# Patient Record
Sex: Male | Born: 1935 | Race: White | Hispanic: No | Smoking: Former smoker
Health system: Southern US, Community
[De-identification: ages and names within clinical notes are randomized; demographics above are authoritative.]

## PROBLEM LIST (undated history)

## (undated) DIAGNOSIS — M1990 Unspecified osteoarthritis, unspecified site: Secondary | ICD-10-CM

## (undated) DIAGNOSIS — E7800 Pure hypercholesterolemia, unspecified: Secondary | ICD-10-CM

## (undated) DIAGNOSIS — M810 Age-related osteoporosis without current pathological fracture: Secondary | ICD-10-CM

## (undated) DIAGNOSIS — K219 Gastro-esophageal reflux disease without esophagitis: Secondary | ICD-10-CM

## (undated) DIAGNOSIS — E538 Deficiency of other specified B group vitamins: Secondary | ICD-10-CM

## (undated) DIAGNOSIS — E039 Hypothyroidism, unspecified: Secondary | ICD-10-CM

## (undated) DIAGNOSIS — I10 Essential (primary) hypertension: Secondary | ICD-10-CM

## (undated) HISTORY — PX: BACK SURGERY: SHX140

## (undated) HISTORY — DX: Gastro-esophageal reflux disease without esophagitis: K21.9

## (undated) HISTORY — DX: Deficiency of other specified B group vitamins: E53.8

## (undated) HISTORY — DX: Age-related osteoporosis without current pathological fracture: M81.0

---

## 2011-01-11 DIAGNOSIS — H35379 Puckering of macula, unspecified eye: Secondary | ICD-10-CM | POA: Diagnosis not present

## 2011-01-11 DIAGNOSIS — H2510 Age-related nuclear cataract, unspecified eye: Secondary | ICD-10-CM | POA: Diagnosis not present

## 2011-01-21 ENCOUNTER — Encounter (HOSPITAL_COMMUNITY): Payer: Self-pay | Admitting: Pharmacy Technician

## 2011-01-27 ENCOUNTER — Other Ambulatory Visit: Payer: Self-pay

## 2011-01-27 ENCOUNTER — Encounter (HOSPITAL_COMMUNITY)
Admission: RE | Admit: 2011-01-27 | Discharge: 2011-01-27 | Disposition: A | Discharge status: Home / Self Care | Payer: Medicare Other | Source: Ambulatory Visit | Attending: Ophthalmology | Admitting: Ophthalmology

## 2011-01-27 ENCOUNTER — Encounter (HOSPITAL_COMMUNITY): Payer: Self-pay

## 2011-01-27 DIAGNOSIS — I10 Essential (primary) hypertension: Secondary | ICD-10-CM | POA: Diagnosis not present

## 2011-01-27 DIAGNOSIS — Z01812 Encounter for preprocedural laboratory examination: Secondary | ICD-10-CM | POA: Diagnosis not present

## 2011-01-27 DIAGNOSIS — H2510 Age-related nuclear cataract, unspecified eye: Secondary | ICD-10-CM | POA: Diagnosis not present

## 2011-01-27 DIAGNOSIS — Z79899 Other long term (current) drug therapy: Secondary | ICD-10-CM | POA: Diagnosis not present

## 2011-01-27 HISTORY — DX: Unspecified osteoarthritis, unspecified site: M19.90

## 2011-01-27 HISTORY — DX: Pure hypercholesterolemia, unspecified: E78.00

## 2011-01-27 HISTORY — DX: Hypothyroidism, unspecified: E03.9

## 2011-01-27 HISTORY — DX: Essential (primary) hypertension: I10

## 2011-01-27 LAB — BASIC METABOLIC PANEL
BUN: 15 mg/dL (ref 6–23)
Calcium: 9.9 mg/dL (ref 8.4–10.5)
GFR calc Af Amer: 77 mL/min — ABNORMAL LOW (ref >90)
GFR calc non Af Amer: 66 mL/min — ABNORMAL LOW (ref >90)
Glucose, Bld: 116 mg/dL — ABNORMAL HIGH (ref 70–99)
Potassium: 4.2 mEq/L (ref 3.5–5.1)
Sodium: 139 mEq/L (ref 135–145)

## 2011-01-27 LAB — HEMOGLOBIN AND HEMATOCRIT, BLOOD: Hemoglobin: 13.8 g/dL (ref 13.0–17.0)

## 2011-01-27 NOTE — Patient Instructions (Addendum)
Cataract A cataract is a clouding of the lens of the eye. It is most often related to aging. A cataract is not a "film" over the surface of the eye. The lens is inside the eye and changes size of the pupil. The lens can enlarge to let more light enter the eye in dark environments and contract the size of the pupil to let in bright light. The lens is the part of the eye that helps focus light on the retina. The retina is the eye's light-sensitive layer. It is in the back of the eye that sends visual signals to the brain. In a normal eye, light passes through the lens and gets focused on the retina. To help produce a sharp image, the lens must remain clear. When a lens becomes cloudy, vision is compromised by the degree and nature of the clouding. Certain cataracts make people more near-sighted as they develop, others increase glare, and all reduce vision to some degree or another. A cataract that is so dense that it becomes milky white and a white opacity can be seen through the pupil. When the white color is seen, it is called a "mature" or "hyper-mature cataract." Such cataracts cause total blindness in the affected eye. The cataract must be removed to prevent damage to the eye itself. Some types of cataracts can cause a secondary disease of the eye, such as certain types of glaucoma. In the early stages, better lighting and eyeglasses may lessen vision problems caused by cataracts. At a certain point, surgery may be needed to improve vision. CAUSES   Aging. However, cataracts may occur at any age, even in newborns.   Certain drugs.   Trauma to the eye.   Certain diseases (such as diabetes).   Inherited or acquired medical syndromes.  SYMPTOMS   Gradual, progressive drop in vision in the affected eye. Cataracts may develop at different rates in each eye. Cataracts may even be in just one eye with the other unaffected.   Cataracts due to trauma may develop quickly, sometimes over a matter or days  or even hours. The result is severe and rapid visual loss.  DIAGNOSIS  To detect a cataract, an eye doctor examines the lens. A well developed cataract can be diagnosed without dilating the pupil. Early cataracts and others of a specific nature are best diagnosed with an exam of the eyes with the pupils dilated by drops. TREATMENT   For an early cataract, vision may improve by using different eyeglasses or stronger lighting.   If the above measures do not help, surgery is the only effective treatment. This treatment removes the cloudy lens and replaces it with a substitute lens (Intraocular lens, or IOL). Newly developed IOL technology allows the implanted lens to improve vision both at a distance and up close. Discuss with your eye surgeon about the possibility of still needing glasses. Also discuss how visual coordination between both eyes will be affected.  A cataract needs to be removed only when vision loss interferes with your everyday activities such as driving, reading or watching TV. You and your eye doctor can make that decision together. In most cases, waiting until you are ready to have cataract surgery will not harm your eye. If you have cataracts in both eyes, only one should be removed at a time. This allows the operated eye to heal and be out of danger from serious problems (such as infection or poor wound healing) before having the other eye undergo surgery.  Sometimes, a cataract should be removed even if it does not cause problems with your vision. For example, a cataract should be removed if it prevents examination or treatment of another eye problem. Just as you cannot see out of the affected eye well, your doctor cannot see into your eye well through a cataract. The vast majority of people who have cataract surgery have better vision afterward. CATARACT REMOVAL There are two primary ways to remove a cataract. Your doctor can explain the differences and help determine which is best  for you:  Phacoemulsification (small incision cataract surgery). This involves making a small cut (incision) on the edge of the clear, dome-shaped surface that covers the front of the eye (the cornea). An injection behind the eye or eye drops are given to make this a painless procedure. The doctor then inserts a tiny probe into the eye. This device emits ultrasound waves that soften and break up the cloudy center of the lens so it can be removed by suction. Most cataract surgery is done this way. The cuts are usually so small and performed in such a manner that often no sutures are needed to keep it closed.   Extracapsular surgery. Your doctor makes a slightly longer incision on the side of the cornea. The doctor removes the hard center of the lens. The remainder of the lens is then removed by suction. In some cases, extremely fine sutures are needed which the doctor may, or may not remove in the office after the surgery.  When an IOL is implanted, it needs no care. It becomes a permanent part of your eye and cannot be seen or felt.  Some people cannot have an IOL. They may have problems during surgery, or maybe they have another eye disease. For these people, a soft contact lens may be suggested. If an IOL or contact lens cannot be used, very powerful and thick glasses are required after surgery. Since vision is very different through such thick glasses, it is important to have your doctor discuss the impact on20 James Thomas  01/27/2011   Your procedure is scheduled on: 02/01/2011  Report to [facility] at  630  AM.  Call this number if you have problems the morning of surgery: [phone]   Remember:   Do not eat food:After Midnight.  May have clear liquids:until Midnight .  Clear liquids include soda, tea, black coffee, apple or grape juice, broth.  Take these medicines the morning of surgery with A SIP OF WATER: norvasc,lotensin,allegra,synthroid   Do not wear jewelry, make-up or nail  polish.  Do not wear lotions, powders, or perfumes. You may wear deodorant.  Do not shave 48 hours prior to surgery.  Do not bring valuables to the hospital.  Contacts, dentures or bridgework may not be worn into surgery.  Leave suitcase in the car. After surgery it may be brought to your room.  For patients admitted to the hospital, checkout time is 11:00 AM the day of discharge.   Patients discharged the day of surgery will not be allowed to drive home.  Name and phone number of your driver: family  Special Instructions: N/A   Please read over the following fact sheets that you were given: Pain Booklet, Surgical Site Infection Prevention, Anesthesia Post-op Instructions and Care and Recovery After Surgery  your vision after any cataract surgery where there is no plan to implant an IOL. The normal lens of the eye is covered by a clear capsule. Both phacoemulsification  and extracapsular surgery require that the back surface of this lens capsule be left in place. This helps support IOLs and prevents the IOL from dislocating and falling back into the deeper interior of the eye. Right after surgery, and often permanently this "posterior capsule" remains clear. In some cases however, it can become cloudy, presenting the same type of visual compromise that the original cataract did since light is again obstructed as it passes through the clear IOL. This condition is often referred to as an "after-cataract." Fortunately, after-cataracts are easily treated using a painless and very fast laser treatment that is performed without anesthesia or incisions. It is done in a matter of minutes in an outpatient environment. Visual improvement is often immediate.  HOME CARE INSTRUCTIONS   Your surgeon will discuss pre and post operative care with you prior to surgery. The majority of people are able to do almost all normal activities right away. Although, it is often advised to avoid strenuous activity for a period  of time.   Postoperative drops and careful avoidance of infection will be needed. Many surgeons suggest the use of a protective shield during the first few days after surgery.   There is a very small incidence of complication from modern cataract surgery, but it can happen. Infection that spreads to the inside of the eye (endophthalmitis) can result in total visual loss and even loss of the eye itself. In extremely rare instances, the inflammation of endophthalmitis can spread to both eyes (sympathetic ophthalmia). Appropriate post-operative care under the close observation of your surgeon is essential to a successful outcome.  SEEK IMMEDIATE MEDICAL CARE IF:   You have any sudden drop of vision in the operated eye.   You have pain in the operated eye.   You see a large number of floating dots in the field of vision in the operated eye.   You see flashing lights, or if a portion of your side vision in any direction appears black (like a curtain being drawn into your field of vision) in the operated eye.  Document Released: 12/21/2004 Document Revised: 09/02/2010 Document Reviewed: 02/06/2007 [facility] Patient Information 2012 [place], [place].PATIENT INSTRUCTIONS POST-ANESTHESIA  IMMEDIATELY FOLLOWING SURGERY:  Do not drive or operate machinery for the first twenty four hours after surgery.  Do not make any important decisions for twenty four hours after surgery or while taking narcotic pain medications or sedatives.  If you develop intractable nausea and vomiting or a severe headache please notify your doctor immediately.  FOLLOW-UP:  Please make an appointment with your surgeon as instructed. You do not need to follow up with anesthesia unless specifically instructed to do so.  WOUND CARE INSTRUCTIONS (if applicable):  Keep a dry clean dressing on the anesthesia/puncture wound site if there is drainage.  Once the wound has quit draining you may leave it open to air.  Generally you should leave  the bandage intact for twenty four hours unless there is drainage.  If the epidural site drains for more than 36-48 hours please call the anesthesia department.  QUESTIONS?:  Please feel free to call your physician or the hospital operator if you have any questions, and they will be happy to assist you.     [facility] Anesthesia Department [address] [place] [phone]

## 2011-01-29 MED ORDER — LIDOCAINE HCL 3.5 % OP GEL
OPHTHALMIC | Status: AC
  Filled 2011-01-29: qty 5

## 2011-01-29 MED ORDER — NEOMYCIN-POLYMYXIN-DEXAMETH 3.5-10000-0.1 OP OINT
TOPICAL_OINTMENT | OPHTHALMIC | Status: AC
  Filled 2011-01-29: qty 3.5

## 2011-01-29 MED ORDER — CYCLOPENTOLATE-PHENYLEPHRINE 0.2-1 % OP SOLN
OPHTHALMIC | Status: AC
  Filled 2011-01-29: qty 2

## 2011-01-29 MED ORDER — LIDOCAINE HCL (PF) 1 % IJ SOLN
INTRAMUSCULAR | Status: AC
  Filled 2011-01-29: qty 2

## 2011-01-29 MED ORDER — TETRACAINE HCL 0.5 % OP SOLN
OPHTHALMIC | Status: AC
  Filled 2011-01-29: qty 2

## 2011-02-01 ENCOUNTER — Encounter (HOSPITAL_COMMUNITY): Payer: Self-pay | Admitting: Anesthesiology

## 2011-02-01 ENCOUNTER — Encounter (HOSPITAL_COMMUNITY): Payer: Self-pay | Admitting: *Deleted

## 2011-02-01 ENCOUNTER — Ambulatory Visit (HOSPITAL_COMMUNITY)
Admission: RE | Admit: 2011-02-01 | Discharge: 2011-02-01 | Disposition: A | Discharge status: Home / Self Care | Payer: Medicare Other | Source: Ambulatory Visit | Attending: Ophthalmology | Admitting: Ophthalmology

## 2011-02-01 ENCOUNTER — Encounter (HOSPITAL_COMMUNITY)
Admission: RE | Disposition: A | Discharge status: Home / Self Care | Payer: Self-pay | Source: Ambulatory Visit | Attending: Ophthalmology

## 2011-02-01 ENCOUNTER — Ambulatory Visit (HOSPITAL_COMMUNITY): Payer: Medicare Other | Admitting: Anesthesiology

## 2011-02-01 DIAGNOSIS — Z01812 Encounter for preprocedural laboratory examination: Secondary | ICD-10-CM | POA: Insufficient documentation

## 2011-02-01 DIAGNOSIS — H269 Unspecified cataract: Secondary | ICD-10-CM | POA: Diagnosis not present

## 2011-02-01 DIAGNOSIS — I10 Essential (primary) hypertension: Secondary | ICD-10-CM | POA: Insufficient documentation

## 2011-02-01 DIAGNOSIS — H2510 Age-related nuclear cataract, unspecified eye: Secondary | ICD-10-CM | POA: Insufficient documentation

## 2011-02-01 DIAGNOSIS — Z79899 Other long term (current) drug therapy: Secondary | ICD-10-CM | POA: Diagnosis not present

## 2011-02-01 HISTORY — PX: CATARACT EXTRACTION W/PHACO: SHX586

## 2011-02-01 SURGERY — PHACOEMULSIFICATION, CATARACT, WITH IOL INSERTION
Anesthesia: Monitor Anesthesia Care | Site: Eye | Laterality: Right | Wound class: Clean

## 2011-02-01 MED ORDER — BSS IO SOLN
INTRAOCULAR | Status: DC | PRN
  Administered 2011-02-01: 30 mL via INTRAOCULAR

## 2011-02-01 MED ORDER — LIDOCAINE HCL 3.5 % OP GEL
1.0000 | Freq: Once | OPHTHALMIC | Status: AC
Start: 2011-02-01 — End: 2011-02-01
  Administered 2011-02-01: 1 via OPHTHALMIC

## 2011-02-01 MED ORDER — EPINEPHRINE HCL 1 MG/ML IJ SOLN
INTRAMUSCULAR | Status: AC
  Filled 2011-02-01: qty 1

## 2011-02-01 MED ORDER — POVIDONE-IODINE 5 % OP SOLN
OPHTHALMIC | Status: DC | PRN
  Administered 2011-02-01: 1 via OPHTHALMIC

## 2011-02-01 MED ORDER — PROVISC 10 MG/ML IO SOLN
INTRAOCULAR | Status: DC | PRN
  Administered 2011-02-01: .85 mL via INTRAOCULAR

## 2011-02-01 MED ORDER — LACTATED RINGERS IV SOLN
INTRAVENOUS | Status: DC
  Administered 2011-02-01: 1000 mL via INTRAVENOUS

## 2011-02-01 MED ORDER — LIDOCAINE 3.5 % OP GEL OPTIME - NO CHARGE
OPHTHALMIC | Status: DC | PRN
  Administered 2011-02-01: 1 [drp] via OPHTHALMIC

## 2011-02-01 MED ORDER — PHENYLEPHRINE HCL 2.5 % OP SOLN
1.0000 [drp] | OPHTHALMIC | Status: AC
  Administered 2011-02-01 (×3): 1 [drp] via OPHTHALMIC

## 2011-02-01 MED ORDER — PHENYLEPHRINE HCL 2.5 % OP SOLN
OPHTHALMIC | Status: AC
  Administered 2011-02-01: 1 [drp] via OPHTHALMIC
  Filled 2011-02-01: qty 2

## 2011-02-01 MED ORDER — CYCLOPENTOLATE-PHENYLEPHRINE 0.2-1 % OP SOLN
1.0000 [drp] | OPHTHALMIC | Status: AC
  Administered 2011-02-01 (×3): 1 [drp] via OPHTHALMIC

## 2011-02-01 MED ORDER — EPINEPHRINE HCL 1 MG/ML IJ SOLN
INTRAMUSCULAR | Status: DC | PRN
  Administered 2011-02-01: 08:00:00

## 2011-02-01 MED ORDER — MIDAZOLAM HCL 2 MG/2ML IJ SOLN
1.0000 mg | INTRAMUSCULAR | Status: DC | PRN
  Administered 2011-02-01: 2 mg via INTRAVENOUS

## 2011-02-01 MED ORDER — TETRACAINE HCL 0.5 % OP SOLN
1.0000 [drp] | OPHTHALMIC | Status: AC
  Administered 2011-02-01 (×3): 1 [drp] via OPHTHALMIC

## 2011-02-01 MED ORDER — NEOMYCIN-POLYMYXIN-DEXAMETH 0.1 % OP OINT
TOPICAL_OINTMENT | OPHTHALMIC | Status: DC | PRN
  Administered 2011-02-01: 1 via OPHTHALMIC

## 2011-02-01 MED ORDER — LIDOCAINE HCL (PF) 1 % IJ SOLN
INTRAMUSCULAR | Status: DC | PRN
  Administered 2011-02-01: .5 mL

## 2011-02-01 MED ORDER — MIDAZOLAM HCL 2 MG/2ML IJ SOLN
INTRAMUSCULAR | Status: AC
  Administered 2011-02-01: 2 mg via INTRAVENOUS
  Filled 2011-02-01: qty 2

## 2011-02-01 SURGICAL SUPPLY — 31 items
CAPSULAR TENSION RING-AMO (OPHTHALMIC RELATED) IMPLANT
CLOTH BEACON ORANGE TIMEOUT ST (SAFETY) ×1 IMPLANT
EYE SHIELD UNIVERSAL CLEAR (GAUZE/BANDAGES/DRESSINGS) ×1 IMPLANT
GLOVE BIO SURGEON STRL SZ 6.5 (GLOVE) IMPLANT
GLOVE BIOGEL PI IND STRL 6.5 (GLOVE) IMPLANT
GLOVE BIOGEL PI IND STRL 7.0 (GLOVE) IMPLANT
GLOVE BIOGEL PI IND STRL 7.5 (GLOVE) IMPLANT
GLOVE BIOGEL PI INDICATOR 6.5 (GLOVE)
GLOVE BIOGEL PI INDICATOR 7.0 (GLOVE) ×1
GLOVE BIOGEL PI INDICATOR 7.5 (GLOVE)
GLOVE ECLIPSE 6.5 STRL STRAW (GLOVE) IMPLANT
GLOVE ECLIPSE 7.0 STRL STRAW (GLOVE) IMPLANT
GLOVE ECLIPSE 7.5 STRL STRAW (GLOVE) IMPLANT
GLOVE EXAM NITRILE LRG STRL (GLOVE) IMPLANT
GLOVE EXAM NITRILE MD LF STRL (GLOVE) ×1 IMPLANT
GLOVE SKINSENSE NS SZ6.5 (GLOVE)
GLOVE SKINSENSE NS SZ7.0 (GLOVE)
GLOVE SKINSENSE STRL SZ6.5 (GLOVE) IMPLANT
GLOVE SKINSENSE STRL SZ7.0 (GLOVE) IMPLANT
KIT VITRECTOMY (OPHTHALMIC RELATED) IMPLANT
PAD ARMBOARD 7.5X6 YLW CONV (MISCELLANEOUS) ×1 IMPLANT
PROC W NO LENS (INTRAOCULAR LENS)
PROC W SPEC LENS (INTRAOCULAR LENS)
PROCESS W NO LENS (INTRAOCULAR LENS) IMPLANT
PROCESS W SPEC LENS (INTRAOCULAR LENS) IMPLANT
RING MALYGIN (MISCELLANEOUS) IMPLANT
SIGHTPATH CAT PROC W REG LENS (Ophthalmic Related) ×2 IMPLANT
SYR TB 1ML LL NO SAFETY (SYRINGE) ×1 IMPLANT
TAPE CLOTH 1X10 TAN NS (GAUZE/BANDAGES/DRESSINGS) ×1 IMPLANT
VISCOELASTIC ADDITIONAL (OPHTHALMIC RELATED) IMPLANT
WATER STERILE IRR 250ML POUR (IV SOLUTION) ×1 IMPLANT

## 2011-02-01 NOTE — H&P (Signed)
I have reviewed the H&P, the patient was re-examined, and I have identified no interval changes in medical condition and plan of care since the history and physical of record  

## 2011-02-01 NOTE — Op Note (Signed)
NAMECARLON, James Thomas               ACCOUNT NO.:  [id number]  MEDICAL RECORD NO.:  [id number]  LOCATION:  [facility]                          FACILITY:  [facility]  PHYSICIAN:  Susanne Greenhouse, MD       DATE OF BIRTH:  [birth year]  DATE OF PROCEDURE:  02/01/2011 DATE OF DISCHARGE:                              OPERATIVE REPORT   PREOPERATIVE DIAGNOSIS:  Nuclear cataract, right eye, diagnosis code 366.16.  POSTOPERATIVE DIAGNOSIS:  Nuclear cataract, right eye, diagnosis code 366.16.  OPERATION PERFORMED:  Phacoemulsification with posterior chamber intraocular lens implantation, right eye.  SURGEON:  Bonne Dolores. Mabel Roll, MD  ANESTHESIA:  General endotracheal anesthesia.  OPERATIVE SUMMARY:  In the preoperative area, dilating drops were placed into the right eye.  The patient was then brought into the operating room where he was placed under general anesthesia.  The eye was then prepped and draped.  Beginning with a 75 blade, a paracentesis port was made at the surgeon's 2 o'clock position.  The anterior chamber was then filled with a 1% nonpreserved lidocaine solution with epinephrine.  This was followed by Viscoat to deepen the chamber.  A small fornix-based peritomy was performed superiorly.  Next, a single iris hook was placed through the limbus superiorly.  A 2.4-mm keratome blade was then used to make a clear corneal incision over the iris hook.  A bent cystotome needle and Utrata forceps were used to create a continuous tear capsulotomy.  Hydrodissection was performed using balanced salt solution on a fine cannula.  The lens nucleus was then removed using phacoemulsification in a quadrant cracking technique.  The cortical material was then removed with irrigation and aspiration.  The capsular bag and anterior chamber were refilled with Provisc.  The wound was widened to approximately 3 mm and a posterior chamber intraocular lens was placed into the capsular bag without difficulty using an  [employer] lens injecting system.  A single 10-0 nylon suture was then used to close the incision as well as stromal hydration.  The Provisc was removed from the anterior chamber and capsular bag with irrigation and aspiration.  At this point, the wounds were tested for leak, which were negative.  The anterior chamber remained deep and stable.  The patient tolerated the procedure well.  There were no operative complications, and he awoke from general anesthesia without problem.  No surgical specimens.  Prosthetic device used is a [employer]'s enVista posterior chamber lens, model Softec HD, MX60, power of 21.5, serial number is [id number].          ______________________________ Susanne Greenhouse, MD     KEH/MEDQ  D:  02/01/2011  T:  02/01/2011  Job:  [id number]

## 2011-02-01 NOTE — Anesthesia Postprocedure Evaluation (Signed)
  Anesthesia Post-op Note  Patient: James Thomas  Procedure(s) Performed:  CATARACT EXTRACTION PHACO AND INTRAOCULAR LENS PLACEMENT (IOC) - CDE:18.53  Patient Location: PACU  Anesthesia Type: MAC  Level of Consciousness: awake, alert  and oriented  Airway and Oxygen Therapy: Patient Spontanous Breathing  Post-op Pain: none  Post-op Assessment: Post-op Vital signs reviewed, Patient's Cardiovascular Status Stable, Respiratory Function Stable and No signs of Nausea or vomiting  Post-op Vital Signs: Reviewed and stable  Complications: No apparent anesthesia complications

## 2011-02-01 NOTE — Anesthesia Preprocedure Evaluation (Signed)
Anesthesia Evaluation  Patient identified by MRN, date of birth, ID band Patient awake    Reviewed: Allergy & Precautions, H&P , NPO status , Patient's Chart, lab work & pertinent test results  History of Anesthesia Complications Negative for: history of anesthetic complications  Airway Mallampati: II      Dental  (+) Edentulous Upper   Pulmonary neg pulmonary ROS,  clear to auscultation        Cardiovascular hypertension, Pt. on medications Regular Normal    Neuro/Psych    GI/Hepatic negative GI ROS,   Endo/Other  Hypothyroidism   Renal/GU      Musculoskeletal   Abdominal   Peds  Hematology   Anesthesia Other Findings   Reproductive/Obstetrics                           Anesthesia Physical Anesthesia Plan  ASA: II  Anesthesia Plan: MAC   Post-op Pain Management:    Induction: Intravenous  Airway Management Planned: Nasal Cannula  Additional Equipment:   Intra-op Plan:   Post-operative Plan:   Informed Consent: I have reviewed the patients History and Physical, chart, labs and discussed the procedure including the risks, benefits and alternatives for the proposed anesthesia with the patient or authorized representative who has indicated his/her understanding and acceptance.     Plan Discussed with:   Anesthesia Plan Comments:         Anesthesia Quick Evaluation

## 2011-02-01 NOTE — Brief Op Note (Signed)
Pre-Op Dx: Cataract OD Post-Op Dx: Cataract OD Surgeon: Kimberlie Csaszar Anesthesia: Topical with MAC Implant: B&L enVista Blood Loss: None Specimen: None Complications: None 

## 2011-02-01 NOTE — Transfer of Care (Signed)
Immediate Anesthesia Transfer of Care Note  Patient: James Thomas  Procedure(s) Performed:  CATARACT EXTRACTION PHACO AND INTRAOCULAR LENS PLACEMENT (IOC) - CDE:18.53  Patient Location: PACU and Short Stay  Anesthesia Type: MAC  Level of Consciousness: awake, alert  and oriented  Airway & Oxygen Therapy: Patient Spontanous Breathing  Post-op Assessment: Report given to PACU RN  Post vital signs: Reviewed and stable  Complications: No apparent anesthesia complications

## 2011-02-04 ENCOUNTER — Encounter (HOSPITAL_COMMUNITY): Payer: Self-pay | Admitting: Ophthalmology

## 2011-02-18 DIAGNOSIS — E785 Hyperlipidemia, unspecified: Secondary | ICD-10-CM | POA: Diagnosis not present

## 2011-02-18 DIAGNOSIS — E039 Hypothyroidism, unspecified: Secondary | ICD-10-CM | POA: Diagnosis not present

## 2011-02-18 DIAGNOSIS — I10 Essential (primary) hypertension: Secondary | ICD-10-CM | POA: Diagnosis not present

## 2011-02-18 DIAGNOSIS — E559 Vitamin D deficiency, unspecified: Secondary | ICD-10-CM | POA: Diagnosis not present

## 2011-08-20 DIAGNOSIS — I10 Essential (primary) hypertension: Secondary | ICD-10-CM | POA: Diagnosis not present

## 2011-08-20 DIAGNOSIS — E785 Hyperlipidemia, unspecified: Secondary | ICD-10-CM | POA: Diagnosis not present

## 2011-09-10 DIAGNOSIS — L570 Actinic keratosis: Secondary | ICD-10-CM | POA: Diagnosis not present

## 2011-09-10 DIAGNOSIS — D485 Neoplasm of uncertain behavior of skin: Secondary | ICD-10-CM | POA: Diagnosis not present

## 2011-09-10 DIAGNOSIS — L988 Other specified disorders of the skin and subcutaneous tissue: Secondary | ICD-10-CM | POA: Diagnosis not present

## 2011-09-10 DIAGNOSIS — L259 Unspecified contact dermatitis, unspecified cause: Secondary | ICD-10-CM | POA: Diagnosis not present

## 2011-09-10 DIAGNOSIS — L821 Other seborrheic keratosis: Secondary | ICD-10-CM | POA: Diagnosis not present

## 2011-10-21 DIAGNOSIS — S83006A Unspecified dislocation of unspecified patella, initial encounter: Secondary | ICD-10-CM | POA: Diagnosis not present

## 2011-10-28 DIAGNOSIS — Z23 Encounter for immunization: Secondary | ICD-10-CM | POA: Diagnosis not present

## 2012-02-16 DIAGNOSIS — Z125 Encounter for screening for malignant neoplasm of prostate: Secondary | ICD-10-CM | POA: Diagnosis not present

## 2012-02-16 DIAGNOSIS — E039 Hypothyroidism, unspecified: Secondary | ICD-10-CM | POA: Diagnosis not present

## 2012-02-16 DIAGNOSIS — I10 Essential (primary) hypertension: Secondary | ICD-10-CM | POA: Diagnosis not present

## 2012-02-16 DIAGNOSIS — E785 Hyperlipidemia, unspecified: Secondary | ICD-10-CM | POA: Diagnosis not present

## 2012-02-16 DIAGNOSIS — E559 Vitamin D deficiency, unspecified: Secondary | ICD-10-CM | POA: Diagnosis not present

## 2012-03-13 DIAGNOSIS — L259 Unspecified contact dermatitis, unspecified cause: Secondary | ICD-10-CM | POA: Diagnosis not present

## 2012-03-13 DIAGNOSIS — D485 Neoplasm of uncertain behavior of skin: Secondary | ICD-10-CM | POA: Diagnosis not present

## 2012-03-13 DIAGNOSIS — L738 Other specified follicular disorders: Secondary | ICD-10-CM | POA: Diagnosis not present

## 2012-03-13 DIAGNOSIS — L570 Actinic keratosis: Secondary | ICD-10-CM | POA: Diagnosis not present

## 2012-03-30 ENCOUNTER — Encounter: Payer: Self-pay | Admitting: *Deleted

## 2012-08-17 ENCOUNTER — Ambulatory Visit (INDEPENDENT_AMBULATORY_CARE_PROVIDER_SITE_OTHER): Payer: Medicare Other | Admitting: Family Medicine

## 2012-08-17 ENCOUNTER — Encounter: Payer: Self-pay | Admitting: Family Medicine

## 2012-08-17 ENCOUNTER — Ambulatory Visit: Payer: Self-pay | Admitting: General Practice

## 2012-08-17 VITALS — BP 142/77 | HR 70 | Temp 97.1°F | Ht 72.5 in | Wt 178.0 lb

## 2012-08-17 DIAGNOSIS — M810 Age-related osteoporosis without current pathological fracture: Secondary | ICD-10-CM | POA: Diagnosis not present

## 2012-08-17 DIAGNOSIS — J309 Allergic rhinitis, unspecified: Secondary | ICD-10-CM | POA: Diagnosis not present

## 2012-08-17 DIAGNOSIS — I10 Essential (primary) hypertension: Secondary | ICD-10-CM | POA: Diagnosis not present

## 2012-08-17 DIAGNOSIS — E785 Hyperlipidemia, unspecified: Secondary | ICD-10-CM

## 2012-08-17 DIAGNOSIS — J302 Other seasonal allergic rhinitis: Secondary | ICD-10-CM | POA: Insufficient documentation

## 2012-08-17 DIAGNOSIS — C44221 Squamous cell carcinoma of skin of unspecified ear and external auricular canal: Secondary | ICD-10-CM

## 2012-08-17 DIAGNOSIS — C44229 Squamous cell carcinoma of skin of left ear and external auricular canal: Secondary | ICD-10-CM | POA: Insufficient documentation

## 2012-08-17 DIAGNOSIS — E039 Hypothyroidism, unspecified: Secondary | ICD-10-CM | POA: Diagnosis not present

## 2012-08-17 NOTE — Patient Instructions (Signed)
Hypertriglyceridemia  Diet for High blood levels of Triglycerides Most fats in food are triglycerides. Triglycerides in your blood are stored as fat in your body. High levels of triglycerides in your blood may put you at a greater risk for heart disease and stroke.  Normal triglyceride levels are less than 150 mg/dL. Borderline high levels are 150-199 mg/dl. High levels are 200 - 499 mg/dL, and very high triglyceride levels are greater than 500 mg/dL. The decision to treat high triglycerides is generally based on the level. For people with borderline or high triglyceride levels, treatment includes weight loss and exercise. Drugs are recommended for people with very high triglyceride levels. Many people who need treatment for high triglyceride levels have metabolic syndrome. This syndrome is a collection of disorders that often include: insulin resistance, high blood pressure, blood clotting problems, high cholesterol and triglycerides. TESTING PROCEDURE FOR TRIGLYCERIDES  You should not eat 4 hours before getting your triglycerides measured. The normal range of triglycerides is between 10 and 250 milligrams per deciliter (mg/dl). Some people may have extreme levels (1000 or above), but your triglyceride level may be too high if it is above 150 mg/dl, depending on what other risk factors you have for heart disease.  People with high blood triglycerides may also have high blood cholesterol levels. If you have high blood cholesterol as well as high blood triglycerides, your risk for heart disease is probably greater than if you only had high triglycerides. High blood cholesterol is one of the main risk factors for heart disease. CHANGING YOUR DIET  Your weight can affect your blood triglyceride level. If you are more than 20% above your ideal body weight, you may be able to lower your blood triglycerides by losing weight. Eating less and exercising regularly is the best way to combat this. Fat provides more  calories than any other food. The best way to lose weight is to eat less fat. Only 30% of your total calories should come from fat. Less than 7% of your diet should come from saturated fat. A diet low in fat and saturated fat is the same as a diet to decrease blood cholesterol. By eating a diet lower in fat, you may lose weight, lower your blood cholesterol, and lower your blood triglyceride level.  Eating a diet low in fat, especially saturated fat, may also help you lower your blood triglyceride level. Ask your dietitian to help you figure how much fat you can eat based on the number of calories your caregiver has prescribed for you.  Exercise, in addition to helping with weight loss may also help lower triglyceride levels.   Alcohol can increase blood triglycerides. You may need to stop drinking alcoholic beverages.  Too much carbohydrate in your diet may also increase your blood triglycerides. Some complex carbohydrates are necessary in your diet. These may include bread, rice, potatoes, other starchy vegetables and cereals.  Reduce "simple" carbohydrates. These may include pure sugars, candy, honey, and jelly without losing other nutrients. If you have the kind of high blood triglycerides that is affected by the amount of carbohydrates in your diet, you will need to eat less sugar and less high-sugar foods. Your caregiver can help you with this.  Adding 2-4 grams of fish oil (EPA+ DHA) may also help lower triglycerides. Speak with your caregiver before adding any supplements to your regimen. Following the Diet  Maintain your ideal weight. Your caregivers can help you with a diet. Generally, eating less food and getting more   exercise will help you lose weight. Joining a weight control group may also help. Ask your caregivers for a good weight control group in your area.  Eat low-fat foods instead of high-fat foods. This can help you lose weight too.  These foods are lower in fat. Eat MORE of these:    Dried beans, peas, and lentils.  Egg whites.  Low-fat cottage cheese.  Fish.  Lean cuts of meat, such as round, sirloin, rump, and flank (cut extra fat off meat you fix).  Whole grain breads, cereals and pasta.  Skim and nonfat dry milk.  Low-fat yogurt.  Poultry without the skin.  Cheese made with skim or part-skim milk, such as mozzarella, parmesan, farmers', ricotta, or pot cheese. These are higher fat foods. Eat LESS of these:   Whole milk and foods made from whole milk, such as American, blue, cheddar, monterey jack, and swiss cheese  High-fat meats, such as luncheon meats, sausages, knockwurst, bratwurst, hot dogs, ribs, corned beef, ground pork, and regular ground beef.  Fried foods. Limit saturated fats in your diet. Substituting unsaturated fat for saturated fat may decrease your blood triglyceride level. You will need to read package labels to know which products contain saturated fats.  These foods are high in saturated fat. Eat LESS of these:   Fried pork skins.  Whole milk.  Skin and fat from poultry.  Palm oil.  Butter.  Shortening.  Cream cheese.  Bacon.  Margarines and baked goods made from listed oils.  Vegetable shortenings.  Chitterlings.  Fat from meats.  Coconut oil.  Palm kernel oil.  Lard.  Cream.  Sour cream.  Fatback.  Coffee whiteners and non-dairy creamers made with these oils.  Cheese made from whole milk. Use unsaturated fats (both polyunsaturated and monounsaturated) moderately. Remember, even though unsaturated fats are better than saturated fats; you still want a diet low in total fat.  These foods are high in unsaturated fat:   Canola oil.  Sunflower oil.  Mayonnaise.  Almonds.  Peanuts.  Pine nuts.  Margarines made with these oils.  Safflower oil.  Olive oil.  Avocados.  Cashews.  Peanut butter.  Sunflower seeds.  Soybean oil.  Peanut  oil.  Olives.  Pecans.  Walnuts.  Pumpkin seeds. Avoid sugar and other high-sugar foods. This will decrease carbohydrates without decreasing other nutrients. Sugar in your food goes rapidly to your blood. When there is excess sugar in your blood, your liver may use it to make more triglycerides. Sugar also contains calories without other important nutrients.  Eat LESS of these:   Sugar, brown sugar, powdered sugar, jam, jelly, preserves, honey, syrup, molasses, pies, candy, cakes, cookies, frosting, pastries, colas, soft drinks, punches, fruit drinks, and regular gelatin.  Avoid alcohol. Alcohol, even more than sugar, may increase blood triglycerides. In addition, alcohol is high in calories and low in nutrients. Ask for sparkling water, or a diet soft drink instead of an alcoholic beverage. Suggestions for planning and preparing meals   Bake, broil, grill or roast meats instead of frying.  Remove fat from meats and skin from poultry before cooking.  Add spices, herbs, lemon juice or vinegar to vegetables instead of salt, rich sauces or gravies.  Use a non-stick skillet without fat or use no-stick sprays.  Cool and refrigerate stews and broth. Then remove the hardened fat floating on the surface before serving.  Refrigerate meat drippings and skim off fat to make low-fat gravies.  Serve more fish.  Use less butter,   margarine and other high-fat spreads on bread or vegetables.  Use skim or reconstituted non-fat dry milk for cooking.  Cook with low-fat cheeses.  Substitute low-fat yogurt or cottage cheese for all or part of the sour cream in recipes for sauces, dips or congealed salads.  Use half yogurt/half mayonnaise in salad recipes.  Substitute evaporated skim milk for cream. Evaporated skim milk or reconstituted non-fat dry milk can be whipped and substituted for whipped cream in certain recipes.  Choose fresh fruits for dessert instead of high-fat foods such as pies or  cakes. Fruits are naturally low in fat. When Dining Out   Order low-fat appetizers such as fruit or vegetable juice, pasta with vegetables or tomato sauce.  Select clear, rather than cream soups.  Ask that dressings and gravies be served on the side. Then use less of them.  Order foods that are baked, broiled, poached, steamed, stir-fried, or roasted.  Ask for margarine instead of butter, and use only a small amount.  Drink sparkling water, unsweetened tea or coffee, or diet soft drinks instead of alcohol or other sweet beverages. QUESTIONS AND ANSWERS ABOUT OTHER FATS IN THE BLOOD: SATURATED FAT, TRANS FAT, AND CHOLESTEROL What is trans fat? Trans fat is a type of fat that is formed when vegetable oil is hardened through a process called hydrogenation. This process helps makes foods more solid, gives them shape, and prolongs their shelf life. Trans fats are also called hydrogenated or partially hydrogenated oils.  What do saturated fat, trans fat, and cholesterol in foods have to do with heart disease? Saturated fat, trans fat, and cholesterol in the diet all raise the level of LDL "bad" cholesterol in the blood. The higher the LDL cholesterol, the greater the risk for coronary heart disease (CHD). Saturated fat and trans fat raise LDL similarly.  What foods contain saturated fat, trans fat, and cholesterol? High amounts of saturated fat are found in animal products, such as fatty cuts of meat, chicken skin, and full-fat dairy products like butter, whole milk, cream, and cheese, and in tropical vegetable oils such as palm, palm kernel, and coconut oil. Trans fat is found in some of the same foods as saturated fat, such as vegetable shortening, some margarines (especially hard or stick margarine), crackers, cookies, baked goods, fried foods, salad dressings, and other processed foods made with partially hydrogenated vegetable oils. Small amounts of trans fat also occur naturally in some animal  products, such as milk products, beef, and lamb. Foods high in cholesterol include liver, other organ meats, egg yolks, shrimp, and full-fat dairy products. How can I use the new food label to make heart-healthy food choices? Check the Nutrition Facts panel of the food label. Choose foods lower in saturated fat, trans fat, and cholesterol. For saturated fat and cholesterol, you can also use the Percent Daily Value (%DV): 5% DV or less is low, and 20% DV or more is high. (There is no %DV for trans fat.) Use the Nutrition Facts panel to choose foods low in saturated fat and cholesterol, and if the trans fat is not listed, read the ingredients and limit products that list shortening or hydrogenated or partially hydrogenated vegetable oil, which tend to be high in trans fat. POINTS TO REMEMBER:   Discuss your risk for heart disease with your caregivers, and take steps to reduce risk factors.  Change your diet. Choose foods that are low in saturated fat, trans fat, and cholesterol.  Add exercise to your daily routine if   it is not already being done. Participate in physical activity of moderate intensity, like brisk walking, for at least 30 minutes on most, and preferably all days of the week. No time? Break the 30 minutes into three, 10-minute segments during the day.  Stop smoking. If you do smoke, contact your caregiver to discuss ways in which they can help you quit.  Do not use street drugs.  Maintain a normal weight.  Maintain a healthy blood pressure.  Keep up with your blood work for checking the fats in your blood as directed by your caregiver. Document Released: 10/09/2003 Document Revised: 06/22/2011 Document Reviewed: 05/06/2008 [facility] Patient Information 2014 [place], [place].   Hypertension As your heart beats, it forces blood through your arteries. This force is your blood pressure. If the pressure is too high, it is called hypertension (HTN) or high blood pressure. HTN is  dangerous because you may have it and not know it. High blood pressure may mean that your heart has to work harder to pump blood. Your arteries may be narrow or stiff. The extra work puts you at risk for heart disease, stroke, and other problems.  Blood pressure consists of two numbers, a higher number over a lower, 110/72, for example. It is stated as "110 over 72." The ideal is below 120 for the top number (systolic) and under 80 for the bottom (diastolic). Write down your blood pressure today. You should pay close attention to your blood pressure if you have certain conditions such as:  Heart failure.  Prior heart attack.  Diabetes  Chronic kidney disease.  Prior stroke.  Multiple risk factors for heart disease. To see if you have HTN, your blood pressure should be measured while you are seated with your arm held at the level of the heart. It should be measured at least twice. A one-time elevated blood pressure reading (especially in the Emergency Department) does not mean that you need treatment. There may be conditions in which the blood pressure is different between your right and left arms. It is important to see your caregiver soon for a recheck. Most people have essential hypertension which means that there is not a specific cause. This type of high blood pressure may be lowered by changing lifestyle factors such as:  Stress.  Smoking.  Lack of exercise.  Excessive weight.  Drug/tobacco/alcohol use.  Eating less salt. Most people do not have symptoms from high blood pressure until it has caused damage to the body. Effective treatment can often prevent, delay or reduce that damage. TREATMENT  When a cause has been identified, treatment for high blood pressure is directed at the cause. There are a large number of medications to treat HTN. These fall into several categories, and your caregiver will help you select the medicines that are best for you. Medications may have side  effects. You should review side effects with your caregiver. If your blood pressure stays high after you have made lifestyle changes or started on medicines,   Your medication(s) may need to be changed.  Other problems may need to be addressed.  Be certain you understand your prescriptions, and know how and when to take your medicine.  Be sure to follow up with your caregiver within the time frame advised (usually within two weeks) to have your blood pressure rechecked and to review your medications.  If you are taking more than one medicine to lower your blood pressure, make sure you know how and at what times they should  be taken. Taking two medicines at the same time can result in blood pressure that is too low. SEEK IMMEDIATE MEDICAL CARE IF:  You develop a severe headache, blurred or changing vision, or confusion.  You have unusual weakness or numbness, or a faint feeling.  You have severe chest or abdominal pain, vomiting, or breathing problems. MAKE SURE YOU:   Understand these instructions.  Will watch your condition.  Will get help right away if you are not doing well or get worse. Document Released: 12/21/2004 Document Revised: 03/15/2011 Document Reviewed: 08/11/2007 [facility] Patient Information 2014 [place], [place].     Colorectal Cancer Screening Colorectal cancer screening is done to detect early disease. Colorectal refers to the colon and rectum. The colon and rectum are located at the end of the large intestine (digestive system), and carry your bowel movements out of the body. Screening may be done even if you are not experiencing symptoms.  Colorectal cancer screening checks for:  Polyps. These are small growths in the lining of the colon that can turn cancerous.  Cancer that is already growing. Cancer is a cluster of abnormal cells that can cause problems in the body. REASONS FOR COLORECTAL CANCER SCREENING  It is common for polyps to form in the lining of  the colon, especially in older people. These polyps can be cancerous or become cancerous.  Caught early, colorectal cancer is treatable.  Cancer can be life threatening. Detecting or preventing cancer early can save your life and allow you to enjoy life longer. TYPES OF SCREENING  Fecal occult blood testing. A stool sample is examined for blood in the laboratory.  Sigmoidoscopy. A sigmoidoscope is used to examine the rectum and lower colon. A sigmoidoscope is a flexible tube with a camera that is inserted through your anus to examine your lower rectum.  Colonoscopy. The longer colonoscope is used to examine the entire colon. A colonoscope is also a thin, flexible tube with a camera. This test examines the colon and rectum. Other tests include:  Digital rectal exam.  Barium enema.  Stool DNA test.  Virtual colonoscopy is the use of computerized X-ray scan (computed tomography, CT) to take X-ray images of your colon. WHO SHOULD HAVE COLORECTAL CANCER SCREENING?  Screening is recommended for all adults aged 23 to 75 years.  Screening is generally done every 5 to 10 years or more frequently if you have a family history or symptoms.  Screening is rarely recommended in adults aged 72 to 85 years. Screening is not recommended in adults aged 61 years and older. Your caregiver may recommend screening at a younger age and more frequent screening if you have:  A history of colorectal cancer or polyps.  Family members with histories of colorectal cancer or polyps.  Inflammatory bowel disease, such as ulcerative colitis or Crohn's disease.  A type of hereditary colon cancer syndrome. Talk with your caregiver about any symptoms, personal and family history. SYMPTOMS OF COLORECTAL CANCER It is important to discuss the following symptoms with your caregiver. These symptoms may be the result of other conditions and may be easily treated:  Rectal bleeding.  Blood in your stool.  Changes in  bowel movements (hard or loose stools). These changes may last several weeks.  Abdominal cramping.  Feeling the pressure to have a bowel movement when there is no bowel movement.  Feeling tired or weak.  Unexplained weight loss.  Unexplained low red blood cell count. This may also be called iron deficiency anemia. HOME CARE  INSTRUCTIONS   Follow up with your caregiver as directed.  Follow all instructions for preparation before your test as well as after. PREVENTION  Following healthy lifestyle habits each day can reduce your chance of getting colorectal cancer and many other types of cancer:  Eat a healthy, well-balanced diet rich in fruits and vegetables and low in fats, sugars and cholesterol.  Stay active. Try to exercise at least 4 to 6 times per week for 30 minutes.  Maintain a healthy weight. Ask your caregiver what a healthy weight range is for you.  Women should only drink 1 alcoholic drink per day. Men should only drink 2 alcoholic drinks per day.  Quit smoking. SEEK MEDICAL CARE IF:   You experience abdominal or rectal symptoms (see Symptoms of Colorectal Cancer).  Your gastrointestinal issues (constipation, diarrhea) do not go away as expected.  You have questions or concerns. FOR MORE INFORMATION  American Academy of Family Physicians www.familydoctor.org  Centers for Disease Control and Prevention [employer]  [place] Preventive Services Task Force www.uspreventiveservicestaskforce.org  American Cancer Society www.cancer.org MAKE SURE YOU:   Understand these instructions.  Will watch your condition.  Will get help right away if you are not doing well or get worse. Always follow up with your caregiver to find out the results of your tests. Not all test results may be available during your visit. If your test results are not back during the visit, make an appointment with your caregiver to find out the results. Do not assume everything is normal if you have  not heard from your caregiver or the medical facility. It is important for you to follow up on all of your test results.  Document Released: 06/10/2009 Document Revised: 03/15/2011 Document Reviewed: 06/10/2009 [facility] Patient Information 2014 [place], [place].

## 2012-08-17 NOTE — Progress Notes (Signed)
Patient ID: James Thomas, male   DOB: 08/27/1934, 78 y.o.   MRN: [id number] SUBJECTIVE: CC: Chief Complaint  Patient presents with  . Follow-up    6 month follow up chronic problems  . Medication Refill    needs all refills     HPI: Patient is here for follow up of hyperlipidemia: denies Headache;denies Chest Pain;denies weakness;denies Shortness of Breath and orthopnea;denies Visual changes;denies palpitations;denies cough;denies pedal edema;denies symptoms of TIA or stroke;deniesClaudication symptoms. admits to Compliance with medications; denies Problems with medications.  Still works doing Environmental health practitioner work and  Restorations.   Past Medical History  Diagnosis Date  . Hypercholesteremia   . Hypothyroidism   . Hypertension   . Arthritis   . Osteoporosis    Past Surgical History  Procedure Laterality Date  . Back surgery  1979;1985    slipped disc;ruptured disc  . Cataract extraction w/phaco  02/01/2011    Procedure: CATARACT EXTRACTION PHACO AND INTRAOCULAR LENS PLACEMENT (IOC);  Surgeon: Gemma Payor, MD;  Location: [facility];  Service: Ophthalmology;  Laterality: Right;  CDE:18.53   History   Social History  . Marital Status: Married    Spouse Name: N/A    Number of Children: N/A  . Years of Education: N/A   Occupational History  . Not on file.   Social History Main Topics  . Smoking status: Former Smoker -- 1.00 packs/day for 10 years    Types: Cigarettes    Quit date: 01/27/1972  . Smokeless tobacco: Not on file  . Alcohol Use: No  . Drug Use: No  . Sexual Activity: No   Other Topics Concern  . Not on file   Social History Narrative  . No narrative on file   Family History  Problem Relation Age of Onset  . Anesthesia problems Neg Hx   . Hypotension Neg Hx   . Malignant hyperthermia Neg Hx   . Pseudochol deficiency Neg Hx    Current Outpatient Prescriptions on File Prior to Visit  Medication Sig Dispense Refill  . amLODipine (NORVASC) 10 MG tablet  Take 10 mg by mouth daily.      . benazepril (LOTENSIN) 20 MG tablet Take 20 mg by mouth daily.      . calcium citrate-vitamin D (CITRACAL+D) 315-200 MG-UNIT per tablet Take 1 tablet by mouth 2 (two) times daily.      . cholecalciferol (VITAMIN D) 1000 UNITS tablet Take 1,000 Units by mouth daily.      . fenofibrate (TRICOR) 145 MG tablet Take 145 mg by mouth daily.      . fexofenadine (ALLEGRA) 180 MG tablet Take 180 mg by mouth daily.      Marland Kitchen levothyroxine (SYNTHROID, LEVOTHROID) 50 MCG tablet Take 50 mcg by mouth daily.      . meloxicam (MOBIC) 15 MG tablet Take 7.5 mg by mouth daily.      . Omega-3 Fatty Acids (FISH OIL) 1200 MG CAPS Take 2 capsules by mouth daily.      . pravastatin (PRAVACHOL) 80 MG tablet Take 80 mg by mouth at bedtime.       No current facility-administered medications on file prior to visit.   Allergies  Allergen Reactions  . Niacin And Related Itching and Rash   Immunization History  Administered Date(s) Administered  . Tdap 08/18/2010  . Zoster 10/05/2006   Prior to Admission medications   Medication Sig Start Date End Date Taking? Authorizing Provider  amLODipine (NORVASC) 10 MG tablet Take 10 mg by  mouth daily.   Yes Historical Provider, MD  benazepril (LOTENSIN) 20 MG tablet Take 20 mg by mouth daily.   Yes Historical Provider, MD  calcium citrate-vitamin D (CITRACAL+D) 315-200 MG-UNIT per tablet Take 1 tablet by mouth 2 (two) times daily.   Yes Historical Provider, MD  cholecalciferol (VITAMIN D) 1000 UNITS tablet Take 1,000 Units by mouth daily.   Yes Historical Provider, MD  fenofibrate (TRICOR) 145 MG tablet Take 145 mg by mouth daily.   Yes Historical Provider, MD  fexofenadine (ALLEGRA) 180 MG tablet Take 180 mg by mouth daily.   Yes Historical Provider, MD  levothyroxine (SYNTHROID, LEVOTHROID) 50 MCG tablet Take 50 mcg by mouth daily.   Yes Historical Provider, MD  meloxicam (MOBIC) 15 MG tablet Take 7.5 mg by mouth daily.   Yes Historical  Provider, MD  Omega-3 Fatty Acids (FISH OIL) 1200 MG CAPS Take 2 capsules by mouth daily.   Yes Historical Provider, MD  pravastatin (PRAVACHOL) 80 MG tablet Take 80 mg by mouth at bedtime.   Yes Historical Provider, MD     ROS: As above in the HPI. All other systems are stable or negative.  OBJECTIVE: APPEARANCE:  Patient in no acute distress.The patient appeared well nourished and normally developed. Acyanotic. Waist: VITAL SIGNS:BP 142/77  Pulse 70  Temp(Src) 97.1 F (36.2 C) (Oral)  Ht 6' 0.5" (1.842 m)  Wt 178 lb (80.74 kg)  BMI 23.8 kg/m2 Elderly WM looks well for age.  SKIN: warm and  Dry without overt rashes, tattoos and scars  HEAD and Neck: without JVD, Head and scalp: normal Eyes:No scleral icterus. Fundi normal, eye movements normal. Ears: Auricle normal, canal normal, Tympanic membranes normal, insufflation normal. Nose: normal Throat: normal Neck & thyroid: normal  CHEST & LUNGS: Chest wall: normal Lungs: Clear  CVS: Reveals the PMI to be normally located. Regular rhythm, First and Second Heart sounds are normal,  absence of murmurs, rubs or gallops. Peripheral vasculature: Radial pulses: normal Dorsal pedis pulses: normal Posterior pulses: normal  ABDOMEN:  Appearance: normal Benign, no organomegaly, no masses, no Abdominal Aortic enlargement. No Guarding , no rebound. No Bruits. Bowel sounds: normal  RECTAL: N/A GU: N/A  EXTREMETIES: nonedematous. Both Femoral and Pedal pulses are normal.  MUSCULOSKELETAL:  Spine: normal Joints: intact  NEUROLOGIC: oriented to time,place and person; nonfocal. Strength is normal Sensory is normal Reflexes are normal Cranial Nerves are normal.  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 CMP14+EGFR  . NMR, lipoprofile  . TSH  . Vitamin D 25 hydroxy    Handouts on colon cancer screens , hyperlipidemia given in the AVS. Wellness discussion. Patient reluctant for any procedures.declines colon cancer screen. He has a history of osteoporosis and  Decline DEXA follow up  Same medications for now. Await results.  Patient declines the pneumovax until he comes in NOvember for the flushot. Return in about 4 months (around 12/17/2012) for Recheck medical problems.  Naleah Kofoed P. [place] Charon, M.D.

## 2012-08-19 LAB — CMP14+EGFR
ALT: 14 IU/L (ref 0–44)
AST: 20 IU/L (ref 0–40)
Albumin/Globulin Ratio: 2.2 (ref 1.1–2.5)
Albumin: 4.8 g/dL (ref 3.5–4.8)
Alkaline Phosphatase: 55 IU/L (ref 39–117)
BUN/Creatinine Ratio: 11 (ref 10–22)
BUN: 12 mg/dL (ref 8–27)
CO2: 24 mmol/L (ref 18–29)
Calcium: 9.7 mg/dL (ref 8.6–10.2)
Chloride: 104 mmol/L (ref 97–108)
Creatinine, Ser: 1.11 mg/dL (ref 0.76–1.27)
GFR calc Af Amer: 74 mL/min/{1.73_m2} (ref >59)
GFR calc non Af Amer: 64 mL/min/{1.73_m2} (ref >59)
Globulin, Total: 2.2 g/dL (ref 1.5–4.5)
Glucose: 99 mg/dL (ref 65–99)
Potassium: 5.1 mmol/L (ref 3.5–5.2)
Sodium: 143 mmol/L (ref 134–144)
Total Bilirubin: 0.6 mg/dL (ref 0.0–1.2)
Total Protein: 7 g/dL (ref 6.0–8.5)

## 2012-08-19 LAB — NMR, LIPOPROFILE
Cholesterol: 167 mg/dL (ref <200)
HDL Cholesterol by NMR: 44 mg/dL (ref >=40)
HDL Particle Number: 37.1 umol/L (ref >=30.5)
LDL Particle Number: 1722 nmol/L — ABNORMAL HIGH (ref <1000)
LDL Size: 21 nm (ref >20.5)
LDLC SERPL CALC-MCNC: 103 mg/dL — ABNORMAL HIGH (ref <100)
LP-IR Score: 57 — ABNORMAL HIGH (ref <=45)
Small LDL Particle Number: 931 nmol/L — ABNORMAL HIGH (ref <=527)
Triglycerides by NMR: 98 mg/dL (ref <150)

## 2012-08-19 LAB — VITAMIN D 25 HYDROXY (VIT D DEFICIENCY, FRACTURES): Vit D, 25-Hydroxy: 26.9 ng/mL — ABNORMAL LOW (ref 30.0–100.0)

## 2012-08-19 LAB — TSH: TSH: 2.68 u[IU]/mL (ref 0.450–4.500)

## 2012-09-13 DIAGNOSIS — L570 Actinic keratosis: Secondary | ICD-10-CM | POA: Diagnosis not present

## 2012-09-13 DIAGNOSIS — Z85828 Personal history of other malignant neoplasm of skin: Secondary | ICD-10-CM | POA: Diagnosis not present

## 2012-10-20 ENCOUNTER — Encounter (INDEPENDENT_AMBULATORY_CARE_PROVIDER_SITE_OTHER): Payer: Self-pay

## 2012-10-20 ENCOUNTER — Ambulatory Visit: Payer: Medicare Other | Admitting: *Deleted

## 2012-10-20 ENCOUNTER — Other Ambulatory Visit (INDEPENDENT_AMBULATORY_CARE_PROVIDER_SITE_OTHER): Payer: Medicare Other | Admitting: *Deleted

## 2012-10-20 DIAGNOSIS — Z23 Encounter for immunization: Secondary | ICD-10-CM

## 2012-12-21 ENCOUNTER — Ambulatory Visit (INDEPENDENT_AMBULATORY_CARE_PROVIDER_SITE_OTHER): Payer: Medicare Other | Admitting: Family Medicine

## 2012-12-21 ENCOUNTER — Encounter: Payer: Self-pay | Admitting: Family Medicine

## 2012-12-21 VITALS — BP 139/82 | HR 74 | Temp 97.0°F | Ht 72.5 in | Wt 177.5 lb

## 2012-12-21 DIAGNOSIS — E785 Hyperlipidemia, unspecified: Secondary | ICD-10-CM | POA: Diagnosis not present

## 2012-12-21 DIAGNOSIS — I10 Essential (primary) hypertension: Secondary | ICD-10-CM | POA: Diagnosis not present

## 2012-12-21 DIAGNOSIS — E039 Hypothyroidism, unspecified: Secondary | ICD-10-CM

## 2012-12-21 DIAGNOSIS — M810 Age-related osteoporosis without current pathological fracture: Secondary | ICD-10-CM

## 2012-12-21 DIAGNOSIS — C44229 Squamous cell carcinoma of skin of left ear and external auricular canal: Secondary | ICD-10-CM

## 2012-12-21 DIAGNOSIS — E559 Vitamin D deficiency, unspecified: Secondary | ICD-10-CM

## 2012-12-21 DIAGNOSIS — C44221 Squamous cell carcinoma of skin of unspecified ear and external auricular canal: Secondary | ICD-10-CM

## 2012-12-21 MED ORDER — FENOFIBRATE 145 MG PO TABS: 145.0000 mg | ORAL_TABLET | Freq: Every day | ORAL | Status: DC

## 2012-12-21 MED ORDER — LEVOTHYROXINE SODIUM 50 MCG PO TABS: 50.0000 ug | ORAL_TABLET | Freq: Every day | ORAL | Status: DC

## 2012-12-21 MED ORDER — BENAZEPRIL HCL 20 MG PO TABS: 20.0000 mg | ORAL_TABLET | Freq: Every day | ORAL | Status: DC

## 2012-12-21 MED ORDER — PRAVASTATIN SODIUM 80 MG PO TABS: 80.0000 mg | ORAL_TABLET | Freq: Every day | ORAL | Status: DC

## 2012-12-21 MED ORDER — AMLODIPINE BESYLATE 10 MG PO TABS: 10.0000 mg | ORAL_TABLET | Freq: Every day | ORAL | Status: DC

## 2012-12-21 NOTE — Patient Instructions (Signed)
DASH Diet The DASH diet stands for "Dietary Approaches to Stop Hypertension." It is a healthy eating plan that has been shown to reduce high blood pressure (hypertension) in as little as 14 days, while also possibly providing other significant health benefits. These other health benefits include reducing the risk of breast cancer after menopause and reducing the risk of type 2 diabetes, heart disease, colon cancer, and stroke. Health benefits also include weight loss and slowing kidney failure in patients with chronic kidney disease.  DIET GUIDELINES  Limit salt (sodium). Your diet should contain less than 1500 mg of sodium daily.  Limit refined or processed carbohydrates. Your diet should include mostly whole grains. Desserts and added sugars should be used sparingly.  Include small amounts of heart-healthy fats. These types of fats include nuts, oils, and tub margarine. Limit saturated and trans fats. These fats have been shown to be harmful in the body. CHOOSING FOODS  The following food groups are based on a 2000 calorie diet. See your Registered Dietitian for individual calorie needs. Grains and Grain Products (6 to 8 servings daily)  Eat More Often: Whole-wheat bread, brown rice, whole-grain or wheat pasta, quinoa, popcorn without added fat or salt (air popped).  Eat Less Often: White bread, white pasta, white rice, cornbread. Vegetables (4 to 5 servings daily)  Eat More Often: Fresh, frozen, and canned vegetables. Vegetables may be raw, steamed, roasted, or grilled with a minimal amount of fat.  Eat Less Often/Avoid: Creamed or fried vegetables. Vegetables in a cheese sauce. Fruit (4 to 5 servings daily)  Eat More Often: All fresh, canned (in natural juice), or frozen fruits. Dried fruits without added sugar. One hundred percent fruit juice ( cup [237 mL] daily).  Eat Less Often: Dried fruits with added sugar. Canned fruit in light or heavy syrup. Lean Meats, Fish, and Poultry (2  servings or less daily. One serving is 3 to 4 oz [85-114 g]).  Eat More Often: Ninety percent or leaner ground beef, tenderloin, sirloin. Round cuts of beef, chicken breast, turkey breast. All fish. Grill, bake, or broil your meat. Nothing should be fried.  Eat Less Often/Avoid: Fatty cuts of meat, turkey, or chicken leg, thigh, or wing. Fried cuts of meat or fish. Dairy (2 to 3 servings)  Eat More Often: Low-fat or fat-free milk, low-fat plain or light yogurt, reduced-fat or part-skim cheese.  Eat Less Often/Avoid: Milk (whole, 2%).Whole milk yogurt. Full-fat cheeses. Nuts, Seeds, and Legumes (4 to 5 servings per week)  Eat More Often: All without added salt.  Eat Less Often/Avoid: Salted nuts and seeds, canned beans with added salt. Fats and Sweets (limited)  Eat More Often: Vegetable oils, tub margarines without trans fats, sugar-free gelatin. Mayonnaise and salad dressings.  Eat Less Often/Avoid: Coconut oils, palm oils, butter, stick margarine, cream, half and half, cookies, candy, pie. FOR MORE INFORMATION The Dash Diet Eating Plan: www.dashdiet.org Document Released: 12/10/2010 Document Revised: 03/15/2011 Document Reviewed: 12/10/2010 Exit[employer] Patient Information 2014 [employer].    Hypercholesterolemia High Blood Cholesterol Cholesterol is a white, waxy, fat-like protein needed by your body in small amounts. The liver makes all the cholesterol you need. It is carried from the liver by the blood through the blood vessels. Deposits (plaque) may build up on blood vessel walls. This makes the arteries narrower and stiffer. Plaque increases the risk for heart attack and stroke. You cannot feel your cholesterol level even if it is very high. The only way to know is by a blood   test to check your lipid (fats) levels. Once you know your cholesterol levels, you should keep a record of the test results. Work with your caregiver to to keep your levels in the desired range. WHAT THE  RESULTS MEAN:  Total cholesterol is a rough measure of all the cholesterol in your blood.  LDL is the so-called bad cholesterol. This is the type that deposits cholesterol in the walls of the arteries. You want this level to be low.  HDL is the good cholesterol because it cleans the arteries and carries the LDL away. You want this level to be high.  Triglycerides are fat that the body can either burn for energy or store. High levels are closely linked to heart disease. DESIRED LEVELS:  Total cholesterol below 200.  LDL below 100 for people at risk, below 70 for very high risk.  HDL above 50 is good, above 60 is best.  Triglycerides below 150. HOW TO LOWER YOUR CHOLESTEROL:  Diet.  Choose fish or white meat chicken and turkey, roasted or baked. Limit fatty cuts of red meat, fried foods, and processed meats, such as sausage and lunch meat.  Eat lots of fresh fruits and vegetables. Choose whole grains, beans, pasta, potatoes and cereals.  Use only small amounts of olive, corn or canola oils. Avoid butter, mayonnaise, shortening or palm kernel oils. Avoid foods with trans-fats.  Use skim/nonfat milk and low-fat/nonfat yogurt and cheeses. Avoid whole milk, cream, ice cream, egg yolks and cheeses. Healthy desserts include angel food cake, gingersnaps, animal crackers, hard candy, popsicles, and low-fat/nonfat frozen yogurt. Avoid pastries, cakes, pies and cookies.  Exercise.  A regular program helps decrease LDL and raises HDL.  Helps with weight control.  Do things that increase your activity level like gardening, walking, or taking the stairs.  Medication.  May be prescribed by your caregiver to help lowering cholesterol and the risk for heart disease.  You may need medicine even if your levels are normal if you have several risk factors. HOME CARE INSTRUCTIONS   Follow your diet and exercise programs as suggested by your caregiver.  Take medications as directed.  Have  blood work done when your caregiver feels it is necessary. MAKE SURE YOU:   Understand these instructions.  Will watch your condition.  Will get help right away if you are not doing well or get worse. Document Released: 12/21/2004 Document Revised: 03/15/2011 Document Reviewed: 06/08/2006 Exit[employer] Patient Information 2014 [employer].  

## 2012-12-21 NOTE — Progress Notes (Signed)
Patient ID: James Thomas, male   DOB: Mar 18, 1934, 78 y.o.   MRN: [id number] SUBJECTIVE: CC: Chief Complaint  Patient presents with  . Follow-up    CHRONIC HEALTH REVIEW    HPI:   Patient is here for follow up of hyperlipidemia/HTN/Thyroid: denies Headache;denies Chest Pain;denies weakness;denies Shortness of Breath and orthopnea;denies Visual changes;denies palpitations;denies cough;denies pedal edema;denies symptoms of TIA or stroke;deniesClaudication symptoms. admits to Compliance with medications; denies Problems with medications.  Left knee worn out but it doesn't bother him much  Mostly vegetables. Past Medical History  Diagnosis Date  . Hypercholesteremia   . Hypothyroidism   . Hypertension   . Arthritis   . Osteoporosis    Past Surgical History  Procedure Laterality Date  . Back surgery  1979;1985    slipped disc;ruptured disc  . Cataract extraction w/phaco  02/01/2011    Procedure: CATARACT EXTRACTION PHACO AND INTRAOCULAR LENS PLACEMENT (IOC);  Surgeon: Gemma Payor, MD;  Location: [facility];  Service: Ophthalmology;  Laterality: Right;  CDE:18.53   History   Social History  . Marital Status: Married    Spouse Name: N/A    Number of Children: N/A  . Years of Education: N/A   Occupational History  . Not on file.   Social History Main Topics  . Smoking status: Former Smoker -- 1.00 packs/day for 10 years    Types: Cigarettes    Quit date: 01/27/1972  . Smokeless tobacco: Not on file  . Alcohol Use: No  . Drug Use: No  . Sexual Activity: No   Other Topics Concern  . Not on file   Social History Narrative  . No narrative on file   Family History  Problem Relation Age of Onset  . Anesthesia problems Neg Hx   . Hypotension Neg Hx   . Malignant hyperthermia Neg Hx   . Pseudochol deficiency Neg Hx    Current Outpatient Prescriptions on File Prior to Visit  Medication Sig Dispense Refill  . calcium citrate-vitamin D (CITRACAL+D) 315-200 MG-UNIT per  tablet Take 1 tablet by mouth 2 (two) times daily.      . cholecalciferol (VITAMIN D) 1000 UNITS tablet Take 1,000 Units by mouth daily.      . fexofenadine (ALLEGRA) 180 MG tablet Take 180 mg by mouth daily.      . Omega-3 Fatty Acids (FISH OIL) 1200 MG CAPS Take 2 capsules by mouth daily.       No current facility-administered medications on file prior to visit.   Allergies  Allergen Reactions  . Meloxicam     Unsure but thinks it gave him a rash  . Niacin And Related Itching and Rash   Immunization History  Administered Date(s) Administered  . Influenza,inj,Quad PF,36+ Mos 10/20/2012  . Pneumococcal Polysaccharide-23 10/20/2012  . Tdap 08/18/2010  . Zoster 10/05/2006   Prior to Admission medications   Medication Sig Start Date End Date Taking? Authorizing Provider  amLODipine (NORVASC) 10 MG tablet Take 10 mg by mouth daily.    Historical Provider, MD  benazepril (LOTENSIN) 20 MG tablet Take 20 mg by mouth daily.    Historical Provider, MD  calcium citrate-vitamin D (CITRACAL+D) 315-200 MG-UNIT per tablet Take 1 tablet by mouth 2 (two) times daily.    Historical Provider, MD  cholecalciferol (VITAMIN D) 1000 UNITS tablet Take 1,000 Units by mouth daily.    Historical Provider, MD  fenofibrate (TRICOR) 145 MG tablet Take 145 mg by mouth daily.    Historical Provider, MD  fexofenadine (ALLEGRA) 180 MG tablet Take 180 mg by mouth daily.    Historical Provider, MD  levothyroxine (SYNTHROID, LEVOTHROID) 50 MCG tablet Take 50 mcg by mouth daily.    Historical Provider, MD  meloxicam (MOBIC) 15 MG tablet Take 7.5 mg by mouth daily.    Historical Provider, MD  Omega-3 Fatty Acids (FISH OIL) 1200 MG CAPS Take 2 capsules by mouth daily.    Historical Provider, MD  pravastatin (PRAVACHOL) 80 MG tablet Take 80 mg by mouth at bedtime.    Historical Provider, MD     ROS: As above in the HPI. All other systems are stable or negative.  OBJECTIVE: APPEARANCE:  Patient in no acute  distress.The patient appeared well nourished and normally developed. Acyanotic. Waist: VITAL SIGNS:BP 139/82  Pulse 74  Temp(Src) 97 F (36.1 C) (Oral)  Ht 6' 0.5" (1.842 m)  Wt 177 lb 8 oz (80.513 kg)  BMI 23.73 kg/m2  WM Elderly. Looks good for age.  SKIN: warm and  Dry without overt rashes, tattoos and scars  HEAD and Neck: without JVD, Head and scalp: normal Eyes:No scleral icterus. Fundi normal, eye movements normal. Ears: Auricle normal, canal normal, Tympanic membranes normal, insufflation normal. Nose: normal Throat: normal Neck & thyroid: normal  CHEST & LUNGS: Chest wall: normal Lungs: Clear  CVS: Reveals the PMI to be normally located. Regular rhythm, First and Second Heart sounds are normal,  absence of murmurs, rubs or gallops. Peripheral vasculature: Radial pulses: normal Dorsal pedis pulses: normal Posterior pulses: normal  ABDOMEN:  Appearance: normal Benign, no organomegaly, no masses, no Abdominal Aortic enlargement. No Guarding , no rebound. No Bruits. Bowel sounds: normal  RECTAL: N/A GU: N/A  EXTREMETIES: nonedematous.  MUSCULOSKELETAL:  Spine: normal Joints: left knee crepitus  NEUROLOGIC: oriented to time,place and person; nonfocal. Strength is normal Sensory is normal Reflexes are normal Cranial Nerves are normal.  Results for orders placed in visit on 08/17/12  CMP14+EGFR      Result Value Range   Glucose 99  65 - 99 mg/dL   BUN 12  8 - 27 mg/dL   Creatinine, Ser 4.09  0.76 - 1.27 mg/dL   GFR calc non Af Amer 64  >59 mL/min/1.73   GFR calc Af Amer 74  >59 mL/min/1.73   BUN/Creatinine Ratio 11  10 - 22   Sodium 143  134 - 144 mmol/L   Potassium 5.1  3.5 - 5.2 mmol/L   Chloride 104  97 - 108 mmol/L   CO2 24  18 - 29 mmol/L   Calcium 9.7  8.6 - 10.2 mg/dL   Total Protein 7.0  6.0 - 8.5 g/dL   Albumin 4.8  3.5 - 4.8 g/dL   Globulin, Total 2.2  1.5 - 4.5 g/dL   Albumin/Globulin Ratio 2.2  1.1 - 2.5   Total Bilirubin 0.6   0.0 - 1.2 mg/dL   Alkaline Phosphatase 55  39 - 117 IU/L   AST 20  0 - 40 IU/L   ALT 14  0 - 44 IU/L  NMR, LIPOPROFILE      Result Value Range   LDL Particle Number [id number] (*) <1000 nmol/L   LDLC SERPL CALC-MCNC 103 (*) <100 mg/dL   HDL Cholesterol by NMR 44  >=40 mg/dL   Triglycerides by NMR 98  <150 mg/dL   Cholesterol 811  <914 mg/dL   HDL Particle Number [id number]  >=95.6 umol/L   Small LDL Particle Number [id number] (*) <=527 nmol/L   LDL  Size 21.0  >20.5 nm   LP-IR Score 57 (*) <=45  TSH      Result Value Range   TSH 2.680  0.450 - 4.500 uIU/mL  VITAMIN D 25 HYDROXY      Result Value Range   Vit D, 25-Hydroxy 26.9 (*) 30.0 - 100.0 ng/mL     ASSESSMENT:  HLD (hyperlipidemia) - Plan: CMP14+EGFR, Lipid panel, pravastatin (PRAVACHOL) 80 MG tablet, fenofibrate (TRICOR) 145 MG tablet  HTN (hypertension) - Plan: benazepril (LOTENSIN) 20 MG tablet, amLODipine (NORVASC) 10 MG tablet  Hypothyroid - Plan: TSH, levothyroxine (SYNTHROID, LEVOTHROID) 50 MCG tablet  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 In the AVS Keep active as he is doing and healthy diet which he apparently has.  Same medications for now Await lab work  Orders Placed This Encounter  Procedures  . CMP14+EGFR  . Lipid panel  . Vit D  25 hydroxy (rtn osteoporosis monitoring)  . TSH   Meds ordered this encounter  Medications  . pravastatin (PRAVACHOL) 80 MG tablet    Sig: Take 1 tablet (80 mg total) by mouth at bedtime.    Dispense:  30 tablet    Refill:  11  . levothyroxine (SYNTHROID, LEVOTHROID) 50 MCG tablet    Sig: Take 1 tablet (50 mcg total) by mouth daily.    Dispense:  30 tablet    Refill:  11  . fenofibrate (TRICOR) 145 MG tablet    Sig: Take 1 tablet (145 mg total) by mouth daily.    Dispense:  30 tablet    Refill:   11  . benazepril (LOTENSIN) 20 MG tablet    Sig: Take 1 tablet (20 mg total) by mouth daily.    Dispense:  30 tablet    Refill:  11  . amLODipine (NORVASC) 10 MG tablet    Sig: Take 1 tablet (10 mg total) by mouth daily.    Dispense:  30 tablet    Refill:  11   Medications Discontinued During This Encounter  Medication Reason  . meloxicam (MOBIC) 15 MG tablet Allergic reaction  . pravastatin (PRAVACHOL) 80 MG tablet Reorder  . levothyroxine (SYNTHROID, LEVOTHROID) 50 MCG tablet Reorder  . fenofibrate (TRICOR) 145 MG tablet Reorder  . benazepril (LOTENSIN) 20 MG tablet Reorder  . amLODipine (NORVASC) 10 MG tablet Reorder   Return in about 4 months (around 04/21/2013) for Recheck medical problems.  Dawn Convery P. [place] Charon, M.D.

## 2012-12-22 LAB — LIPID PANEL
Chol/HDL Ratio: 3.7 ratio units (ref 0.0–5.0)
Cholesterol, Total: 161 mg/dL (ref 100–199)
HDL: 44 mg/dL (ref >39)
LDL Calculated: 100 mg/dL — ABNORMAL HIGH (ref 0–99)
Triglycerides: 83 mg/dL (ref 0–149)
VLDL Cholesterol Cal: 17 mg/dL (ref 5–40)

## 2012-12-22 LAB — CMP14+EGFR
ALT: 14 IU/L (ref 0–44)
AST: 24 IU/L (ref 0–40)
Albumin/Globulin Ratio: 2 (ref 1.1–2.5)
Albumin: 4.6 g/dL (ref 3.5–4.8)
Alkaline Phosphatase: 54 IU/L (ref 39–117)
BUN/Creatinine Ratio: 12 (ref 10–22)
BUN: 14 mg/dL (ref 8–27)
CO2: 24 mmol/L (ref 18–29)
Calcium: 9.7 mg/dL (ref 8.6–10.2)
Chloride: 103 mmol/L (ref 97–108)
Creatinine, Ser: 1.14 mg/dL (ref 0.76–1.27)
GFR calc Af Amer: 72 mL/min/{1.73_m2} (ref >59)
GFR calc non Af Amer: 62 mL/min/{1.73_m2} (ref >59)
Globulin, Total: 2.3 g/dL (ref 1.5–4.5)
Glucose: 106 mg/dL — ABNORMAL HIGH (ref 65–99)
Potassium: 4.1 mmol/L (ref 3.5–5.2)
Sodium: 143 mmol/L (ref 134–144)
Total Bilirubin: 0.5 mg/dL (ref 0.0–1.2)
Total Protein: 6.9 g/dL (ref 6.0–8.5)

## 2012-12-22 LAB — VITAMIN D 25 HYDROXY (VIT D DEFICIENCY, FRACTURES): Vit D, 25-Hydroxy: 36.3 ng/mL (ref 30.0–100.0)

## 2012-12-22 LAB — TSH: TSH: 3.23 u[IU]/mL (ref 0.450–4.500)

## 2013-03-05 ENCOUNTER — Telehealth: Payer: Self-pay | Admitting: Family Medicine

## 2013-03-05 DIAGNOSIS — E785 Hyperlipidemia, unspecified: Secondary | ICD-10-CM

## 2013-03-05 DIAGNOSIS — E039 Hypothyroidism, unspecified: Secondary | ICD-10-CM

## 2013-03-05 MED ORDER — PRAVASTATIN SODIUM 80 MG PO TABS: 80.0000 mg | ORAL_TABLET | Freq: Every day | ORAL | Status: DC

## 2013-03-05 MED ORDER — LEVOTHYROXINE SODIUM 50 MCG PO TABS: 50.0000 ug | ORAL_TABLET | Freq: Every day | ORAL | Status: DC

## 2013-03-05 NOTE — Telephone Encounter (Signed)
done

## 2013-04-09 ENCOUNTER — Telehealth: Payer: Self-pay | Admitting: Family Medicine

## 2013-04-09 DIAGNOSIS — I10 Essential (primary) hypertension: Secondary | ICD-10-CM

## 2013-04-11 MED ORDER — BENAZEPRIL HCL 20 MG PO TABS: 20.0000 mg | ORAL_TABLET | Freq: Every day | ORAL | Status: DC

## 2013-04-11 MED ORDER — AMLODIPINE BESYLATE 10 MG PO TABS: 10.0000 mg | ORAL_TABLET | Freq: Every day | ORAL | Status: DC

## 2013-04-11 NOTE — Telephone Encounter (Signed)
Done pt aware °

## 2013-04-12 ENCOUNTER — Other Ambulatory Visit: Payer: Self-pay | Admitting: *Deleted

## 2013-04-12 DIAGNOSIS — E785 Hyperlipidemia, unspecified: Secondary | ICD-10-CM

## 2013-04-12 MED ORDER — FENOFIBRATE 145 MG PO TABS: 145.0000 mg | ORAL_TABLET | Freq: Every day | ORAL | Status: DC

## 2013-05-08 ENCOUNTER — Encounter: Payer: Self-pay | Admitting: Family Medicine

## 2013-05-08 ENCOUNTER — Ambulatory Visit (INDEPENDENT_AMBULATORY_CARE_PROVIDER_SITE_OTHER): Payer: Medicare Other | Admitting: Family Medicine

## 2013-05-08 VITALS — BP 150/73 | HR 70 | Temp 97.6°F | Ht 72.0 in | Wt 182.0 lb

## 2013-05-08 DIAGNOSIS — I10 Essential (primary) hypertension: Secondary | ICD-10-CM

## 2013-05-08 DIAGNOSIS — C189 Malignant neoplasm of colon, unspecified: Secondary | ICD-10-CM

## 2013-05-08 DIAGNOSIS — C44229 Squamous cell carcinoma of skin of left ear and external auricular canal: Secondary | ICD-10-CM

## 2013-05-08 DIAGNOSIS — C44221 Squamous cell carcinoma of skin of unspecified ear and external auricular canal: Secondary | ICD-10-CM

## 2013-05-08 DIAGNOSIS — M810 Age-related osteoporosis without current pathological fracture: Secondary | ICD-10-CM

## 2013-05-08 DIAGNOSIS — J302 Other seasonal allergic rhinitis: Secondary | ICD-10-CM

## 2013-05-08 DIAGNOSIS — J309 Allergic rhinitis, unspecified: Secondary | ICD-10-CM

## 2013-05-08 DIAGNOSIS — E785 Hyperlipidemia, unspecified: Secondary | ICD-10-CM

## 2013-05-08 DIAGNOSIS — E039 Hypothyroidism, unspecified: Secondary | ICD-10-CM

## 2013-05-08 NOTE — Progress Notes (Signed)
Patient ID: James Thomas, male   DOB: [birth year], 79 y.o.   MRN: [id number] SUBJECTIVE: CC: Chief Complaint  Patient presents with  . Hypothyroidism    4 month recheck  . Hypertension  . Hyperlipidemia    HPI: Patient is here for follow up of hyperlipidemia/HTN: denies Headache;denies Chest Pain;denies weakness;denies Shortness of Breath and orthopnea;denies Visual changes;denies palpitations;denies cough;denies pedal edema;denies symptoms of TIA or stroke;deniesClaudication symptoms. admits to Compliance with medications; denies Problems with medications.  Neck crackles and pops and some nights the left arm is numb, but not all the time.  No complaints. Feels well and is very active.  Past Medical History  Diagnosis Date  . Hypercholesteremia   . Hypothyroidism   . Hypertension   . Arthritis   . Osteoporosis    Past Surgical History  Procedure Laterality Date  . Back surgery  1979;1985    slipped disc;ruptured disc  . Cataract extraction w/phaco  02/01/2011    Procedure: CATARACT EXTRACTION PHACO AND INTRAOCULAR LENS PLACEMENT (IOC);  Surgeon: Tonny Branch, MD;  Location: [facility];  Service: Ophthalmology;  Laterality: Right;  CDE:18.53   History   Social History  . Marital Status: Married    Spouse Name: N/A    Number of Children: N/A  . Years of Education: N/A   Occupational History  . Not on file.   Social History Main Topics  . Smoking status: Former Smoker -- 1.00 packs/day for 10 years    Types: Cigarettes    Quit date: 01/27/1972  . Smokeless tobacco: Not on file  . Alcohol Use: No  . Drug Use: No  . Sexual Activity: No   Other Topics Concern  . Not on file   Social History Narrative  . No narrative on file   Family History  Problem Relation Age of Onset  . Anesthesia problems Neg Hx   . Hypotension Neg Hx   . Malignant hyperthermia Neg Hx   . Pseudochol deficiency Neg Hx    Current Outpatient Prescriptions on File Prior to Visit  Medication  Sig Dispense Refill  . amLODipine (NORVASC) 10 MG tablet Take 1 tablet (10 mg total) by mouth daily.  90 tablet  2  . benazepril (LOTENSIN) 20 MG tablet Take 1 tablet (20 mg total) by mouth daily.  90 tablet  2  . calcium citrate-vitamin D (CITRACAL+D) 315-200 MG-UNIT per tablet Take 1 tablet by mouth 2 (two) times daily.      . cholecalciferol (VITAMIN D) 1000 UNITS tablet Take 1,000 Units by mouth daily.      . fenofibrate (TRICOR) 145 MG tablet Take 1 tablet (145 mg total) by mouth daily.  90 tablet  2  . fexofenadine (ALLEGRA) 180 MG tablet Take 180 mg by mouth daily.      Marland Kitchen levothyroxine (SYNTHROID, LEVOTHROID) 50 MCG tablet Take 1 tablet (50 mcg total) by mouth daily.  90 tablet  0  . Omega-3 Fatty Acids (FISH OIL) 1200 MG CAPS Take 2 capsules by mouth daily.      . pravastatin (PRAVACHOL) 80 MG tablet Take 1 tablet (80 mg total) by mouth at bedtime.  90 tablet  0   No current facility-administered medications on file prior to visit.   Allergies  Allergen Reactions  . Meloxicam     Unsure but thinks it gave him a rash  . Niacin And Related Itching and Rash   Immunization History  Administered Date(s) Administered  . Influenza,inj,Quad PF,36+ Mos 10/20/2012  . Pneumococcal  Polysaccharide-23 10/20/2012  . Tdap 08/18/2010  . Zoster 10/05/2006   Prior to Admission medications   Medication Sig Start Date End Date Taking? Authorizing Provider  amLODipine (NORVASC) 10 MG tablet Take 1 tablet (10 mg total) by mouth daily. 04/11/13  Yes Chipper Herb, MD  benazepril (LOTENSIN) 20 MG tablet Take 1 tablet (20 mg total) by mouth daily. 04/11/13  Yes Chipper Herb, MD  calcium citrate-vitamin D (CITRACAL+D) 315-200 MG-UNIT per tablet Take 1 tablet by mouth 2 (two) times daily.   Yes Historical Provider, MD  cholecalciferol (VITAMIN D) 1000 UNITS tablet Take 1,000 Units by mouth daily.   Yes Historical Provider, MD  fenofibrate (TRICOR) 145 MG tablet Take 1 tablet (145 mg total) by mouth daily.  04/12/13  Yes Vernie Shanks, MD  fexofenadine (ALLEGRA) 180 MG tablet Take 180 mg by mouth daily.   Yes Historical Provider, MD  levothyroxine (SYNTHROID, LEVOTHROID) 50 MCG tablet Take 1 tablet (50 mcg total) by mouth daily. 03/05/13  Yes Vernie Shanks, MD  Omega-3 Fatty Acids (FISH OIL) 1200 MG CAPS Take 2 capsules by mouth daily.   Yes Historical Provider, MD  pravastatin (PRAVACHOL) 80 MG tablet Take 1 tablet (80 mg total) by mouth at bedtime. 03/05/13  Yes Vernie Shanks, MD  triamcinolone cream (KENALOG) 0.1 %  03/05/13   Historical Provider, MD     ROS: As above in the HPI. All other systems are stable or negative.  OBJECTIVE: APPEARANCE:  Patient in no acute distress.The patient appeared well nourished and normally developed. Acyanotic. Waist: VITAL SIGNS:BP 150/73  Pulse 70  Temp(Src) 97.6 F (36.4 C) (Oral)  Ht 6' (1.829 m)  Wt 182 lb (82.555 kg)  BMI 24.68 kg/m2 Well appearing WM  SKIN: warm and  Dry without overt rashes, tattoos and scars  HEAD and Neck: without JVD, Head and scalp: normal Eyes:No scleral icterus. Fundi normal, eye movements normal. Ears: Auricle normal, canal normal, Tympanic membranes normal, insufflation normal. Nose: normal Throat: normal Neck & thyroid: normal  CHEST & LUNGS: Chest wall: normal Lungs: Clear  CVS: Reveals the PMI to be normally located. Regular rhythm, First and Second Heart sounds are normal,  absence of murmurs, rubs or gallops. Peripheral vasculature: Radial pulses: normal Dorsal pedis pulses: normal Posterior pulses: normal  ABDOMEN:  Appearance: normal Benign, no organomegaly, no masses, no Abdominal Aortic enlargement. No Guarding , no rebound. No Bruits. Bowel sounds: normal  RECTAL: N/A GU: N/A  EXTREMETIES: nonedematous.  MUSCULOSKELETAL:  Spine: normal Joints: intact  NEUROLOGIC: oriented to time,place and person; nonfocal. Strength is normal Sensory is normal Reflexes are normal Cranial Nerves  are normal.  Results for orders placed in visit on 12/21/12  CMP14+EGFR      Result Value Ref Range   Glucose 106 (*) 65 - 99 mg/dL   BUN 14  8 - 27 mg/dL   Creatinine, Ser 1.14  0.76 - 1.27 mg/dL   GFR calc non Af Amer 62  >59 mL/min/1.73   GFR calc Af Amer 72  >59 mL/min/1.73   BUN/Creatinine Ratio 12  10 - 22   Sodium 143  134 - 144 mmol/L   Potassium 4.1  3.5 - 5.2 mmol/L   Chloride 103  97 - 108 mmol/L   CO2 24  18 - 29 mmol/L   Calcium 9.7  8.6 - 10.2 mg/dL   Total Protein 6.9  6.0 - 8.5 g/dL   Albumin 4.6  3.5 - 4.8 g/dL  Globulin, Total 2.3  1.5 - 4.5 g/dL   Albumin/Globulin Ratio 2.0  1.1 - 2.5   Total Bilirubin 0.5  0.0 - 1.2 mg/dL   Alkaline Phosphatase 54  39 - 117 IU/L   AST 24  0 - 40 IU/L   ALT 14  0 - 44 IU/L  LIPID PANEL      Result Value Ref Range   Cholesterol, Total 161  100 - 199 mg/dL   Triglycerides 83  0 - 149 mg/dL   HDL 44  >39 mg/dL   VLDL Cholesterol Cal 17  5 - 40 mg/dL   LDL Calculated 100 (*) 0 - 99 mg/dL   Chol/HDL Ratio 3.7  0.0 - 5.0 ratio units  VITAMIN D 25 HYDROXY      Result Value Ref Range   Vit D, 25-Hydroxy 36.3  30.0 - 100.0 ng/mL  TSH      Result Value Ref Range   TSH 3.230  0.450 - 4.500 uIU/mL    ASSESSMENT: Hypothyroid - Plan: TSH  HTN (hypertension) - Plan: CMP14+EGFR  HLD (hyperlipidemia) - Plan: Lipid panel  Seasonal allergic rhinitis  Osteoporosis, unspecified - Plan: Vit D  25 hydroxy (rtn osteoporosis monitoring)  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 Observe the neck for now since intermittent.  Dash diet in the AVs. Keep active. Continue to find purpose in life and healthy diet and purposeful activities. Declines colonoscopy again. Convinced him to do stool occult. Orders Placed This Encounter  Procedures  . Fecal occult blood, imunochemical  . CMP14+EGFR  . Lipid panel  . Vit D  25 hydroxy (rtn  osteoporosis monitoring)  . TSH   Meds ordered this encounter  Medications  . triamcinolone cream (KENALOG) 0.1 %    Sig:    There are no discontinued medications. Return in about 4 months (around 09/08/2013) for Recheck medical problems.  Haifa Hatton P. Jacelyn Grip, M.D.

## 2013-05-08 NOTE — Patient Instructions (Signed)
DASH Diet  The DASH diet stands for "Dietary Approaches to Stop Hypertension." It is a healthy eating plan that has been shown to reduce high blood pressure (hypertension) in as little as 14 days, while also possibly providing other significant health benefits. These other health benefits include reducing the risk of breast cancer after menopause and reducing the risk of type 2 diabetes, heart disease, colon cancer, and stroke. Health benefits also include weight loss and slowing kidney failure in patients with chronic kidney disease.   DIET GUIDELINES  · Limit salt (sodium). Your diet should contain less than 1500 mg of sodium daily.  · Limit refined or processed carbohydrates. Your diet should include mostly whole grains. Desserts and added sugars should be used sparingly.  · Include small amounts of heart-healthy fats. These types of fats include nuts, oils, and tub margarine. Limit saturated and trans fats. These fats have been shown to be harmful in the body.  CHOOSING FOODS   The following food groups are based on a 2000 calorie diet. See your Registered Dietitian for individual calorie needs.  Grains and Grain Products (6 to 8 servings daily)  · Eat More Often: Whole-wheat bread, brown rice, whole-grain or wheat pasta, quinoa, popcorn without added fat or salt (air popped).  · Eat Less Often: White bread, white pasta, white rice, cornbread.  Vegetables (4 to 5 servings daily)  · Eat More Often: Fresh, frozen, and canned vegetables. Vegetables may be raw, steamed, roasted, or grilled with a minimal amount of fat.  · Eat Less Often/Avoid: Creamed or fried vegetables. Vegetables in a cheese sauce.  Fruit (4 to 5 servings daily)  · Eat More Often: All fresh, canned (in natural juice), or frozen fruits. Dried fruits without added sugar. One hundred percent fruit juice (½ cup [237 mL] daily).  · Eat Less Often: Dried fruits with added sugar. Canned fruit in light or heavy syrup.  Lean Meats, Fish, and Poultry (2  servings or less daily. One serving is 3 to 4 oz [85-114 g]).  · Eat More Often: Ninety percent or leaner ground beef, tenderloin, sirloin. Round cuts of beef, chicken breast, turkey breast. All fish. Grill, bake, or broil your meat. Nothing should be fried.  · Eat Less Often/Avoid: Fatty cuts of meat, turkey, or chicken leg, thigh, or wing. Fried cuts of meat or fish.  Dairy (2 to 3 servings)  · Eat More Often: Low-fat or fat-free milk, low-fat plain or light yogurt, reduced-fat or part-skim cheese.  · Eat Less Often/Avoid: Milk (whole, 2%). Whole milk yogurt. Full-fat cheeses.  Nuts, Seeds, and Legumes (4 to 5 servings per week)  · Eat More Often: All without added salt.  · Eat Less Often/Avoid: Salted nuts and seeds, canned beans with added salt.  Fats and Sweets (limited)  · Eat More Often: Vegetable oils, tub margarines without trans fats, sugar-free gelatin. Mayonnaise and salad dressings.  · Eat Less Often/Avoid: Coconut oils, palm oils, butter, stick margarine, cream, half and half, cookies, candy, pie.  FOR MORE INFORMATION  The Dash Diet Eating Plan: www.dashdiet.org  Document Released: 12/10/2010 Document Revised: 03/15/2011 Document Reviewed: 12/10/2010  Exit[employer]® Patient Information ©2014 [employer].

## 2013-05-09 LAB — CMP14+EGFR
ALT: 14 IU/L (ref 0–44)
AST: 25 IU/L (ref 0–40)
Albumin/Globulin Ratio: 1.9 (ref 1.1–2.5)
Albumin: 4.6 g/dL (ref 3.5–4.8)
Alkaline Phosphatase: 54 IU/L (ref 39–117)
BUN/Creatinine Ratio: 13 (ref 10–22)
BUN: 15 mg/dL (ref 8–27)
CO2: 24 mmol/L (ref 18–29)
Calcium: 9.6 mg/dL (ref 8.6–10.2)
Chloride: 104 mmol/L (ref 97–108)
Creatinine, Ser: 1.13 mg/dL (ref 0.76–1.27)
GFR calc Af Amer: 73 mL/min/{1.73_m2} (ref >59)
GFR calc non Af Amer: 63 mL/min/{1.73_m2} (ref >59)
Globulin, Total: 2.4 g/dL (ref 1.5–4.5)
Glucose: 108 mg/dL — ABNORMAL HIGH (ref 65–99)
Potassium: 4.3 mmol/L (ref 3.5–5.2)
Sodium: 141 mmol/L (ref 134–144)
Total Bilirubin: 0.6 mg/dL (ref 0.0–1.2)
Total Protein: 7 g/dL (ref 6.0–8.5)

## 2013-05-09 LAB — LIPID PANEL
Chol/HDL Ratio: 4.4 ratio units (ref 0.0–5.0)
Cholesterol, Total: 161 mg/dL (ref 100–199)
HDL: 37 mg/dL — ABNORMAL LOW (ref >39)
LDL Calculated: 100 mg/dL — ABNORMAL HIGH (ref 0–99)
Triglycerides: 121 mg/dL (ref 0–149)
VLDL Cholesterol Cal: 24 mg/dL (ref 5–40)

## 2013-05-09 LAB — TSH: TSH: 2.74 u[IU]/mL (ref 0.450–4.500)

## 2013-05-09 LAB — VITAMIN D 25 HYDROXY (VIT D DEFICIENCY, FRACTURES): Vit D, 25-Hydroxy: 31.6 ng/mL (ref 30.0–100.0)

## 2013-05-14 ENCOUNTER — Telehealth: Payer: Self-pay | Admitting: *Deleted

## 2013-05-14 NOTE — Telephone Encounter (Signed)
Aware of lab results  

## 2013-05-24 ENCOUNTER — Other Ambulatory Visit: Payer: Medicare Other

## 2013-05-24 DIAGNOSIS — Z1212 Encounter for screening for malignant neoplasm of rectum: Secondary | ICD-10-CM

## 2013-05-25 LAB — FECAL OCCULT BLOOD, IMMUNOCHEMICAL: FECAL OCCULT BLD: NEGATIVE

## 2013-06-26 ENCOUNTER — Telehealth: Payer: Self-pay | Admitting: Family Medicine

## 2013-06-26 NOTE — Telephone Encounter (Signed)
Patient had a tick bite on Friday and the area around where the tick red but is not itching the patient. Patient advised to watch the are and if the red area gets larger or he starts have any fever, body aches, fatigue to please call the office to be seen

## 2013-09-14 ENCOUNTER — Ambulatory Visit: Payer: Medicare Other | Admitting: Family

## 2013-09-17 ENCOUNTER — Ambulatory Visit: Payer: Medicare Other | Admitting: Family

## 2013-10-05 ENCOUNTER — Telehealth: Payer: Self-pay | Admitting: Family Medicine

## 2013-10-05 ENCOUNTER — Ambulatory Visit (INDEPENDENT_AMBULATORY_CARE_PROVIDER_SITE_OTHER): Payer: Medicare Other | Admitting: Family Medicine

## 2013-10-05 ENCOUNTER — Encounter: Payer: Self-pay | Admitting: Family Medicine

## 2013-10-05 ENCOUNTER — Encounter (INDEPENDENT_AMBULATORY_CARE_PROVIDER_SITE_OTHER): Payer: Self-pay

## 2013-10-05 VITALS — BP 126/63 | HR 64 | Temp 97.3°F | Ht 72.0 in | Wt 184.0 lb

## 2013-10-05 DIAGNOSIS — Z23 Encounter for immunization: Secondary | ICD-10-CM | POA: Diagnosis not present

## 2013-10-05 DIAGNOSIS — E785 Hyperlipidemia, unspecified: Secondary | ICD-10-CM | POA: Diagnosis not present

## 2013-10-05 DIAGNOSIS — E039 Hypothyroidism, unspecified: Secondary | ICD-10-CM | POA: Diagnosis not present

## 2013-10-05 DIAGNOSIS — I10 Essential (primary) hypertension: Secondary | ICD-10-CM | POA: Diagnosis not present

## 2013-10-05 NOTE — Progress Notes (Signed)
   Subjective:    Patient ID: James Thomas, male    DOB: 03-Dec-1934, 79 y.o.   MRN: [id number]  HPI  79 year old gentleman here to follow up hypertension and hyperlipidemia. He was seen last May. Review of his labs at that time are all at goal and within normal limits. He denies any current problems except he does note that his knees get sore if he walks excessively. No side effects from medication.    Review of Systems  Constitutional: Negative.   HENT: Negative.   Eyes: Negative.   Respiratory: Negative.  Negative for shortness of breath.   Cardiovascular: Negative.  Negative for chest pain and leg swelling.  Gastrointestinal: Negative.   Genitourinary: Negative.   Musculoskeletal: Negative.   Skin: Negative.   Neurological: Negative.   Psychiatric/Behavioral: Negative.   All other systems reviewed and are negative.      Objective:   Physical Exam  Constitutional: He is oriented to person, place, and time. He appears well-developed and well-nourished.  HENT:  Head: Normocephalic.  Right Ear: External ear normal.  Left Ear: External ear normal.  Nose: Nose normal.  Mouth/Throat: Oropharynx is clear and moist.  Eyes: Conjunctivae and EOM are normal. Pupils are equal, round, and reactive to light.  Neck: Normal range of motion. Neck supple.  Cardiovascular: Normal rate, regular rhythm, normal heart sounds and intact distal pulses.   Murmur heard  Pulmonary/Chest: Effort normal and breath sounds normal.  Abdominal: Soft. Bowel sounds are normal.  Musculoskeletal: Normal range of motion.  Neurological: He is alert and oriented to person, place, and time.  Skin: Skin is warm and dry.  Psychiatric: He has a normal mood and affect. His behavior is normal. Judgment and thought content normal.     BP 126/63  Pulse 64  Temp(Src) 97.3 F (36.3 C) (Oral)  Ht 6' (1.829 m)  Wt 184 lb (83.462 kg)  BMI 24.95 kg/m2     Assessment & Plan:  1. Essential hypertension BP is  good  2. HLD (hyperlipidemia) At goal  3. Hypothyroidism, unspecified hypothyroidism type TSH is in normal range  4. Encounter for immunization Flu shot  Wardell Honour MD

## 2013-10-15 DIAGNOSIS — L570 Actinic keratosis: Secondary | ICD-10-CM | POA: Diagnosis not present

## 2013-10-15 DIAGNOSIS — D485 Neoplasm of uncertain behavior of skin: Secondary | ICD-10-CM | POA: Diagnosis not present

## 2013-10-15 DIAGNOSIS — L821 Other seborrheic keratosis: Secondary | ICD-10-CM | POA: Diagnosis not present

## 2013-10-15 DIAGNOSIS — Z85828 Personal history of other malignant neoplasm of skin: Secondary | ICD-10-CM | POA: Diagnosis not present

## 2013-12-06 DIAGNOSIS — M1712 Unilateral primary osteoarthritis, left knee: Secondary | ICD-10-CM | POA: Diagnosis not present

## 2014-01-14 ENCOUNTER — Other Ambulatory Visit: Payer: Self-pay | Admitting: Family Medicine

## 2014-01-15 ENCOUNTER — Other Ambulatory Visit: Payer: Self-pay | Admitting: *Deleted

## 2014-01-15 DIAGNOSIS — E785 Hyperlipidemia, unspecified: Secondary | ICD-10-CM

## 2014-01-15 MED ORDER — PRAVASTATIN SODIUM 80 MG PO TABS
80.0000 mg | ORAL_TABLET | Freq: Every day | ORAL | Status: DC
Start: 2014-01-15 — End: 2014-04-18

## 2014-02-12 ENCOUNTER — Telehealth: Payer: Self-pay | Admitting: Family Medicine

## 2014-02-12 DIAGNOSIS — E785 Hyperlipidemia, unspecified: Secondary | ICD-10-CM

## 2014-02-12 MED ORDER — FENOFIBRATE 145 MG PO TABS: 145.0000 mg | ORAL_TABLET | Freq: Every day | ORAL | Status: DC

## 2014-02-12 NOTE — Telephone Encounter (Signed)
done

## 2014-03-04 ENCOUNTER — Other Ambulatory Visit: Payer: Self-pay | Admitting: *Deleted

## 2014-03-04 MED ORDER — LEVOTHYROXINE SODIUM 50 MCG PO TABS: 50.0000 ug | ORAL_TABLET | Freq: Every day | ORAL | Status: DC

## 2014-04-18 ENCOUNTER — Ambulatory Visit (INDEPENDENT_AMBULATORY_CARE_PROVIDER_SITE_OTHER): Payer: Medicare Other | Admitting: Family Medicine

## 2014-04-18 ENCOUNTER — Encounter: Payer: Self-pay | Admitting: Family Medicine

## 2014-04-18 ENCOUNTER — Telehealth: Payer: Self-pay | Admitting: Family Medicine

## 2014-04-18 VITALS — BP 152/81 | HR 72 | Temp 97.1°F | Ht 72.0 in | Wt 187.0 lb

## 2014-04-18 DIAGNOSIS — I10 Essential (primary) hypertension: Secondary | ICD-10-CM | POA: Diagnosis not present

## 2014-04-18 DIAGNOSIS — E039 Hypothyroidism, unspecified: Secondary | ICD-10-CM

## 2014-04-18 DIAGNOSIS — E785 Hyperlipidemia, unspecified: Secondary | ICD-10-CM

## 2014-04-18 MED ORDER — AMLODIPINE BESYLATE 10 MG PO TABS: 10.0000 mg | ORAL_TABLET | Freq: Every day | ORAL | Status: DC

## 2014-04-18 MED ORDER — BENAZEPRIL HCL 20 MG PO TABS: 20.0000 mg | ORAL_TABLET | Freq: Every day | ORAL | Status: DC

## 2014-04-18 MED ORDER — PRAVASTATIN SODIUM 80 MG PO TABS: 80.0000 mg | ORAL_TABLET | Freq: Every day | ORAL | Status: DC

## 2014-04-18 NOTE — Patient Instructions (Signed)
Medicare Annual Wellness Visit  [place] and the medical providers at [facility] strive to bring you the best medical care.  In doing so we not only want to address your current medical conditions and concerns but also to detect new conditions early and prevent illness, disease and health-related problems.    Medicare offers a yearly Wellness Visit which allows our clinical staff to assess your need for preventative services including immunizations, lifestyle education, counseling to decrease risk of preventable diseases and screening for fall risk and other medical concerns.    This visit is provided free of charge (no copay) for all Medicare recipients. The clinical pharmacists at [facility] have begun to conduct these Wellness Visits which will also include a thorough review of all your medications.    As you primary medical provider recommend that you make an appointment for your Annual Wellness Visit if you have not done so already this year.  You may set up this appointment before you leave today or you may call back ([phone]) and schedule an appointment.  Please make sure when you call that you mention that you are scheduling your Annual Wellness Visit with the clinical pharmacist so that the appointment may be made for the proper length of time.     Continue current medications. Continue good therapeutic lifestyle changes which include good diet and exercise. Fall precautions discussed with patient. If an FOBT was given today- please return it to our front desk. If you are over 50 years old - you may need Prevnar 13 or the adult Pneumonia vaccine.  Flu Shots are still available at our office. If you still haven't had one please call to set up a nurse visit to get one.   After your visit with us today you will receive a survey in the mail or online from [employer] regarding your care with us. Please take a moment to  fill this out. Your feedback is very important to us as you can help us better understand your patient needs as well as improve your experience and satisfaction. WE CARE ABOUT YOU!!!   

## 2014-04-18 NOTE — Telephone Encounter (Signed)
Patient aware it will be up front for him to pick up

## 2014-04-18 NOTE — Progress Notes (Signed)
Subjective:    Patient ID: James Thomas, male    DOB: 1934/03/18, 80 y.o.   MRN: [id number]  HPI Pt here for follow up and management of chronic medical problems which includes hypothyroid, hypertension, and hyperlipidemia.  He reports feeling well today. He does have some occasional shortness of breath when he walks a distance or exerts himself. There is no shortness of breath at rest. There is no dependent edema, orthopnea or PND. There is no history of heart disease but he has a history of heart murmur since he was rejected for an [employer] physical.        Patient Active Problem List   Diagnosis Date Noted  . HTN (hypertension) 08/17/2012  . HLD (hyperlipidemia) 08/17/2012  . Hypothyroid 08/17/2012  . Seasonal allergic rhinitis 08/17/2012  . 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 Osteoporosis, unspecified 08/17/2012   Outpatient Encounter Prescriptions as of 04/18/2014  Medication Sig  . amLODipine (NORVASC) 10 MG tablet TAKE ONE TABLET BY MOUTH ONCE DAILY  . benazepril (LOTENSIN) 20 MG tablet TAKE ONE TABLET BY MOUTH ONCE DAILY  . calcium citrate-vitamin D (CITRACAL+D) 315-200 MG-UNIT per tablet Take 1 tablet by mouth 2 (two) times daily.  . cholecalciferol (VITAMIN D) 1000 UNITS tablet Take 1,000 Units by mouth daily.  . fenofibrate (TRICOR) 145 MG tablet Take 1 tablet (145 mg total) by mouth daily.  . fexofenadine (ALLEGRA) 180 MG tablet Take 180 mg by mouth daily.  Marland Kitchen glucosamine-chondroitin 500-400 MG tablet Take 2 tablets by mouth daily.  Marland Kitchen levothyroxine (SYNTHROID, LEVOTHROID) 50 MCG tablet Take 1 tablet (50 mcg total) by mouth daily.  . Omega-3 Fatty Acids (FISH OIL) 1200 MG CAPS Take 2 capsules by mouth daily.  . pravastatin (PRAVACHOL) 80 MG tablet Take 1 tablet (80 mg total) by mouth at bedtime.  . triamcinolone cream (KENALOG) 0.1 %     Review of Systems  Constitutional: Negative.   HENT: Negative.   Eyes: Negative.   Respiratory:  Positive for shortness of breath.   Cardiovascular: Negative.   Gastrointestinal: Negative.        Wears a hernia belt  Endocrine: Negative.   Genitourinary: Negative.   Musculoskeletal: Negative.   Skin: Negative.   Allergic/Immunologic: Negative.   Neurological: Negative.   Hematological: Negative.   Psychiatric/Behavioral: Negative.        Objective:   Physical Exam  Constitutional: He is oriented to person, place, and time. He appears well-developed and well-nourished.  Cardiovascular: Normal rate and regular rhythm.   Murmur heard. Short systolic murmur  Pulmonary/Chest: Effort normal and breath sounds normal.  Musculoskeletal: He exhibits no edema.  Neurological: He is alert and oriented to person, place, and time.   BP 152/81 mmHg  Pulse 72  Temp(Src) 97.1 F (36.2 C) (Oral)  Ht 6' (1.829 m)  Wt 187 lb (84.823 kg)  BMI 25.36 kg/m2  SpO2 100%        Assessment & Plan:  1. HLD (hyperlipidemia) Last time lipids were checked, he was at goal on 80 mg of pravastatin. He denies myalgias. If lipids are still in the same range we should continue statin treatment - pravastatin (PRAVACHOL) 80 MG tablet; Take 1 tablet (80 mg total) by mouth at bedtime.  Dispense: 90 tablet; Refill: 1 - Lipid panel  2. Essential hypertension Blood pressure is up slightly today compared to last visit but he tells me usually high in the mornings. I explained that this was related  to hormone production during sleep and it is usually higher in every body in the morning - CMP14+EGFR  3. Hypothyroidism, unspecified hypothyroidism type No symptoms to suggest inadequate replacement but will check TSH - TSH  Wardell Honour MD

## 2014-04-19 LAB — CMP14+EGFR
ALT: 17 IU/L (ref 0–44)
AST: 25 IU/L (ref 0–40)
Albumin/Globulin Ratio: 1.9 (ref 1.1–2.5)
Albumin: 4.7 g/dL (ref 3.5–4.8)
Alkaline Phosphatase: 54 IU/L (ref 39–117)
BUN / CREAT RATIO: 14 (ref 10–22)
BUN: 16 mg/dL (ref 8–27)
Bilirubin Total: 0.6 mg/dL (ref 0.0–1.2)
CALCIUM: 9.7 mg/dL (ref 8.6–10.2)
CO2: 25 mmol/L (ref 18–29)
Chloride: 105 mmol/L (ref 97–108)
Creatinine, Ser: 1.15 mg/dL (ref 0.76–1.27)
GFR calc Af Amer: 71 mL/min/{1.73_m2} (ref >59)
GFR, EST NON AFRICAN AMERICAN: 61 mL/min/{1.73_m2} (ref >59)
GLOBULIN, TOTAL: 2.5 g/dL (ref 1.5–4.5)
Glucose: 111 mg/dL — ABNORMAL HIGH (ref 65–99)
POTASSIUM: 4.3 mmol/L (ref 3.5–5.2)
SODIUM: 144 mmol/L (ref 134–144)
Total Protein: 7.2 g/dL (ref 6.0–8.5)

## 2014-04-19 LAB — LIPID PANEL
CHOL/HDL RATIO: 4 ratio (ref 0.0–5.0)
Cholesterol, Total: 172 mg/dL (ref 100–199)
HDL: 43 mg/dL (ref >39)
LDL Calculated: 110 mg/dL — ABNORMAL HIGH (ref 0–99)
Triglycerides: 96 mg/dL (ref 0–149)
VLDL Cholesterol Cal: 19 mg/dL (ref 5–40)

## 2014-04-19 LAB — TSH: TSH: 3.98 u[IU]/mL (ref 0.450–4.500)

## 2014-04-24 ENCOUNTER — Telehealth: Payer: Self-pay | Admitting: *Deleted

## 2014-04-24 NOTE — Telephone Encounter (Signed)
lmtcb regarding test results. 

## 2014-04-24 NOTE — Telephone Encounter (Signed)
-----   Message from Wardell Honour, MD sent at 04/23/2014  4:42 PM EDT ----- No change in any labs; continue same meds

## 2014-05-18 ENCOUNTER — Other Ambulatory Visit: Payer: Self-pay | Admitting: Family Medicine

## 2014-06-06 ENCOUNTER — Other Ambulatory Visit: Payer: Self-pay | Admitting: Family Medicine

## 2014-08-22 ENCOUNTER — Other Ambulatory Visit: Payer: Self-pay | Admitting: Family Medicine

## 2014-10-14 DIAGNOSIS — Z85828 Personal history of other malignant neoplasm of skin: Secondary | ICD-10-CM | POA: Diagnosis not present

## 2014-10-14 DIAGNOSIS — L570 Actinic keratosis: Secondary | ICD-10-CM | POA: Diagnosis not present

## 2014-10-16 ENCOUNTER — Other Ambulatory Visit: Payer: Self-pay | Admitting: Family Medicine

## 2014-10-17 ENCOUNTER — Other Ambulatory Visit: Payer: Self-pay | Admitting: Family Medicine

## 2014-10-22 ENCOUNTER — Encounter: Payer: Self-pay | Admitting: Family Medicine

## 2014-10-22 ENCOUNTER — Ambulatory Visit (INDEPENDENT_AMBULATORY_CARE_PROVIDER_SITE_OTHER): Payer: Medicare Other | Admitting: Family Medicine

## 2014-10-22 VITALS — BP 137/74 | HR 70 | Temp 97.1°F | Ht 72.0 in | Wt 189.6 lb

## 2014-10-22 DIAGNOSIS — Z23 Encounter for immunization: Secondary | ICD-10-CM | POA: Diagnosis not present

## 2014-10-22 DIAGNOSIS — E785 Hyperlipidemia, unspecified: Secondary | ICD-10-CM

## 2014-10-22 DIAGNOSIS — I10 Essential (primary) hypertension: Secondary | ICD-10-CM | POA: Diagnosis not present

## 2014-10-22 NOTE — Progress Notes (Signed)
   Subjective:    Patient ID: James Thomas, male    DOB: [birth year], 79 y.o.   MRN: [id number]  HPI  pathology   Review of Systems     Objective:   Physical Exam        Assessment & Plan:

## 2014-10-22 NOTE — Progress Notes (Signed)
   Subjective:    Patient ID: James Thomas, male    DOB: 25-Feb-1934, 80 y.o.   MRN: [id number]  HPI  80 year old gentleman here to follow-up blood pressure and lipid problems. He is doing well without complaints. He has been working full-time until recently. There are no side effects related to his blood pressure lipid medications area weight is stable.    Review of Systems  Constitutional: Negative.   HENT: Negative.   Eyes: Negative.   Respiratory: Negative.  Negative for shortness of breath.   Cardiovascular: Negative.  Negative for chest pain and leg swelling.  Gastrointestinal: Negative.   Genitourinary: Negative.   Musculoskeletal: Negative.   Skin: Negative.   Neurological: Negative.   Psychiatric/Behavioral: Negative.   All other systems reviewed and are negative.  Patient Active Problem List   Diagnosis Date Noted  . HTN (hypertension) 08/17/2012  . HLD (hyperlipidemia) 08/17/2012  . Hypothyroid 08/17/2012  . Seasonal allergic rhinitis 08/17/2012  . 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 Osteoporosis, unspecified 08/17/2012   Outpatient Encounter Prescriptions as of 10/22/2014  Medication Sig  . amLODipine (NORVASC) 10 MG tablet TAKE ONE TABLET BY MOUTH ONCE DAILY  . benazepril (LOTENSIN) 20 MG tablet TAKE ONE TABLET BY MOUTH ONCE DAILY  . calcium citrate-vitamin D (CITRACAL+D) 315-200 MG-UNIT per tablet Take 1 tablet by mouth 2 (two) times daily.  . cholecalciferol (VITAMIN D) 1000 UNITS tablet Take 1,000 Units by mouth daily.  . fenofibrate (TRICOR) 145 MG tablet TAKE ONE TABLET BY MOUTH ONCE DAILY  . fexofenadine (ALLEGRA) 180 MG tablet Take 180 mg by mouth daily.  Marland Kitchen glucosamine-chondroitin 500-400 MG tablet Take 2 tablets by mouth daily.  Marland Kitchen levothyroxine (SYNTHROID, LEVOTHROID) 50 MCG tablet TAKE ONE TABLET BY MOUTH ONCE DAILY  . Omega-3 Fatty Acids (FISH OIL) 1200 MG CAPS Take 2 capsules by mouth daily.  . pravastatin (PRAVACHOL)  80 MG tablet TAKE ONE TABLET BY MOUTH AT BEDTIME  . triamcinolone cream (KENALOG) 0.1 %    No facility-administered encounter medications on file as of 10/22/2014.       Objective:   Physical Exam  Constitutional: He is oriented to person, place, and time. He appears well-developed and well-nourished.  Cardiovascular: Normal rate and regular rhythm.   Pulmonary/Chest: Effort normal and breath sounds normal.  Neurological: He is alert and oriented to person, place, and time.  Psychiatric: He has a normal mood and affect. Thought content normal.          Assessment & Plan:  1Hypertension blood pressure well controlled continue same dose  2. HLD (hyperlipidemia) Lipids also your target. Tolerates medicine. Continue same dose and recheck in 6 months  Wardell Honour MD

## 2014-12-17 DIAGNOSIS — Z961 Presence of intraocular lens: Secondary | ICD-10-CM | POA: Diagnosis not present

## 2014-12-17 DIAGNOSIS — H5203 Hypermetropia, bilateral: Secondary | ICD-10-CM | POA: Diagnosis not present

## 2014-12-17 DIAGNOSIS — H25812 Combined forms of age-related cataract, left eye: Secondary | ICD-10-CM | POA: Diagnosis not present

## 2014-12-17 DIAGNOSIS — H43813 Vitreous degeneration, bilateral: Secondary | ICD-10-CM | POA: Diagnosis not present

## 2015-01-15 ENCOUNTER — Other Ambulatory Visit: Payer: Self-pay | Admitting: Family Medicine

## 2015-01-17 ENCOUNTER — Other Ambulatory Visit: Payer: Self-pay | Admitting: Family Medicine

## 2015-01-28 DIAGNOSIS — M1712 Unilateral primary osteoarthritis, left knee: Secondary | ICD-10-CM | POA: Diagnosis not present

## 2015-02-21 ENCOUNTER — Other Ambulatory Visit: Payer: Self-pay | Admitting: *Deleted

## 2015-02-21 ENCOUNTER — Telehealth: Payer: Self-pay | Admitting: Family Medicine

## 2015-02-21 MED ORDER — FENOFIBRATE 145 MG PO TABS: 145.0000 mg | ORAL_TABLET | Freq: Every day | ORAL | Status: DC

## 2015-02-21 NOTE — Telephone Encounter (Signed)
Patient called about wrong medicine. Disregard.

## 2015-02-21 NOTE — Telephone Encounter (Signed)
error 

## 2015-03-07 ENCOUNTER — Other Ambulatory Visit: Payer: Self-pay | Admitting: Family

## 2015-04-22 ENCOUNTER — Encounter: Payer: Self-pay | Admitting: Family Medicine

## 2015-04-22 ENCOUNTER — Ambulatory Visit (INDEPENDENT_AMBULATORY_CARE_PROVIDER_SITE_OTHER): Payer: Medicare Other | Admitting: Family Medicine

## 2015-04-22 VITALS — BP 151/78 | HR 81 | Temp 97.0°F | Ht 72.0 in | Wt 191.0 lb

## 2015-04-22 DIAGNOSIS — E785 Hyperlipidemia, unspecified: Secondary | ICD-10-CM

## 2015-04-22 DIAGNOSIS — I10 Essential (primary) hypertension: Secondary | ICD-10-CM | POA: Diagnosis not present

## 2015-04-22 DIAGNOSIS — E039 Hypothyroidism, unspecified: Secondary | ICD-10-CM | POA: Diagnosis not present

## 2015-04-22 MED ORDER — LEVOTHYROXINE SODIUM 50 MCG PO TABS: 50.0000 ug | ORAL_TABLET | Freq: Every day | ORAL | Status: DC

## 2015-04-22 MED ORDER — BENAZEPRIL HCL 20 MG PO TABS: 20.0000 mg | ORAL_TABLET | Freq: Every day | ORAL | Status: DC

## 2015-04-22 MED ORDER — FENOFIBRATE 145 MG PO TABS: 145.0000 mg | ORAL_TABLET | Freq: Every day | ORAL | Status: DC

## 2015-04-22 MED ORDER — AMLODIPINE BESYLATE 10 MG PO TABS: 10.0000 mg | ORAL_TABLET | Freq: Every day | ORAL | Status: DC

## 2015-04-22 MED ORDER — LOSARTAN POTASSIUM 50 MG PO TABS: 50.0000 mg | ORAL_TABLET | Freq: Every day | ORAL | Status: DC

## 2015-04-22 NOTE — Patient Instructions (Signed)
Medicare Annual Wellness Visit  [place] and the medical providers at [facility] strive to bring you the best medical care.  In doing so we not only want to address your current medical conditions and concerns but also to detect new conditions early and prevent illness, disease and health-related problems.    Medicare offers a yearly Wellness Visit which allows our clinical staff to assess your need for preventative services including immunizations, lifestyle education, counseling to decrease risk of preventable diseases and screening for fall risk and other medical concerns.    This visit is provided free of charge (no copay) for all Medicare recipients. The clinical pharmacists at [facility] have begun to conduct these Wellness Visits which will also include a thorough review of all your medications.    As you primary medical provider recommend that you make an appointment for your Annual Wellness Visit if you have not done so already this year.  You may set up this appointment before you leave today or you may call back ([phone]) and schedule an appointment.  Please make sure when you call that you mention that you are scheduling your Annual Wellness Visit with the clinical pharmacist so that the appointment may be made for the proper length of time.     Continue current medications. Continue good therapeutic lifestyle changes which include good diet and exercise. Fall precautions discussed with patient. If an FOBT was given today- please return it to our front desk. If you are over 50 years old - you may need Prevnar 13 or the adult Pneumonia vaccine.  **Flu shots are available--- please call and schedule a FLU-CLINIC appointment**  After your visit with us today you will receive a survey in the mail or online from [employer] regarding your care with us. Please take a moment to fill this out. Your feedback is very  important to us as you can help us better understand your patient needs as well as improve your experience and satisfaction. WE CARE ABOUT YOU!!!    

## 2015-04-22 NOTE — Progress Notes (Signed)
   Subjective:    Patient ID: James Thomas, male    DOB: 02/05/1934, 81 y.o.   MRN: [id number]  HPI Pt here for follow up and management of chronic medical problems which includes hypothyroid, hypertension, and hyperlipidemia. He is taking medications regularly. He did stop his statin. He was experiencing pain in his left arm mostly at night and once the statin was stopped pain resolved. I would wonder in retrospect if there was a causal relationship      Patient Active Problem List   Diagnosis Date Noted  . HTN (hypertension) 08/17/2012  . HLD (hyperlipidemia) 08/17/2012  . Hypothyroid 08/17/2012  . Seasonal allergic rhinitis 08/17/2012  . 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 Osteoporosis, unspecified 08/17/2012   Outpatient Encounter Prescriptions as of 04/22/2015  Medication Sig  . amLODipine (NORVASC) 10 MG tablet TAKE ONE TABLET BY MOUTH ONCE DAILY  . benazepril (LOTENSIN) 20 MG tablet TAKE ONE TABLET BY MOUTH ONCE DAILY  . calcium citrate-vitamin D (CITRACAL+D) 315-200 MG-UNIT per tablet Take 1 tablet by mouth 2 (two) times daily.  . cholecalciferol (VITAMIN D) 1000 UNITS tablet Take 1,000 Units by mouth daily.  . fenofibrate (TRICOR) 145 MG tablet Take 1 tablet (145 mg total) by mouth daily.  . fexofenadine (ALLEGRA) 180 MG tablet Take 180 mg by mouth daily.  Marland Kitchen glucosamine-chondroitin 500-400 MG tablet Take 2 tablets by mouth daily.  Marland Kitchen levothyroxine (SYNTHROID, LEVOTHROID) 50 MCG tablet TAKE ONE TABLET BY MOUTH ONCE DAILY  . Omega-3 Fatty Acids (FISH OIL) 1200 MG CAPS Take 2 capsules by mouth daily.  . [DISCONTINUED] pravastatin (PRAVACHOL) 80 MG tablet TAKE ONE TABLET BY MOUTH AT BEDTIME  . [DISCONTINUED] triamcinolone cream (KENALOG) 0.1 %    No facility-administered encounter medications on file as of 04/22/2015.      Review of Systems  Constitutional: Negative.   HENT: Negative.   Eyes: Negative.   Respiratory: Negative.     Cardiovascular: Negative.   Gastrointestinal: Negative.   Endocrine: Negative.   Genitourinary: Negative.   Musculoskeletal: Positive for arthralgias (left foot pain - getting better).  Skin: Negative.   Allergic/Immunologic: Negative.   Neurological: Negative.   Hematological: Negative.   Psychiatric/Behavioral: Negative.        Objective:   Physical Exam  Constitutional: He is oriented to person, place, and time. He appears well-developed and well-nourished.  Cardiovascular: Normal rate and regular rhythm.   Pulmonary/Chest: Effort normal and breath sounds normal.  Musculoskeletal:  After fall 3 weeks ago, still walks with a slight limp although he denies pain in the left foot  Neurological: He is alert and oriented to person, place, and time.   BP 151/78 mmHg  Pulse 81  Temp(Src) 97 F (36.1 C) (Oral)  Ht 6' (1.829 m)  Wt 191 lb (86.637 kg)  BMI 25.90 kg/m2        Assessment & Plan:  1. Essential hypertension Has been conserving his blood pressure pills and it is consequently elevated today. He does complain of some cough. Will try switching from benazepril to losartan - CMP14+EGFR  2. HLD (hyperlipidemia) Continues on fenofibrate but now off pravastatin - Lipid panel  3. Hypothyroidism, unspecified hypothyroidism type TSH was at goal one year ago. - TSH

## 2015-04-23 LAB — LIPID PANEL
CHOLESTEROL TOTAL: 230 mg/dL — AB (ref 100–199)
Chol/HDL Ratio: 5.6 ratio units — ABNORMAL HIGH (ref 0.0–5.0)
HDL: 41 mg/dL (ref >39)
LDL CALC: 165 mg/dL — AB (ref 0–99)
TRIGLYCERIDES: 120 mg/dL (ref 0–149)
VLDL CHOLESTEROL CAL: 24 mg/dL (ref 5–40)

## 2015-04-23 LAB — TSH: TSH: 4.87 u[IU]/mL — ABNORMAL HIGH (ref 0.450–4.500)

## 2015-04-23 LAB — CMP14+EGFR
A/G RATIO: 1.7 (ref 1.2–2.2)
ALBUMIN: 4.5 g/dL (ref 3.5–4.8)
ALT: 20 IU/L (ref 0–44)
AST: 23 IU/L (ref 0–40)
Alkaline Phosphatase: 62 IU/L (ref 39–117)
BILIRUBIN TOTAL: 0.4 mg/dL (ref 0.0–1.2)
BUN / CREAT RATIO: 11 (ref 10–24)
BUN: 13 mg/dL (ref 8–27)
CHLORIDE: 102 mmol/L (ref 96–106)
CO2: 25 mmol/L (ref 18–29)
Calcium: 9.8 mg/dL (ref 8.6–10.2)
Creatinine, Ser: 1.21 mg/dL (ref 0.76–1.27)
GFR calc non Af Amer: 57 mL/min/{1.73_m2} — ABNORMAL LOW (ref >59)
GFR, EST AFRICAN AMERICAN: 66 mL/min/{1.73_m2} (ref >59)
Globulin, Total: 2.7 g/dL (ref 1.5–4.5)
Glucose: 108 mg/dL — ABNORMAL HIGH (ref 65–99)
POTASSIUM: 4.2 mmol/L (ref 3.5–5.2)
SODIUM: 143 mmol/L (ref 134–144)
TOTAL PROTEIN: 7.2 g/dL (ref 6.0–8.5)

## 2015-08-28 DIAGNOSIS — M1712 Unilateral primary osteoarthritis, left knee: Secondary | ICD-10-CM | POA: Diagnosis not present

## 2015-09-11 ENCOUNTER — Encounter: Payer: Self-pay | Admitting: Family

## 2015-09-11 ENCOUNTER — Ambulatory Visit: Payer: Medicare Other

## 2015-09-11 ENCOUNTER — Ambulatory Visit (INDEPENDENT_AMBULATORY_CARE_PROVIDER_SITE_OTHER): Payer: Medicare Other | Admitting: Family

## 2015-09-11 VITALS — BP 146/84 | HR 80 | Temp 97.4°F | Wt 192.0 lb

## 2015-09-11 DIAGNOSIS — B084 Enteroviral vesicular stomatitis with exanthem: Secondary | ICD-10-CM | POA: Diagnosis not present

## 2015-09-11 NOTE — Progress Notes (Signed)
   Subjective:    Patient ID: James Thomas, male    DOB: 22-Jun-1934, 81 y.o.   MRN: [id number]  Pt presents to the office today Rash  This is a new problem. The current episode started 1 to 4 weeks ago. The problem is unchanged. The rash is diffuse. The rash is characterized by blistering, itchiness and redness. He was exposed to nothing. Pertinent negatives include no congestion, cough, eye pain, fatigue, joint pain, nail changes, rhinorrhea or sore throat. Past treatments include anti-itch cream. The treatment provided mild relief.      Review of Systems  Constitutional: Negative for fatigue.  HENT: Negative for congestion, rhinorrhea and sore throat.   Eyes: Negative for pain.  Respiratory: Negative for cough.   Musculoskeletal: Negative for joint pain.  Skin: Positive for rash. Negative for nail changes.  All other systems reviewed and are negative.      Objective:   Physical Exam  Constitutional: He is oriented to person, place, and time. He appears well-developed and well-nourished. No distress.  HENT:  Head: Normocephalic.  Eyes: Pupils are equal, round, and reactive to light. Right eye exhibits no discharge. Left eye exhibits no discharge.  Neck: Normal range of motion. Neck supple. No thyromegaly present.  Cardiovascular: Normal rate, regular rhythm, normal heart sounds and intact distal pulses.   No murmur heard. Pulmonary/Chest: Effort normal and breath sounds normal. No respiratory distress. He has no wheezes.  Abdominal: Soft. Bowel sounds are normal. He exhibits no distension. There is no tenderness.  Musculoskeletal: Normal range of motion. He exhibits no edema or tenderness.  Neurological: He is alert and oriented to person, place, and time. He has normal reflexes. No cranial nerve deficit.  Skin: Skin is warm and dry. Rash noted. No erythema.  Small erythemas blister rash on bilateral hands and feet   Psychiatric: He has a normal mood and affect. His behavior  is normal. Judgment and thought content normal.  Vitals reviewed.    BP (!) 146/84   Pulse 80   Temp 97.4 F (36.3 C) (Oral)   Wt 192 lb (87.1 kg)   BMI 26.04 kg/m       Assessment & Plan:  1. Hand, foot and mouth disease -Wash hands -Tylenol prn for pain or fever -Do not scratch -RTO prn   Evelina Dun, FNP

## 2015-10-14 DIAGNOSIS — D485 Neoplasm of uncertain behavior of skin: Secondary | ICD-10-CM | POA: Diagnosis not present

## 2015-10-14 DIAGNOSIS — Z85828 Personal history of other malignant neoplasm of skin: Secondary | ICD-10-CM | POA: Diagnosis not present

## 2015-10-14 DIAGNOSIS — L820 Inflamed seborrheic keratosis: Secondary | ICD-10-CM | POA: Diagnosis not present

## 2015-10-14 DIAGNOSIS — D0439 Carcinoma in situ of skin of other parts of face: Secondary | ICD-10-CM | POA: Diagnosis not present

## 2015-10-20 ENCOUNTER — Other Ambulatory Visit: Payer: Self-pay | Admitting: Family Medicine

## 2015-10-22 ENCOUNTER — Encounter: Payer: Self-pay | Admitting: Family Medicine

## 2015-10-22 ENCOUNTER — Ambulatory Visit (INDEPENDENT_AMBULATORY_CARE_PROVIDER_SITE_OTHER): Payer: Medicare Other | Admitting: Family Medicine

## 2015-10-22 VITALS — BP 151/90 | HR 72 | Temp 96.9°F | Ht 72.0 in | Wt 193.0 lb

## 2015-10-22 DIAGNOSIS — E785 Hyperlipidemia, unspecified: Secondary | ICD-10-CM | POA: Diagnosis not present

## 2015-10-22 DIAGNOSIS — Z23 Encounter for immunization: Secondary | ICD-10-CM | POA: Diagnosis not present

## 2015-10-22 DIAGNOSIS — I10 Essential (primary) hypertension: Secondary | ICD-10-CM | POA: Diagnosis not present

## 2015-10-22 LAB — LIPID PANEL
CHOL/HDL RATIO: 6.6 ratio — AB (ref 0.0–5.0)
Cholesterol, Total: 236 mg/dL — ABNORMAL HIGH (ref 100–199)
HDL: 36 mg/dL — AB (ref >39)
LDL Calculated: 168 mg/dL — ABNORMAL HIGH (ref 0–99)
TRIGLYCERIDES: 158 mg/dL — AB (ref 0–149)
VLDL Cholesterol Cal: 32 mg/dL (ref 5–40)

## 2015-10-22 NOTE — Progress Notes (Signed)
   Subjective:    Patient ID: James Thomas, male    DOB: 1934/03/30, 81 y.o.   MRN: [id number]  HPI 6 month visit to follow-up blood pressure and lipids. He has no complaints today. He did not take his medicine yet today and blood pressure is a little bit elevated at 154/87. He is on losartan and there is a question about it even lasting 24 hours. I have suggested she he start taking it at nighttime to get a better morning time effect.  Patient Active Problem List   Diagnosis Date Noted  . HTN (hypertension) 08/17/2012  . HLD (hyperlipidemia) 08/17/2012  . Hypothyroid 08/17/2012  . Seasonal allergic rhinitis 08/17/2012  . 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 Osteoporosis, unspecified 08/17/2012   Outpatient Encounter Prescriptions as of 10/22/2015  Medication Sig  . amLODipine (NORVASC) 10 MG tablet TAKE ONE TABLET BY MOUTH ONCE DAILY  . calcium citrate-vitamin D (CITRACAL+D) 315-200 MG-UNIT per tablet Take 1 tablet by mouth 2 (two) times daily.  . cholecalciferol (VITAMIN D) 1000 UNITS tablet Take 1,000 Units by mouth daily.  Marland Kitchen levothyroxine (SYNTHROID, LEVOTHROID) 50 MCG tablet Take 1 tablet (50 mcg total) by mouth daily.  Marland Kitchen losartan (COZAAR) 50 MG tablet Take 1 tablet (50 mg total) by mouth daily.  . Omega-3 Fatty Acids (FISH OIL) 1200 MG CAPS Take 2 capsules by mouth daily.  . [DISCONTINUED] fenofibrate (TRICOR) 145 MG tablet Take 1 tablet (145 mg total) by mouth daily.  . [DISCONTINUED] fexofenadine (ALLEGRA) 180 MG tablet Take 180 mg by mouth daily.  . [DISCONTINUED] glucosamine-chondroitin 500-400 MG tablet Take 2 tablets by mouth daily.   No facility-administered encounter medications on file as of 10/22/2015.       Review of Systems  Constitutional: Negative.   Respiratory: Negative.   Cardiovascular: Negative.   Neurological: Negative.        Objective:   Physical Exam  Constitutional: He is oriented to person, place, and time. He  appears well-developed and well-nourished.  Cardiovascular: Normal rate, regular rhythm and intact distal pulses.   Murmur heard. Pulmonary/Chest: Effort normal and breath sounds normal.  Neurological: He is alert and oriented to person, place, and time.  Psychiatric: He has a normal mood and affect. His behavior is normal.   BP (!) 151/90   Pulse 72   Temp (!) 96.9 F (36.1 C) (Oral)   Ht 6' (1.829 m)   Wt 193 lb (87.5 kg)   BMI 26.18 kg/m         Assessment & Plan:  1. Hyperlipidemia, unspecified hyperlipidemia type 10 year to follow lipids since he stopped statin. May need to consider Zetia or binding agent if LDL has gone up - Lipid panel   2. Essential hypertension Continue with losartan but try taking it at nighttime  Wardell Honour MD

## 2015-10-23 DIAGNOSIS — C44329 Squamous cell carcinoma of skin of other parts of face: Secondary | ICD-10-CM | POA: Diagnosis not present

## 2015-11-06 DIAGNOSIS — C44329 Squamous cell carcinoma of skin of other parts of face: Secondary | ICD-10-CM | POA: Diagnosis not present

## 2015-11-06 DIAGNOSIS — D485 Neoplasm of uncertain behavior of skin: Secondary | ICD-10-CM | POA: Diagnosis not present

## 2015-11-06 DIAGNOSIS — L820 Inflamed seborrheic keratosis: Secondary | ICD-10-CM | POA: Diagnosis not present

## 2015-12-03 ENCOUNTER — Other Ambulatory Visit: Payer: Self-pay | Admitting: *Deleted

## 2015-12-03 MED ORDER — LEVOTHYROXINE SODIUM 50 MCG PO TABS: 50.0000 ug | ORAL_TABLET | Freq: Every day | ORAL | 3 refills | Status: DC

## 2016-01-16 ENCOUNTER — Other Ambulatory Visit: Payer: Self-pay | Admitting: *Deleted

## 2016-01-16 MED ORDER — AMLODIPINE BESYLATE 10 MG PO TABS: 10.0000 mg | ORAL_TABLET | Freq: Every day | ORAL | 3 refills | Status: DC

## 2016-02-18 ENCOUNTER — Encounter: Payer: Self-pay | Admitting: *Deleted

## 2016-04-16 ENCOUNTER — Other Ambulatory Visit: Payer: Self-pay | Admitting: Family Medicine

## 2016-04-20 ENCOUNTER — Encounter: Payer: Self-pay | Admitting: Family Medicine

## 2016-04-20 ENCOUNTER — Ambulatory Visit (INDEPENDENT_AMBULATORY_CARE_PROVIDER_SITE_OTHER): Payer: Medicare Other | Admitting: Family Medicine

## 2016-04-20 ENCOUNTER — Ambulatory Visit: Payer: Medicare Other | Admitting: Family

## 2016-04-20 VITALS — BP 159/92 | HR 79 | Temp 97.0°F | Ht 73.0 in | Wt 198.2 lb

## 2016-04-20 DIAGNOSIS — R739 Hyperglycemia, unspecified: Secondary | ICD-10-CM | POA: Diagnosis not present

## 2016-04-20 DIAGNOSIS — I10 Essential (primary) hypertension: Secondary | ICD-10-CM | POA: Diagnosis not present

## 2016-04-20 DIAGNOSIS — R200 Anesthesia of skin: Secondary | ICD-10-CM | POA: Diagnosis not present

## 2016-04-20 DIAGNOSIS — E039 Hypothyroidism, unspecified: Secondary | ICD-10-CM | POA: Diagnosis not present

## 2016-04-20 DIAGNOSIS — E785 Hyperlipidemia, unspecified: Secondary | ICD-10-CM | POA: Diagnosis not present

## 2016-04-20 MED ORDER — AMLODIPINE BESYLATE 5 MG PO TABS: 5.0000 mg | ORAL_TABLET | Freq: Every day | ORAL | 3 refills | Status: DC

## 2016-04-20 NOTE — Progress Notes (Signed)
   HPI  Patient presents today here for follow-up of chronic medical conditions.  Hyperlipidemia Patient had itching and pain with statins, he does not want to take these again.  Hypertension, L arm numbness Patient states that he had left arm numbness that improved after changing amlodipine 10 every other day. He states the numbness only happens at night, he was waking up 2 or 3 times a night with improvement of pain with walking. Pt has a log- ranges 139-160/73-86 Has been taking amlodipine every other day Losartan at night  Slight lateral thumb numbness at baseline, no hand or leg weakness. Works as a Civil engineer, contracting   PMH: Smoking status noted ROS: Per HPI  Objective: BP (!) 159/92   Pulse 79   Temp 97 F (36.1 C) (Oral)   Ht _0  (1.854 m)   Wt 198 lb 3.2 oz (89.9 kg)   BMI 26.15 kg/m  Gen: NAD, alert, cooperative with exam HEENT: NCAT, EOMI, PERRL CV: RRR, good S1/S2, no murmur Resp: CTABL, no wheezes, non-labored Ext: No edema, warm, 2+ pulse on L arm.  Neuro: Alert and oriented, No gross deficits  Assessment and plan:  # HTN Elevated today, pt taking amlodipine every other day Continue losartan at current dose Decrease amlopipine to 5 mg and start taking daily  # L arm numbness Unlikely to be related to BP meds, Discussed this Likely radiculopathy, considered PAD as well, however has very good pulse on exam.  Follow clinically for now  # HLD Elevated LDL last visit May need zetia, pt wants to avoid statins  # Hypothyroidism Stable clinically No synthroid change, labs.     Orders Placed This Encounter  Procedures  . CMP14+EGFR  . Lipid panel  . CBC with Differential/Platelet  . TSH    Meds ordered this encounter  Medications  . amLODipine (NORVASC) 5 MG tablet    Sig: Take 1 tablet (5 mg total) by mouth daily.    Dispense:  90 tablet    Refill:  [place], MD [place] 04/20/2016, 8:52  AM

## 2016-04-20 NOTE — Patient Instructions (Signed)
Great to see you!  I have changed amlodipine to 5 mg once daily, this is a change from taking 10 mg every other day.   Come back in 6 months, please be sure to watch your blood pressures. My goal is 140/90.

## 2016-04-21 ENCOUNTER — Ambulatory Visit: Payer: Medicare Other | Admitting: Family Medicine

## 2016-04-21 LAB — CMP14+EGFR
ALK PHOS: 101 IU/L (ref 39–117)
ALT: 18 IU/L (ref 0–44)
AST: 21 IU/L (ref 0–40)
Albumin/Globulin Ratio: 1.6 (ref 1.2–2.2)
Albumin: 4.3 g/dL (ref 3.5–4.8)
BILIRUBIN TOTAL: 1.1 mg/dL (ref 0.0–1.2)
BUN/Creatinine Ratio: 8 — ABNORMAL LOW (ref 10–24)
BUN: 8 mg/dL (ref 8–27)
CO2: 25 mmol/L (ref 18–29)
Calcium: 9.6 mg/dL (ref 8.6–10.2)
Chloride: 100 mmol/L (ref 96–106)
Creatinine, Ser: 1 mg/dL (ref 0.76–1.27)
GFR calc Af Amer: 82 mL/min/{1.73_m2} (ref >59)
GFR calc non Af Amer: 71 mL/min/{1.73_m2} (ref >59)
GLUCOSE: 117 mg/dL — AB (ref 65–99)
Globulin, Total: 2.7 g/dL (ref 1.5–4.5)
POTASSIUM: 4.1 mmol/L (ref 3.5–5.2)
SODIUM: 142 mmol/L (ref 134–144)
Total Protein: 7 g/dL (ref 6.0–8.5)

## 2016-04-21 LAB — CBC WITH DIFFERENTIAL/PLATELET
Basophils Absolute: 0.1 10*3/uL (ref 0.0–0.2)
Basos: 1 %
EOS (ABSOLUTE): 0.3 10*3/uL (ref 0.0–0.4)
Eos: 4 %
Hematocrit: 44.7 % (ref 37.5–51.0)
Hemoglobin: 15.8 g/dL (ref 13.0–17.7)
IMMATURE GRANS (ABS): 0 10*3/uL (ref 0.0–0.1)
Immature Granulocytes: 0 %
LYMPHS ABS: 1.5 10*3/uL (ref 0.7–3.1)
LYMPHS: 19 %
MCH: 31.1 pg (ref 26.6–33.0)
MCHC: 35.3 g/dL (ref 31.5–35.7)
MCV: 88 fL (ref 79–97)
MONOCYTES: 8 %
Monocytes Absolute: 0.6 10*3/uL (ref 0.1–0.9)
NEUTROS ABS: 5.2 10*3/uL (ref 1.4–7.0)
Neutrophils: 68 %
PLATELETS: 317 10*3/uL (ref 150–379)
RBC: 5.08 x10E6/uL (ref 4.14–5.80)
RDW: 13.3 % (ref 12.3–15.4)
WBC: 7.6 10*3/uL (ref 3.4–10.8)

## 2016-04-21 LAB — LIPID PANEL
CHOL/HDL RATIO: 7.2 ratio — AB (ref 0.0–5.0)
Cholesterol, Total: 229 mg/dL — ABNORMAL HIGH (ref 100–199)
HDL: 32 mg/dL — ABNORMAL LOW (ref >39)
LDL CALC: 155 mg/dL — AB (ref 0–99)
TRIGLYCERIDES: 211 mg/dL — AB (ref 0–149)
VLDL Cholesterol Cal: 42 mg/dL — ABNORMAL HIGH (ref 5–40)

## 2016-04-21 LAB — TSH: TSH: 5.15 u[IU]/mL — AB (ref 0.450–4.500)

## 2016-04-23 LAB — HGB A1C W/O EAG: Hgb A1c MFr Bld: 6.2 % — ABNORMAL HIGH (ref 4.8–5.6)

## 2016-04-23 LAB — SPECIMEN STATUS REPORT

## 2016-07-26 ENCOUNTER — Other Ambulatory Visit: Payer: Self-pay | Admitting: Family Medicine

## 2016-08-04 ENCOUNTER — Encounter: Payer: Self-pay | Admitting: Family Medicine

## 2016-08-04 ENCOUNTER — Ambulatory Visit (INDEPENDENT_AMBULATORY_CARE_PROVIDER_SITE_OTHER): Payer: Medicare Other | Admitting: Family Medicine

## 2016-08-04 ENCOUNTER — Ambulatory Visit (INDEPENDENT_AMBULATORY_CARE_PROVIDER_SITE_OTHER): Payer: Medicare Other

## 2016-08-04 VITALS — BP 146/81 | HR 69 | Temp 96.9°F | Ht 73.0 in | Wt 195.0 lb

## 2016-08-04 DIAGNOSIS — E039 Hypothyroidism, unspecified: Secondary | ICD-10-CM | POA: Diagnosis not present

## 2016-08-04 DIAGNOSIS — R200 Anesthesia of skin: Secondary | ICD-10-CM

## 2016-08-04 DIAGNOSIS — I10 Essential (primary) hypertension: Secondary | ICD-10-CM

## 2016-08-04 DIAGNOSIS — R202 Paresthesia of skin: Secondary | ICD-10-CM

## 2016-08-04 MED ORDER — PREDNISONE 10 MG PO TABS: ORAL_TABLET | ORAL | 0 refills | Status: DC

## 2016-08-04 NOTE — Progress Notes (Signed)
Subjective:  Patient ID: THEODOR MUSTIN,  male    DOB: 08/13/1934  Age: 82 y.o.    CC: Hand Problem (pt here today c/o left thumb numbness and at times the tips of all the fingers on his left hand are numb.)   HPI FENIX RORKE presents for  follow-up of hypertension. Patient has no history of headache chest pain or shortness of breath or recent cough. Patient also denies symptoms of TIA such as numbness weakness lateralizing.   Patient denies side effects from medication. States taking it regularly.  Patient presents for follow-up on  thyroid. The patient has a history of hypothyroidism for many years. It has been stable recently. Pt. denies any change in  voice, loss of hair, heat or cold intolerance. Energy level has been adequate to good. Patient denies constipation and diarrhea. No myxedema. Medication is as noted below. Verified that pt is taking it daily on an empty stomach. Well tolerated.   Thumb has been numb for a couple of months. It is primarily at the ventral aspect distal to the MCP. The ventral aspect of the second digit has been numb as well. There has been no weakness. He denies any injury. The discomfort is moderate and there is occasional paresthesia.    Past history  Rahkeem has a past medical history of Arthritis; Hypercholesteremia; Hypertension; Hypothyroidism; and Osteoporosis.   He has a past surgical history that includes Back surgery (0109;[id number]) and Cataract extraction w/PHACO (02/01/2011).   His family history includes Diabetes in his father.He reports that he quit smoking about 44 years ago. His smoking use included Cigarettes. He has a 10.00 pack-year smoking history. He uses smokeless tobacco. He reports that he does not drink alcohol or use drugs.  Current Outpatient Prescriptions on File Prior to Visit  Medication Sig Dispense Refill  . amLODipine (NORVASC) 5 MG tablet Take 1 tablet (5 mg total) by mouth daily. 90 tablet 3  . calcium citrate-vitamin D  (CITRACAL+D) 315-200 MG-UNIT per tablet Take 1 tablet by mouth daily.     . cholecalciferol (VITAMIN D) 1000 UNITS tablet Take 2,000 Units by mouth daily.     Marland Kitchen levothyroxine (SYNTHROID, LEVOTHROID) 50 MCG tablet Take 1 tablet (50 mcg total) by mouth daily. 90 tablet 3  . losartan (COZAAR) 50 MG tablet TAKE 1 TABLET BY MOUTH ONCE DAILY 90 tablet 0  . Omega-3 Fatty Acids (FISH OIL) 1200 MG CAPS Take 2 capsules by mouth daily.     No current facility-administered medications on file prior to visit.     ROS Review of Systems  Constitutional: Negative for chills, diaphoresis, fever and unexpected weight change.  HENT: Negative for congestion, hearing loss, rhinorrhea and sore throat.   Eyes: Negative for visual disturbance.  Respiratory: Negative for cough and shortness of breath.   Cardiovascular: Negative for chest pain.  Gastrointestinal: Negative for abdominal pain, constipation and diarrhea.  Genitourinary: Negative for dysuria and flank pain.  Musculoskeletal: Positive for joint swelling. Negative for arthralgias.  Skin: Negative for rash.  Neurological: Negative for dizziness and headaches.  Psychiatric/Behavioral: Negative for dysphoric mood and sleep disturbance.    Objective:  BP (!) 146/81   Pulse 69   Temp (!) 96.9 F (36.1 C) (Oral)   Ht 6\' 1"  (1.854 m)   Wt 195 lb (88.5 kg)   BMI 25.73 kg/m   BP Readings from Last 3 Encounters:  08/04/16 (!) 146/81  04/20/16 (!) 159/92  10/22/15 (!) 151/90  Wt Readings from Last 3 Encounters:  08/04/16 195 lb (88.5 kg)  04/20/16 198 lb 3.2 oz (89.9 kg)  10/22/15 193 lb (87.5 kg)     Physical Exam  Constitutional: He is oriented to person, place, and time. He appears well-developed and well-nourished. No distress.  HENT:  Head: Normocephalic and atraumatic.  Right Ear: External ear normal.  Left Ear: External ear normal.  Nose: Nose normal.  Mouth/Throat: Oropharynx is clear and moist.  Eyes: Pupils are equal, round,  and reactive to light. Conjunctivae and EOM are normal.  Neck: Normal range of motion. Neck supple. No thyromegaly present.  Cardiovascular: Normal rate, regular rhythm and normal heart sounds.   No murmur heard. Pulmonary/Chest: Effort normal and breath sounds normal. No respiratory distress. He has no wheezes. He has no rales.  Abdominal: Soft. Bowel sounds are normal. He exhibits no distension. There is no tenderness.  Musculoskeletal: He exhibits tenderness (at left first MCP and left first PIP).  Lymphadenopathy:    He has no cervical adenopathy.  Neurological: He is alert and oriented to person, place, and time. He has normal reflexes.  Skin: Skin is warm and dry.  Psychiatric: He has a normal mood and affect. His behavior is normal. Judgment and thought content normal.      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 No fracture. Nik was seen today for hand problem.  Diagnoses and all orders for this visit:  Numbness and tingling of left thumb -     DG Hand Complete Left; Future  Essential hypertension  Hypothyroidism, unspecified type  Other orders -     predniSONE (DELTASONE) 10 MG tablet; Take 5 daily for 2 days followed by 4,3,2 and 1 for 2 days each.   I am having Mr. Celestine start on predniSONE. I am also having him maintain his cholecalciferol, calcium citrate-vitamin D, Fish Oil, levothyroxine, amLODipine, and losartan.  Meds ordered this encounter  Medications  . predniSONE (DELTASONE) 10 MG tablet    Sig: Take 5 daily for 2 days followed by 4,3,2 and 1 for 2 days each.    Dispense:  30 tablet    Refill:  0     Follow-up: Return in about 1 month (around 09/04/2016) for Hypothyroidism, hypertension, cholesterol.  Claretta Fraise, M.D.

## 2016-08-05 ENCOUNTER — Telehealth: Payer: Self-pay | Admitting: Family Medicine

## 2016-08-05 NOTE — Telephone Encounter (Signed)
Printed xray report out and put in drawer up front. Called and told wife it was ready to be picked up

## 2016-10-13 DIAGNOSIS — L570 Actinic keratosis: Secondary | ICD-10-CM | POA: Diagnosis not present

## 2016-10-13 DIAGNOSIS — Z85828 Personal history of other malignant neoplasm of skin: Secondary | ICD-10-CM | POA: Diagnosis not present

## 2016-10-21 ENCOUNTER — Ambulatory Visit: Payer: Medicare Other | Admitting: Family Medicine

## 2016-10-22 ENCOUNTER — Encounter: Payer: Self-pay | Admitting: Family Medicine

## 2016-10-22 ENCOUNTER — Ambulatory Visit (INDEPENDENT_AMBULATORY_CARE_PROVIDER_SITE_OTHER): Payer: Medicare Other | Admitting: Family Medicine

## 2016-10-22 VITALS — BP 143/80 | HR 78 | Temp 97.4°F | Ht 73.0 in | Wt 195.0 lb

## 2016-10-22 DIAGNOSIS — R7303 Prediabetes: Secondary | ICD-10-CM

## 2016-10-22 DIAGNOSIS — Z23 Encounter for immunization: Secondary | ICD-10-CM | POA: Diagnosis not present

## 2016-10-22 DIAGNOSIS — E039 Hypothyroidism, unspecified: Secondary | ICD-10-CM | POA: Diagnosis not present

## 2016-10-22 DIAGNOSIS — M810 Age-related osteoporosis without current pathological fracture: Secondary | ICD-10-CM | POA: Diagnosis not present

## 2016-10-22 DIAGNOSIS — I10 Essential (primary) hypertension: Secondary | ICD-10-CM | POA: Diagnosis not present

## 2016-10-22 DIAGNOSIS — N400 Enlarged prostate without lower urinary tract symptoms: Secondary | ICD-10-CM

## 2016-10-22 DIAGNOSIS — E782 Mixed hyperlipidemia: Secondary | ICD-10-CM | POA: Diagnosis not present

## 2016-10-22 LAB — BAYER DCA HB A1C WAIVED: HB A1C: 6.2 % (ref <7.0)

## 2016-10-22 MED ORDER — LOSARTAN POTASSIUM 50 MG PO TABS: 50.0000 mg | ORAL_TABLET | Freq: Every day | ORAL | 1 refills | Status: DC

## 2016-10-22 MED ORDER — LEVOTHYROXINE SODIUM 50 MCG PO TABS: 50.0000 ug | ORAL_TABLET | Freq: Every day | ORAL | 3 refills | Status: DC

## 2016-10-22 NOTE — Progress Notes (Signed)
Subjective:  Patient ID: James Thomas, male    DOB: 12/17/1934  Age: 82 y.o. MRN: [id number]  CC: Hypertension (pt here today for routine follow up of his chronic medical conditions, no other concerns voiced.)   HPI James Thomas presents for Patient presents for follow-up on  thyroid. The patient has a history of hypothyroidism for many years. It has been stable recently. Pt. denies any change in  voice, loss of hair, heat or cold intolerance. Energy level has been adequate to good. Patient denies constipation and diarrhea. No myxedema. Medication is as noted below. Verified that pt is taking it daily on an empty stomach. Well tolerated.  Patient in for recheck of vitamin D. He is taking 2000 units daily. Has not been checked since it was low. He does have a history of osteoporosis. Patient did not go through diabetic/prediabetic education after his A1c was found to be in the prediabetic range 6 months ago. He has not been following a specific diet area   follow-up of hypertension. Patient has no history of headache chest pain or shortness of breath or recent cough. Patient also denies symptoms of TIA such as numbness weakness lateralizing. Patient checks  blood pressure at home and has not had any elevated readings recently. Patient denies side effects from his medication. States taking it regularly.  Patient denies prostate symptoms but has not had a PSA or rectal exam in quite some time and is due for that today as well.  Depression screen [facility] 2/9 10/22/2016 08/04/2016 04/20/2016  Decreased Interest 0 0 0  Down, Depressed, Hopeless 0 0 0  PHQ - 2 Score 0 0 0    History James Thomas has a past medical history of Arthritis; Hypercholesteremia; Hypertension; Hypothyroidism; and Osteoporosis.   He has a past surgical history that includes Back surgery (2440;1027) and Cataract extraction w/PHACO (02/01/2011).   His family history includes Diabetes in his father.He reports that he quit smoking  about 44 years ago. His smoking use included Cigarettes. He has a 10.00 pack-year smoking history. He uses smokeless tobacco. He reports that he does not drink alcohol or use drugs.    ROS Review of Systems  Constitutional: Negative for chills, diaphoresis, fever and unexpected weight change.  HENT: Negative for congestion, hearing loss, rhinorrhea and sore throat.   Eyes: Negative for visual disturbance.  Respiratory: Negative for cough and shortness of breath.   Cardiovascular: Negative for chest pain.  Gastrointestinal: Negative for abdominal pain, constipation and diarrhea.  Genitourinary: Negative for dysuria and flank pain.  Musculoskeletal: Negative for arthralgias and joint swelling.  Skin: Negative for rash.  Neurological: Negative for dizziness and headaches.  Psychiatric/Behavioral: Negative for dysphoric mood and sleep disturbance.    Objective:  BP (!) 143/80   Pulse 78   Temp (!) 97.4 F (36.3 C) (Oral)   Ht '6\' 1"'$  (1.854 m)   Wt 195 lb (88.5 kg)   BMI 25.73 kg/m   BP Readings from Last 3 Encounters:  10/22/16 (!) 143/80  08/04/16 (!) 146/81  04/20/16 (!) 159/92    Wt Readings from Last 3 Encounters:  10/22/16 195 lb (88.5 kg)  08/04/16 195 lb (88.5 kg)  04/20/16 198 lb 3.2 oz (89.9 kg)     Physical Exam  Constitutional: He is oriented to person, place, and time. He appears well-developed and well-nourished. No distress.  HENT:  Head: Normocephalic and atraumatic.  Right Ear: External ear normal.  Left Ear: External ear normal.  Nose: Nose  normal.  Mouth/Throat: Oropharynx is clear and moist.  Eyes: Pupils are equal, round, and reactive to light. Conjunctivae and EOM are normal.  Neck: Normal range of motion. Neck supple. No thyromegaly present.  Cardiovascular: Normal rate, regular rhythm and normal heart sounds.   No murmur heard. Pulmonary/Chest: Effort normal and breath sounds normal. No respiratory distress. He has no wheezes. He has no rales.    Abdominal: Soft. Bowel sounds are normal. He exhibits no distension. There is no tenderness.  Lymphadenopathy:    He has no cervical adenopathy.  Neurological: He is alert and oriented to person, place, and time. He has normal reflexes.  Skin: Skin is warm and dry.  Psychiatric: He has a normal mood and affect. His behavior is normal. Judgment and thought content normal.      Assessment & Plan:   James Thomas was seen today for hypertension.  Diagnoses and all orders for this visit:  Hypothyroidism, unspecified type -     TSH -     T4, free  Age-related osteoporosis without current pathological fracture  Essential hypertension -     CBC with Differential/Platelet  Mixed hyperlipidemia -     CMP14+EGFR -     Lipid panel  Prediabetes -     Bayer DCA Hb A1c Waived  Benign prostatic hyperplasia, unspecified whether lower urinary tract symptoms present -     PSA, total and free  Other orders -     levothyroxine (SYNTHROID, LEVOTHROID) 50 MCG tablet; Take 1 tablet (50 mcg total) by mouth daily. -     losartan (COZAAR) 50 MG tablet; Take 1 tablet (50 mg total) by mouth daily.       I have discontinued Mr. Raper calcium citrate-vitamin D and predniSONE. I have also changed his losartan. Additionally, I am having him maintain his cholecalciferol, Fish Oil, amLODipine, and levothyroxine.  Allergies as of 10/22/2016      Reactions   Fenofibrate Cough   Meloxicam    Unsure but thinks it gave him a rash   Niacin And Related Itching, Rash      Medication List       Accurate as of 10/22/16 12:00 PM. Always use your most recent med list.          amLODipine 5 MG tablet Commonly known as:  NORVASC Take 1 tablet (5 mg total) by mouth daily.   cholecalciferol 1000 units tablet Commonly known as:  VITAMIN D Take 2,000 Units by mouth daily.   Fish Oil 1200 MG Caps Take 2 capsules by mouth daily.   levothyroxine 50 MCG tablet Commonly known as:  SYNTHROID,  LEVOTHROID Take 1 tablet (50 mcg total) by mouth daily.   losartan 50 MG tablet Commonly known as:  COZAAR Take 1 tablet (50 mg total) by mouth daily.       After some discussion, the patient prefers to adopt the more loose 150/90 recommendation for seniors for control of blood pressure. He is concerned about tightness of control versus side effects.  Follow-up: Return in about 6 months (around 04/22/2017).  Claretta Fraise, M.D.

## 2016-10-23 LAB — CMP14+EGFR
ALBUMIN: 4.3 g/dL (ref 3.5–4.7)
ALT: 15 IU/L (ref 0–44)
AST: 20 IU/L (ref 0–40)
Albumin/Globulin Ratio: 1.7 (ref 1.2–2.2)
Alkaline Phosphatase: 112 IU/L (ref 39–117)
BUN/Creatinine Ratio: 7 — ABNORMAL LOW (ref 10–24)
BUN: 7 mg/dL — AB (ref 8–27)
Bilirubin Total: 1 mg/dL (ref 0.0–1.2)
CALCIUM: 9.7 mg/dL (ref 8.6–10.2)
CHLORIDE: 100 mmol/L (ref 96–106)
CO2: 26 mmol/L (ref 20–29)
CREATININE: 1.01 mg/dL (ref 0.76–1.27)
GFR, EST AFRICAN AMERICAN: 81 mL/min/{1.73_m2} (ref >59)
GFR, EST NON AFRICAN AMERICAN: 70 mL/min/{1.73_m2} (ref >59)
GLUCOSE: 125 mg/dL — AB (ref 65–99)
Globulin, Total: 2.6 g/dL (ref 1.5–4.5)
Potassium: 4 mmol/L (ref 3.5–5.2)
Sodium: 141 mmol/L (ref 134–144)
TOTAL PROTEIN: 6.9 g/dL (ref 6.0–8.5)

## 2016-10-23 LAB — CBC WITH DIFFERENTIAL/PLATELET
BASOS ABS: 0.1 10*3/uL (ref 0.0–0.2)
BASOS: 1 %
EOS (ABSOLUTE): 0.3 10*3/uL (ref 0.0–0.4)
Eos: 5 %
HEMOGLOBIN: 15.2 g/dL (ref 13.0–17.7)
Hematocrit: 44.6 % (ref 37.5–51.0)
IMMATURE GRANS (ABS): 0 10*3/uL (ref 0.0–0.1)
Immature Granulocytes: 0 %
LYMPHS: 20 %
Lymphocytes Absolute: 1.3 10*3/uL (ref 0.7–3.1)
MCH: 30.5 pg (ref 26.6–33.0)
MCHC: 34.1 g/dL (ref 31.5–35.7)
MCV: 89 fL (ref 79–97)
MONOCYTES: 9 %
Monocytes Absolute: 0.6 10*3/uL (ref 0.1–0.9)
NEUTROS ABS: 4.2 10*3/uL (ref 1.4–7.0)
NEUTROS PCT: 65 %
PLATELETS: 287 10*3/uL (ref 150–379)
RBC: 4.99 x10E6/uL (ref 4.14–5.80)
RDW: 14.1 % (ref 12.3–15.4)
WBC: 6.4 10*3/uL (ref 3.4–10.8)

## 2016-10-23 LAB — LIPID PANEL
CHOL/HDL RATIO: 7.6 ratio — AB (ref 0.0–5.0)
Cholesterol, Total: 214 mg/dL — ABNORMAL HIGH (ref 100–199)
HDL: 28 mg/dL — AB (ref >39)
LDL CALC: 138 mg/dL — AB (ref 0–99)
Triglycerides: 239 mg/dL — ABNORMAL HIGH (ref 0–149)
VLDL CHOLESTEROL CAL: 48 mg/dL — AB (ref 5–40)

## 2016-10-23 LAB — PSA, TOTAL AND FREE
PSA, Free Pct: 31.3 %
PSA, Free: 0.25 ng/mL
Prostate Specific Ag, Serum: 0.8 ng/mL (ref 0.0–4.0)

## 2016-10-23 LAB — T4, FREE: Free T4: 1.31 ng/dL (ref 0.82–1.77)

## 2016-10-23 LAB — TSH: TSH: 3.59 u[IU]/mL (ref 0.450–4.500)

## 2016-11-02 ENCOUNTER — Encounter: Payer: Self-pay | Admitting: *Deleted

## 2016-12-20 ENCOUNTER — Ambulatory Visit: Payer: Medicare Other | Admitting: *Deleted

## 2017-01-31 ENCOUNTER — Encounter: Payer: Self-pay | Admitting: *Deleted

## 2017-01-31 ENCOUNTER — Ambulatory Visit (INDEPENDENT_AMBULATORY_CARE_PROVIDER_SITE_OTHER): Payer: Medicare Other | Admitting: *Deleted

## 2017-01-31 ENCOUNTER — Telehealth: Payer: Self-pay | Admitting: *Deleted

## 2017-01-31 VITALS — BP 134/67 | HR 71 | Ht 70.5 in | Wt 200.0 lb

## 2017-01-31 DIAGNOSIS — Z0000 Encounter for general adult medical examination without abnormal findings: Secondary | ICD-10-CM

## 2017-01-31 DIAGNOSIS — N390 Urinary tract infection, site not specified: Secondary | ICD-10-CM | POA: Diagnosis not present

## 2017-01-31 DIAGNOSIS — Z23 Encounter for immunization: Secondary | ICD-10-CM

## 2017-01-31 DIAGNOSIS — R413 Other amnesia: Secondary | ICD-10-CM | POA: Diagnosis not present

## 2017-01-31 LAB — URINALYSIS, ROUTINE W REFLEX MICROSCOPIC
Bilirubin, UA: NEGATIVE
Glucose, UA: NEGATIVE
Nitrite, UA: NEGATIVE
Protein, UA: NEGATIVE
RBC, UA: NEGATIVE
SPEC GRAV UA: 1.02 (ref 1.005–1.030)
Urobilinogen, Ur: 1 mg/dL (ref 0.2–1.0)
pH, UA: 6 (ref 5.0–7.5)

## 2017-01-31 LAB — MICROSCOPIC EXAMINATION
RBC, UA: NONE SEEN /hpf (ref 0–?)
Renal Epithel, UA: NONE SEEN /hpf

## 2017-01-31 MED ORDER — SULFAMETHOXAZOLE-TRIMETHOPRIM 400-80 MG PO TABS: 1.0000 | ORAL_TABLET | Freq: Two times a day (BID) | ORAL | 0 refills | Status: DC

## 2017-01-31 NOTE — Patient Instructions (Addendum)
James Thomas , Thank you for taking time to come for your Medicare Wellness Visit. I appreciate your ongoing commitment to your health goals. Please review the following plan we discussed and let me know if I can assist you in the future.   These are the goals we discussed: Exercise daily. Work your way up to 20 or 30 minutes a day. See chair exercise handout Move carefully to avoid falls  This is a list of the screening recommended for you and due dates:  Adult vaccines due  Topic Date Due  . TETANUS/TDAP  08/17/2020   Pneumococcal Conjugate Vaccine suspension for injection What is this medicine? PNEUMOCOCCAL VACCINE (NEU mo KOK al vak SEEN) is a vaccine used to prevent pneumococcus bacterial infections. These bacteria can cause serious infections like pneumonia, meningitis, and blood infections. This vaccine will lower your chance of getting pneumonia. If you do get pneumonia, it can make your symptoms milder and your illness shorter. This vaccine will not treat an infection and will not cause infection. This vaccine is recommended for infants and young children, adults with certain medical conditions, and adults 24 years or older. This medicine may be used for other purposes; ask your health care provider or pharmacist if you have questions. COMMON BRAND NAME(S): Prevnar, Prevnar 13 What should I tell my health care provider before I take this medicine? They need to know if you have any of these conditions: -bleeding problems -fever -immune system problems -an unusual or allergic reaction to pneumococcal vaccine, diphtheria toxoid, other vaccines, latex, other medicines, foods, dyes, or preservatives -pregnant or trying to get pregnant -breast-feeding How should I use this medicine? This vaccine is for injection into a muscle. It is given by a health care professional. A copy of Vaccine Information Statements will be given before each vaccination. Read this sheet carefully each time.  The sheet may change frequently. Talk to your pediatrician regarding the use of this medicine in children. While this drug may be prescribed for children as young as 51 weeks old for selected conditions, precautions do apply. Overdosage: If you think you have taken too much of this medicine contact a poison control center or emergency room at once. NOTE: This medicine is only for you. Do not share this medicine with others. What if I miss a dose? It is important not to miss your dose. Call your doctor or health care professional if you are unable to keep an appointment. What may interact with this medicine? -medicines for cancer chemotherapy -medicines that suppress your immune function -steroid medicines like prednisone or cortisone This list may not describe all possible interactions. Give your health care provider a list of all the medicines, herbs, non-prescription drugs, or dietary supplements you use. Also tell them if you smoke, drink alcohol, or use illegal drugs. Some items may interact with your medicine. What should I watch for while using this medicine? Mild fever and pain should go away in 3 days or less. Report any unusual symptoms to your doctor or health care professional. What side effects may I notice from receiving this medicine? Side effects that you should report to your doctor or health care professional as soon as possible: -allergic reactions like skin rash, itching or hives, swelling of the face, lips, or tongue -breathing problems -confused -fast or irregular heartbeat -fever over 102 degrees F -seizures -unusual bleeding or bruising -unusual muscle weakness Side effects that usually do not require medical attention (report to your doctor or health care professional  if they continue or are bothersome): -aches and pains -diarrhea -fever of 102 degrees F or less -headache -irritable -loss of appetite -pain, tender at site where injected -trouble sleeping This  list may not describe all possible side effects. Call your doctor for medical advice about side effects. You may report side effects to FDA at 1-800-FDA-1088. Where should I keep my medicine? This does not apply. This vaccine is given in a clinic, pharmacy, doctor's office, or other health care setting and will not be stored at home. NOTE: This sheet is a summary. It may not cover all possible information. If you have questions about this medicine, talk to your doctor, pharmacist, or health care provider.  2018 Elsevier/Gold Standard (2013-09-27 10:27:27)

## 2017-02-01 LAB — URINE CULTURE: ORGANISM ID, BACTERIA: NO GROWTH

## 2017-02-01 NOTE — Telephone Encounter (Signed)
Spoke with patient's daughter about her concerns. Patient is scheduled for a visit with Dr Dettinger on 02/07/17.

## 2017-02-01 NOTE — Progress Notes (Signed)
Subjective:   James Thomas is a 83 y.o. male who presents for an Initial Medicare Annual Wellness Visit. James Thomas is accompanied by his wife. 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 He is married and lives in a one story home with a basement. They have one daughter and 2 granddaughters.   Review of Systems  Health is about the same as last year.   Cardiac Risk Factors include: advanced age (>85men, >60 women);family history of premature cardiovascular disease;hypertension;male gender;sedentary lifestyle   Neurologic: His wife has noticed some confusion, mostly at night.     Objective:    Today's Vitals   01/31/17 0826  BP: 134/67  Pulse: 71  Weight: 200 lb (90.7 kg)  Height: 5' 10.5" (1.791 m)   Body mass index is 28.29 kg/m.  Advanced Directives 01/31/2017 01/27/2011  Does Patient Have a Medical Advance Directive? Yes Patient has advance directive, copy not in chart  Type of Advance Directive [place];Living will Living will  Does patient want to make changes to medical advance directive? No - Patient declined -  Copy of [place] in Chart? No - copy requested Copy requested from family  Pre-existing out of facility DNR order (yellow form or pink MOST form) - No    Current Medications (verified) Outpatient Encounter Medications as of 01/31/2017  Medication Sig  . amLODipine (NORVASC) 5 MG tablet Take 1 tablet (5 mg total) by mouth daily.  . calcium carbonate 1250 MG capsule Take 1,250 mg by mouth 2 (two) times daily with a meal.  . cholecalciferol (VITAMIN D) 1000 UNITS tablet Take 2,000 Units by mouth daily.   Marland Kitchen levothyroxine (SYNTHROID, LEVOTHROID) 50 MCG tablet Take 1 tablet (50 mcg total) by mouth daily.  Marland Kitchen losartan (COZAAR) 50 MG tablet Take 1 tablet (50 mg total) by mouth daily.  . Omega-3 Fatty Acids (FISH OIL) 1200 MG CAPS Take 2 capsules by mouth daily.  Marland Kitchen sulfamethoxazole-trimethoprim  (BACTRIM,SEPTRA) 400-80 MG tablet Take 1 tablet by mouth 2 (two) times daily.   No facility-administered encounter medications on file as of 01/31/2017.     Allergies (verified) Fenofibrate; Meloxicam; and Niacin and related   History: Past Medical History:  Diagnosis Date  . Arthritis   . Hypercholesteremia   . Hypertension   . Hypothyroidism   . Osteoporosis    Past Surgical History:  Procedure Laterality Date  . BACK SURGERY  1979;1985   slipped disc;ruptured disc  . CATARACT EXTRACTION W/PHACO  02/01/2011   Procedure: CATARACT EXTRACTION PHACO AND INTRAOCULAR LENS PLACEMENT (IOC);  Surgeon: Tonny Branch, MD;  Location: [facility];  Service: Ophthalmology;  Laterality: Right;  CDE:18.53   Family History  Problem Relation Age of Onset  . Diabetes Father   . Heart attack Maternal Grandmother   . Heart attack Brother   . Anesthesia problems Neg Hx   . Hypotension Neg Hx   . Malignant hyperthermia Neg Hx   . Pseudochol deficiency Neg Hx    Social History   Socioeconomic History  . Marital status: Married    Spouse name: Not on file  . Number of children: 1  . Years of education: 14 years  . Highest education level: Associate degree: occupational, Hotel manager, or vocational program  Social Needs  . Financial resource strain: Not hard at all  . Food insecurity - worry: Never true  . Food insecurity - inability: Never true  . Transportation needs -  medical: No  . Transportation needs - non-medical: No  Occupational History  . Occupation: Retired    Comment: Engineer, building services, bodywork  Tobacco Use  . Smoking status: Former Smoker    Packs/day: 1.00    Years: 10.00    Pack years: 10.00    Types: Cigarettes    Last attempt to quit: 01/27/1972    Years since quitting: 45.0  . Smokeless tobacco: Current User  Substance and Sexual Activity  . Alcohol use: No  . Drug use: No  . Sexual activity: No  Other Topics Concern  . 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 He is married and lives in a one story home with a basement with his wife. They have one adult daughter and two granddaughters.    Tobacco Counseling No current tobacco use  Clinical Intake:     Pain : No/denies pain     Nutritional Status: BMI of 19-24  Normal Diabetes: No  How often do you need to have someone help you when you read instructions, pamphlets, or other written materials from your doctor or pharmacy?: 3 - Sometimes(No trouble reading the words but sometimes has trouble with comprehension) What is the last grade level you completed in school?: 12th and some trade school  Interpreter Needed?: No  Information entered by :: Chong Sicilian, RN  Activities of Daily Living In your present state of health, do you have any difficulty performing the following activities: 01/31/2017  Hearing? Y  Comment uses hearing aids and does well with them  Vision? N  Comment Last exam was about 1.5 years ago at [facility]  Difficulty concentrating or making decisions? N  Walking or climbing stairs? N  Comment has basement steps with handrails  Dressing or bathing? N  Doing errands, shopping? N  Preparing Food and eating ? N  Using the Toilet? N  In the past six months, have you accidently leaked urine? N  Do you have problems with loss of bowel control? N  Managing your Medications? N  Comment has a good system in place but doesn't use a pill box  Managing your Finances? N  Housekeeping or managing your Housekeeping? N  Some recent data might be hidden     Immunizations and Health Maintenance Immunization History  Administered Date(s) Administered  . Influenza, High Dose Seasonal PF 10/22/2015, 10/22/2016  . Influenza,inj,Quad PF,6+ Mos 10/20/2012, 10/05/2013, 10/22/2014  . Pneumococcal Conjugate-13 01/31/2017  . Pneumococcal Polysaccharide-23 10/20/2012  . Tdap 08/18/2010  . Zoster 10/05/2006   There are  no preventive care reminders to display for this patient.  Patient Care Team: Dettinger, Fransisca Kaufmann, MD as PCP - General (Family Medicine) Sandford Craze, MD as Referring Physician (Dermatology)  No hospitalizations, ER visits, or surgeries this past year.      Assessment:   This is a routine wellness examination for [place].  Hearing/Vision screen No deficits noted during visit.   Dietary issues and exercise activities discussed: Current Exercise Habits: Home exercise routine, Type of exercise: walking, Time (Minutes): 10, Frequency (Times/Week): 7, Weekly Exercise (Minutes/Week): 70, Intensity: Mild, Exercise limited by: None identified  Goals Walk for 20 minutes a day Chair exercises daily No falls  Depression Screen PHQ 2/9 Scores 01/31/2017 10/22/2016 08/04/2016 04/20/2016  PHQ - 2 Score 0 0 0 0    Fall Risk Fall Risk  01/31/2017 10/22/2016 08/04/2016 04/20/2016 10/22/2015  Falls  in the past year? No No No No Yes  Number falls in past yr: - - - - 1  Injury with Fall? - - - - -  Comment - - - - -    Cognitive Function: MMSE - Mini Mental State Exam 01/31/2017  Orientation to time 4  Orientation to Place 5  Registration 3  Attention/ Calculation 5  Recall 1  Language- name 2 objects 2  Language- repeat 1  Language- follow 3 step command 3  Language- read & follow direction 1  Write a sentence 1  Copy design 0  Total score 26        Screening Tests Health Maintenance  Topic Date Due  . TETANUS/TDAP  08/17/2020  . INFLUENZA VACCINE  Completed  . PNA vac Low Risk Adult  Completed        Plan:   Move carefully to avoid falls F/u with PCP as scheduled to discuss memory concerns Prevnar given today Increase activity level. Chair exercises daily. Handout given.   I have personally reviewed and noted the following in the patient's chart:   . Medical and social history . Use of alcohol, tobacco or illicit drugs  . Current medications and  supplements . Functional ability and status . Nutritional status . Physical activity . Advanced directives . List of other physicians . Hospitalizations, surgeries, and ER visits in previous 12 months . Vitals . Screenings to include cognitive, depression, and falls . Referrals and appointments  In addition, I have reviewed and discussed with patient certain preventive protocols, quality metrics, and best practice recommendations. A written personalized care plan for preventive services as well as general preventive health recommendations were provided to patient.     Chong Sicilian, RN   01/31/2017

## 2017-02-03 ENCOUNTER — Telehealth: Payer: Self-pay | Admitting: Family Medicine

## 2017-02-03 NOTE — Telephone Encounter (Signed)
From 01/31/17

## 2017-02-03 NOTE — Telephone Encounter (Signed)
Urine culture did not grow any bacteria so it does not look like he had infection in the urine.

## 2017-02-03 NOTE — Telephone Encounter (Signed)
pt aware 

## 2017-02-07 ENCOUNTER — Encounter: Payer: Self-pay | Admitting: Family Medicine

## 2017-02-07 ENCOUNTER — Ambulatory Visit (INDEPENDENT_AMBULATORY_CARE_PROVIDER_SITE_OTHER): Payer: Medicare Other | Admitting: Family Medicine

## 2017-02-07 VITALS — BP 133/85 | HR 89 | Temp 98.0°F | Ht 70.5 in | Wt 199.0 lb

## 2017-02-07 DIAGNOSIS — R5383 Other fatigue: Secondary | ICD-10-CM | POA: Diagnosis not present

## 2017-02-07 DIAGNOSIS — R200 Anesthesia of skin: Secondary | ICD-10-CM | POA: Diagnosis not present

## 2017-02-07 DIAGNOSIS — R413 Other amnesia: Secondary | ICD-10-CM

## 2017-02-07 NOTE — Progress Notes (Signed)
BP 133/85   Pulse 89   Temp 98 F (36.7 C) (Oral)   Ht 5' 10.5" (1.791 m)   Wt 199 lb (90.3 kg)   BMI 28.15 kg/m    Subjective:    Patient ID: James Thomas, male    DOB: [birth year], 83 y.o.   MRN: [id number]  HPI: James Thomas is a 83 y.o. male presenting on 02/07/2017 for Confusion at night time, follow up to [facility] (wife reports he has had confusion recently, waking up at 3-4 am wanting breakfast, has had hard time sleeping all week; wife is requesting he have an EKG and chest x-ray since he has not had either in a long time)   HPI Memory issues and finger numbness and fatigue Patient was seen for an annual wellness visit last week and had increased confusion especially at nighttime that is been going on for just over a week.  His wife says he will wake up at 3 or 4 in the morning wanting breakfast and then he has a hard time falling asleep at night as well and has been acting more confused at nighttime.  He had his MMSE last week which showed that he scored 26 out of 30 and did fine during the exam.  He was found to have a possible UTI but culture came back negative but he is still taking the antibiotic of Bactrim and they have not seen that make a difference.  He denies any burning or pain with urination.  He denies any other rashes or cough or congestion or wheezing or anything increased from baseline.  He does have some swelling in his legs but he denies being more than what it has been for him.  They deny any other medication changes.  He denies any major urinary issues at night.  He does say that he has had some numbness on the tips of his fingers of his left hand about 5 days ago but that has resolved and is not there currently.  He is also been feeling fatigued and more tired and less energetic than he normally does.  Wife is concerned because he is just not acting the same that he normally does or having the same energy.  Relevant past medical, surgical, family and social history  reviewed and updated as indicated. Interim medical history since our last visit reviewed. Allergies and medications reviewed and updated.  Review of Systems  Constitutional: Positive for fatigue. Negative for chills and fever.  HENT: Negative for congestion.   Respiratory: Negative for cough, shortness of breath and wheezing.   Cardiovascular: Negative for chest pain and leg swelling.  Gastrointestinal: Negative for abdominal pain.  Musculoskeletal: Negative for back pain and gait problem.  Skin: Negative for rash.  Neurological: Positive for numbness. Negative for dizziness, weakness and light-headedness.  Psychiatric/Behavioral: Positive for confusion and sleep disturbance. Negative for behavioral problems, decreased concentration, dysphoric mood, self-injury and suicidal ideas. The patient is not nervous/anxious.   All other systems reviewed and are negative.   Per HPI unless specifically indicated above   Allergies as of 02/07/2017      Reactions   Fenofibrate Cough   Meloxicam    Unsure but thinks it gave him a rash   Niacin And Related Itching, Rash      Medication List        Accurate as of 02/07/17  9:31 AM. Always use your most recent med list.  amLODipine 5 MG tablet Commonly known as:  NORVASC Take 1 tablet (5 mg total) by mouth daily.   calcium carbonate 1250 MG capsule Take 1,250 mg by mouth 2 (two) times daily with a meal.   cholecalciferol 1000 units tablet Commonly known as:  VITAMIN D Take 2,000 Units by mouth daily.   Fish Oil 1200 MG Caps Take 2 capsules by mouth daily.   levothyroxine 50 MCG tablet Commonly known as:  SYNTHROID, LEVOTHROID Take 1 tablet (50 mcg total) by mouth daily.   losartan 50 MG tablet Commonly known as:  COZAAR Take 1 tablet (50 mg total) by mouth daily.   sulfamethoxazole-trimethoprim 400-80 MG tablet Commonly known as:  BACTRIM,SEPTRA Take 1 tablet by mouth 2 (two) times daily.          Objective:      BP 133/85   Pulse 89   Temp 98 F (36.7 C) (Oral)   Ht 5' 10.5" (1.791 m)   Wt 199 lb (90.3 kg)   BMI 28.15 kg/m   Wt Readings from Last 3 Encounters:  02/07/17 199 lb (90.3 kg)  01/31/17 200 lb (90.7 kg)  10/22/16 195 lb (88.5 kg)    Physical Exam  Constitutional: He is oriented to person, place, and time. He appears well-developed and well-nourished. No distress.  Eyes: Conjunctivae are normal. No scleral icterus.  Neck: Neck supple. No thyromegaly present.  Cardiovascular: Normal rate, regular rhythm, normal heart sounds and intact distal pulses.  No murmur heard. Pulmonary/Chest: Effort normal and breath sounds normal. No respiratory distress. He has no wheezes. He has no rales.  Musculoskeletal: Normal range of motion. He exhibits no edema or tenderness.  Lymphadenopathy:    He has no cervical adenopathy.  Neurological: He is alert and oriented to person, place, and time. He has normal strength. No sensory deficit (No sensory deficit on exam). Coordination normal.  Skin: Skin is warm and dry. No rash noted. He is not diaphoretic.  Psychiatric: He has a normal mood and affect. His behavior is normal.  Nursing note and vitals reviewed.     Assessment & Plan:   Problem List Items Addressed This Visit    None    Visit Diagnoses    Memory deficit    -  Primary   Relevant Orders   CBC with Differential/Platelet   CMP14+EGFR   TSH   Vitamin B12   VITAMIN D 25 Hydroxy (Vit-D Deficiency, Fractures)   Finger numbness       Had all of the fingertips of left hand but is gone now   Relevant Orders   CBC with Differential/Platelet   CMP14+EGFR   TSH   Vitamin B12   VITAMIN D 25 Hydroxy (Vit-D Deficiency, Fractures)   Fatigue, unspecified type       Relevant Orders   CBC with Differential/Platelet   CMP14+EGFR   TSH   Vitamin B12   VITAMIN D 25 Hydroxy (Vit-D Deficiency, Fractures)       Follow up plan: Return if symptoms worsen or fail to improve.  Counseling  provided for all of the vaccine components Orders Placed This Encounter  Procedures  . CBC with Differential/Platelet  . CMP14+EGFR  . TSH  . Vitamin B12  . VITAMIN D 25 Hydroxy (Vit-D Deficiency, Fractures)     , MD [facility] 02/07/2017, 9:31 AM     

## 2017-02-08 LAB — CBC WITH DIFFERENTIAL/PLATELET
BASOS: 1 %
Basophils Absolute: 0.1 10*3/uL (ref 0.0–0.2)
EOS (ABSOLUTE): 0.2 10*3/uL (ref 0.0–0.4)
EOS: 3 %
HEMATOCRIT: 42.8 % (ref 37.5–51.0)
Hemoglobin: 15 g/dL (ref 13.0–17.7)
IMMATURE GRANS (ABS): 0 10*3/uL (ref 0.0–0.1)
IMMATURE GRANULOCYTES: 0 %
LYMPHS: 18 %
Lymphocytes Absolute: 1.2 10*3/uL (ref 0.7–3.1)
MCH: 31.1 pg (ref 26.6–33.0)
MCHC: 35 g/dL (ref 31.5–35.7)
MCV: 89 fL (ref 79–97)
MONOCYTES: 10 %
Monocytes Absolute: 0.7 10*3/uL (ref 0.1–0.9)
NEUTROS PCT: 68 %
Neutrophils Absolute: 4.5 10*3/uL (ref 1.4–7.0)
Platelets: 315 10*3/uL (ref 150–379)
RBC: 4.83 x10E6/uL (ref 4.14–5.80)
RDW: 14.2 % (ref 12.3–15.4)
WBC: 6.7 10*3/uL (ref 3.4–10.8)

## 2017-02-08 LAB — CMP14+EGFR
A/G RATIO: 1.4 (ref 1.2–2.2)
ALT: 26 IU/L (ref 0–44)
AST: 26 IU/L (ref 0–40)
Albumin: 4.1 g/dL (ref 3.5–4.7)
Alkaline Phosphatase: 118 IU/L — ABNORMAL HIGH (ref 39–117)
BUN/Creatinine Ratio: 8 — ABNORMAL LOW (ref 10–24)
BUN: 10 mg/dL (ref 8–27)
Bilirubin Total: 0.6 mg/dL (ref 0.0–1.2)
CALCIUM: 9.8 mg/dL (ref 8.6–10.2)
CO2: 24 mmol/L (ref 20–29)
CREATININE: 1.24 mg/dL (ref 0.76–1.27)
Chloride: 101 mmol/L (ref 96–106)
GFR, EST AFRICAN AMERICAN: 63 mL/min/{1.73_m2} (ref >59)
GFR, EST NON AFRICAN AMERICAN: 55 mL/min/{1.73_m2} — AB (ref >59)
Globulin, Total: 2.9 g/dL (ref 1.5–4.5)
Glucose: 132 mg/dL — ABNORMAL HIGH (ref 65–99)
POTASSIUM: 4.5 mmol/L (ref 3.5–5.2)
Sodium: 139 mmol/L (ref 134–144)
TOTAL PROTEIN: 7 g/dL (ref 6.0–8.5)

## 2017-02-08 LAB — VITAMIN B12: VITAMIN B 12: 248 pg/mL (ref 232–1245)

## 2017-02-08 LAB — VITAMIN D 25 HYDROXY (VIT D DEFICIENCY, FRACTURES): VIT D 25 HYDROXY: 45.3 ng/mL (ref 30.0–100.0)

## 2017-02-08 LAB — TSH: TSH: 4.52 u[IU]/mL — ABNORMAL HIGH (ref 0.450–4.500)

## 2017-02-08 MED ORDER — LEVOTHYROXINE SODIUM 75 MCG PO TABS: 75.0000 ug | ORAL_TABLET | Freq: Every day | ORAL | 1 refills | Status: DC

## 2017-02-08 NOTE — Addendum Note (Signed)
Addended byCarrolyn Leigh on: 02/08/2017 10:37 AM   Modules accepted: Orders

## 2017-02-16 ENCOUNTER — Encounter: Payer: Self-pay | Admitting: Family Medicine

## 2017-02-16 ENCOUNTER — Ambulatory Visit (INDEPENDENT_AMBULATORY_CARE_PROVIDER_SITE_OTHER): Payer: Medicare Other

## 2017-02-16 ENCOUNTER — Telehealth: Payer: Self-pay | Admitting: Family Medicine

## 2017-02-16 ENCOUNTER — Ambulatory Visit (INDEPENDENT_AMBULATORY_CARE_PROVIDER_SITE_OTHER): Payer: Medicare Other | Admitting: Family Medicine

## 2017-02-16 VITALS — BP 136/83 | HR 72 | Temp 97.1°F | Ht 70.5 in | Wt 195.0 lb

## 2017-02-16 DIAGNOSIS — I4510 Unspecified right bundle-branch block: Secondary | ICD-10-CM | POA: Diagnosis not present

## 2017-02-16 DIAGNOSIS — I10 Essential (primary) hypertension: Secondary | ICD-10-CM

## 2017-02-16 DIAGNOSIS — R413 Other amnesia: Secondary | ICD-10-CM

## 2017-02-16 DIAGNOSIS — F17210 Nicotine dependence, cigarettes, uncomplicated: Secondary | ICD-10-CM | POA: Diagnosis not present

## 2017-02-16 DIAGNOSIS — E782 Mixed hyperlipidemia: Secondary | ICD-10-CM | POA: Diagnosis not present

## 2017-02-16 LAB — URINALYSIS, COMPLETE
Bilirubin, UA: NEGATIVE
Glucose, UA: NEGATIVE
Ketones, UA: NEGATIVE
Nitrite, UA: NEGATIVE
PH UA: 7 (ref 5.0–7.5)
PROTEIN UA: NEGATIVE
RBC, UA: NEGATIVE
Specific Gravity, UA: 1.015 (ref 1.005–1.030)
Urobilinogen, Ur: 0.2 mg/dL (ref 0.2–1.0)

## 2017-02-16 LAB — MICROSCOPIC EXAMINATION
BACTERIA UA: NONE SEEN
RENAL EPITHEL UA: NONE SEEN /HPF

## 2017-02-16 NOTE — Telephone Encounter (Signed)
Pt wife is having questions about visit today only wants to speak with nurse. Please advise

## 2017-02-16 NOTE — Progress Notes (Signed)
BP 136/83   Pulse 72   Temp (!) 97.1 F (36.2 C) (Oral)   Ht 5' 10.5" (1.791 m)   Wt 195 lb (88.5 kg)   BMI 27.58 kg/m    Subjective:    Patient ID: James Thomas, male    DOB: 05-Oct-1934, 83 y.o.   MRN: [id number]  HPI: James Thomas is a 83 y.o. male presenting on 02/16/2017 for Hypothyroidism; Hyperlipidemia (wife is requesting he have an EKG and chest xray today); Hypertension; and Fatigue (concerned that B-12 level is at the lower end of normal, has increased Levothyroxine to 75 mcg)   HPI Memory issues Patient is coming in for memory issues today.  His wife is bringing him in and says that he has been doing better over the past week.  He did finish his course of an antibiotic about 5 days ago for his UTI.  She says he is no longer waking up at 3 AM to get breakfast and thinking that the day is ready to go.  She does still notice that he still has some focus and concentration issues and that he gets distracted or lost in his mind easily.  He says the numbness that was in his right hand is but he still has numbness in his left hand and is mostly complaining of numbness in the thumb and index finger.  He denies any weakness or loss of grip strength but because of the numbness he is not able to button his shirt as well.  Is been also having fatigue and decreased energy and we had just increased his levothyroxine 1 week ago for a TSH that was slightly elevated.  His B12 was normal but borderline and she was wondering about that if that could be an issue.  He does take a multivitamin that contains B12.  Hypertension and cholesterol Patient would like an EKG and a chest x-ray is routine maintenance for history of hypertension and cholesterol elevated and a history of smoking in the past.  He denies any current respiratory symptoms but just wants it more for maintenance.  Relevant past medical, surgical, family and social history reviewed and updated as indicated. Interim medical history  since our last visit reviewed. Allergies and medications reviewed and updated.  Review of Systems  Constitutional: Positive for fatigue. Negative for chills and fever.  Respiratory: Negative for shortness of breath and wheezing.   Cardiovascular: Negative for chest pain and leg swelling.  Musculoskeletal: Negative for back pain and gait problem.  Skin: Negative for rash.  Neurological: Positive for numbness. Negative for dizziness, weakness and light-headedness.  Psychiatric/Behavioral: Positive for confusion. Negative for decreased concentration, dysphoric mood, self-injury, sleep disturbance and suicidal ideas.  All other systems reviewed and are negative.  Per HPI unless specifically indicated above   Allergies as of 02/16/2017      Reactions   Fenofibrate Cough   Meloxicam    Unsure but thinks it gave him a rash   Niacin And Related Itching, Rash      Medication List        Accurate as of 02/16/17  9:42 AM. Always use your most recent med list.          amLODipine 5 MG tablet Commonly known as:  NORVASC Take 1 tablet (5 mg total) by mouth daily.   calcium carbonate 1250 MG capsule Take 1,250 mg by mouth 2 (two) times daily with a meal.   CENTRUM SILVER 50+MEN PO Take 1 tablet by  mouth daily.   cholecalciferol 1000 units tablet Commonly known as:  VITAMIN D Take 2,000 Units by mouth daily.   Fish Oil 1200 MG Caps Take 2 capsules by mouth daily.   levothyroxine 75 MCG tablet Commonly known as:  SYNTHROID, LEVOTHROID Take 1 tablet (75 mcg total) by mouth daily.   losartan 50 MG tablet Commonly known as:  COZAAR Take 1 tablet (50 mg total) by mouth daily.   sulfamethoxazole-trimethoprim 400-80 MG tablet Commonly known as:  BACTRIM,SEPTRA Take 1 tablet by mouth 2 (two) times daily.          Objective:    BP 136/83   Pulse 72   Temp (!) 97.1 F (36.2 C) (Oral)   Ht 5' 10.5" (1.791 m)   Wt 195 lb (88.5 kg)   BMI 27.58 kg/m   Wt Readings from Last  3 Encounters:  02/16/17 195 lb (88.5 kg)  02/07/17 199 lb (90.3 kg)  01/31/17 200 lb (90.7 kg)    Physical Exam  Constitutional: He is oriented to person, place, and time. He appears well-developed and well-nourished. No distress.  Eyes: Conjunctivae are normal. No scleral icterus.  Neck: Neck supple. No thyromegaly present.  Cardiovascular: Normal rate, regular rhythm, normal heart sounds and intact distal pulses.  No murmur heard. Pulmonary/Chest: Effort normal and breath sounds normal. No respiratory distress. He has no wheezes. He has no rales.  Musculoskeletal: Normal range of motion. He exhibits no edema.  Lymphadenopathy:    He has no cervical adenopathy.  Neurological: He is alert and oriented to person, place, and time. Coordination normal.  Numbness and left thumb and index finger  Skin: Skin is warm and dry. No rash noted. He is not diaphoretic.  Psychiatric: He has a normal mood and affect. His behavior is normal.  Nursing note and vitals reviewed.   Results for orders placed or performed in visit on 02/07/17  CBC with Differential/Platelet  Result Value Ref Range   WBC 6.7 3.4 - 10.8 x10E3/uL   RBC 4.83 4.14 - 5.80 x10E6/uL   Hemoglobin 15.0 13.0 - 17.7 g/dL   Hematocrit 42.8 37.5 - 51.0 %   MCV 89 79 - 97 fL   MCH 31.1 26.6 - 33.0 pg   MCHC 35.0 31.5 - 35.7 g/dL   RDW 14.2 12.3 - 15.4 %   Platelets 315 150 - 379 x10E3/uL   Neutrophils 68 Not Estab. %   Lymphs 18 Not Estab. %   Monocytes 10 Not Estab. %   Eos 3 Not Estab. %   Basos 1 Not Estab. %   Neutrophils Absolute 4.5 1.4 - 7.0 x10E3/uL   Lymphocytes Absolute 1.2 0.7 - 3.1 x10E3/uL   Monocytes Absolute 0.7 0.1 - 0.9 x10E3/uL   EOS (ABSOLUTE) 0.2 0.0 - 0.4 x10E3/uL   Basophils Absolute 0.1 0.0 - 0.2 x10E3/uL   Immature Granulocytes 0 Not Estab. %   Immature Grans (Abs) 0.0 0.0 - 0.1 x10E3/uL  CMP14+EGFR  Result Value Ref Range   Glucose 132 (H) 65 - 99 mg/dL   BUN 10 8 - 27 mg/dL   Creatinine, Ser  1.24 0.76 - 1.27 mg/dL   GFR calc non Af Amer 55 (L) >59 mL/min/1.73   GFR calc Af Amer 63 >59 mL/min/1.73   BUN/Creatinine Ratio 8 (L) 10 - 24   Sodium 139 134 - 144 mmol/L   Potassium 4.5 3.5 - 5.2 mmol/L   Chloride 101 96 - 106 mmol/L   CO2 24 20 - 29  mmol/L   Calcium 9.8 8.6 - 10.2 mg/dL   Total Protein 7.0 6.0 - 8.5 g/dL   Albumin 4.1 3.5 - 4.7 g/dL   Globulin, Total 2.9 1.5 - 4.5 g/dL   Albumin/Globulin Ratio 1.4 1.2 - 2.2   Bilirubin Total 0.6 0.0 - 1.2 mg/dL   Alkaline Phosphatase 118 (H) 39 - 117 IU/L   AST 26 0 - 40 IU/L   ALT 26 0 - 44 IU/L  TSH  Result Value Ref Range   TSH 4.520 (H) 0.450 - 4.500 uIU/mL  Vitamin B12  Result Value Ref Range   Vitamin B-12 248 232 - 1,245 pg/mL  VITAMIN D 25 Hydroxy (Vit-D Deficiency, Fractures)  Result Value Ref Range   Vit D, 25-Hydroxy 45.3 30.0 - 100.0 ng/mL   EKG: new RBB, will refer to cardiology Urinalysis: 0-5 WBCs, 0-2 RBCs, greater than 10 epithelial cells, otherwise negative Chest x-ray: Clear chest x-ray, await final read from radiology.    Assessment & Plan:   Problem List Items Addressed This Visit      Cardiovascular and Mediastinum   HTN (hypertension) - Primary   Relevant Orders   DG Chest 2 View   EKG 12-Lead (Completed)     Other   HLD (hyperlipidemia)    Other Visit Diagnoses    Memory deficit       Relevant Orders   DG Chest 2 View   Urinalysis, Complete   Urine Culture   Smokes less than 1 pack a day with greater than 10 pack year history       Relevant Orders   DG Chest 2 View   RBBB (right bundle branch block)       Relevant Orders   Ambulatory referral to Cardiology       Follow up plan: Return in about 3 months (around 05/16/2017), or if symptoms worsen or fail to improve, for Recheck thyroid.  Counseling provided for all of the vaccine components Orders Placed This Encounter  Procedures  . DG Chest 2 View  . EKG 12-Lead    Caryl Pina, MD [facility] 02/16/2017, 9:42 AM

## 2017-02-16 NOTE — Telephone Encounter (Signed)
Spoke with pt's wife regarding appt Reviewed ECG results with pt

## 2017-02-17 LAB — URINE CULTURE: ORGANISM ID, BACTERIA: NO GROWTH

## 2017-02-18 ENCOUNTER — Ambulatory Visit: Payer: Medicare Other | Admitting: Cardiology

## 2017-02-22 ENCOUNTER — Encounter: Payer: Self-pay | Admitting: Family Medicine

## 2017-03-11 ENCOUNTER — Ambulatory Visit (INDEPENDENT_AMBULATORY_CARE_PROVIDER_SITE_OTHER): Payer: Medicare Other | Admitting: Cardiology

## 2017-03-11 ENCOUNTER — Encounter: Payer: Self-pay | Admitting: Cardiology

## 2017-03-11 VITALS — BP 146/78 | HR 88 | Ht 71.5 in | Wt 197.4 lb

## 2017-03-11 DIAGNOSIS — R0602 Shortness of breath: Secondary | ICD-10-CM

## 2017-03-11 DIAGNOSIS — R413 Other amnesia: Secondary | ICD-10-CM

## 2017-03-11 DIAGNOSIS — I4510 Unspecified right bundle-branch block: Secondary | ICD-10-CM | POA: Diagnosis not present

## 2017-03-11 NOTE — Patient Instructions (Addendum)
Medication Instructions:  Continue current medications  If you need a refill on your cardiac medications before your next appointment, please call your pharmacy.  Labwork: BNP Today HERE IN OUR OFFICE AT [facility]  Take the provided lab slips for you to take with you to the lab for you blood draw.   You will NOT need to fast   Testing/Procedures: None Ordered   Follow-Up: You have been referred to Neurologist  Your physician wants you to follow-up in: As Needed.     Thank you for choosing [facility] at [facility]!!

## 2017-03-11 NOTE — Progress Notes (Signed)
Cardiology Office Note   Date:  03/11/2017   ID:  James Thomas, DOB 04-15-1934, MRN [id number]  PCP:  Dettinger, Fransisca Kaufmann, MD  Cardiologist:   No primary care provider on file.   Chief Complaint  Patient presents with  . Abnormal ECG      History of Present Illness: James Thomas is a 83 y.o. male who presents for evaluation of an abnormal EKG.  He has no prior cardiac history.  He had a borderline incomplete right bundle branch block in 2013 and I reviewed this EKG.  He is sent now because he has a right bundle branch block.  He had an acute memory change in January.  It was thought maybe he had a UTI but this was not confirmed.  He became confused about his times and was going to bed at an unusual time.  He became less conversational.  He did not have any focal motor deficits or visual changes or slurred speech although may be a little less comprehensible.  The etiology of this is still not clear and he is not resolved completely though he is slightly better with his time orientation.  The EKG was part of his workup.  He had blood work to include a normal thyroid and no acute electrolyte abnormalities.  He has had no palpitations, presyncope or syncope.  He said no chest pressure, neck or arm discomfort.  He has had a little numbness in his left hand and left thumb and increased somnolence.   Past Medical History:  Diagnosis Date  . Arthritis   . Hypercholesteremia   . Hypertension   . Hypothyroidism   . Osteoporosis     Past Surgical History:  Procedure Laterality Date  . BACK SURGERY  1979;1985   slipped disc;ruptured disc  . CATARACT EXTRACTION W/PHACO  02/01/2011   Procedure: CATARACT EXTRACTION PHACO AND INTRAOCULAR LENS PLACEMENT (IOC);  Surgeon: Tonny Branch, MD;  Location: [facility];  Service: Ophthalmology;  Laterality: Right;  CDE:18.53     Current Outpatient Medications  Medication Sig Dispense Refill  . amLODipine (NORVASC) 5 MG tablet Take 1 tablet (5 mg  total) by mouth daily. 90 tablet 3  . calcium carbonate 1250 MG capsule Take 1,250 mg by mouth 2 (two) times daily with a meal.    . cholecalciferol (VITAMIN D) 1000 UNITS tablet Take 2,000 Units by mouth daily.     Marland Kitchen levothyroxine (SYNTHROID, LEVOTHROID) 75 MCG tablet Take 1 tablet (75 mcg total) by mouth daily. 90 tablet 1  . losartan (COZAAR) 50 MG tablet Take 1 tablet (50 mg total) by mouth daily. 90 tablet 1  . Multiple Vitamins-Minerals (CENTRUM SILVER 50+MEN PO) Take 1 tablet by mouth daily.    . Omega-3 Fatty Acids (FISH OIL) 1200 MG CAPS Take 2 capsules by mouth daily.     No current facility-administered medications for this visit.     Allergies:   Fenofibrate; Meloxicam; and Niacin and related    Social History:  The patient  reports that he quit smoking about 45 years ago. His smoking use included cigarettes. He has a 10.00 pack-year smoking history. He uses smokeless tobacco. He reports that he does not drink alcohol or use drugs.   Family History:  The patient's family history includes Diabetes in his father; Heart attack in his maternal grandmother; Heart attack (age of onset: 80) in his brother.    Most of the family "died of old age."    ROS:  Please see the history of present illness.   Otherwise, review of systems are positive for none.   All other systems are reviewed and negative.    PHYSICAL EXAM: VS:  BP (!) 146/78 (BP Location: Left Arm)   Pulse 88   Ht 5' 11.5" (1.816 m)   Wt 197 lb 6.4 oz (89.5 kg)   SpO2 97%   BMI 27.15 kg/m  , BMI Body mass index is 27.15 kg/m. GENERAL:  Well appearing HEENT:  Pupils equal round and reactive, fundi not visualized, oral mucosa unremarkable NECK:  No jugular venous distention, waveform within normal limits, carotid upstroke brisk and symmetric, no bruits, no thyromegaly LYMPHATICS:  No cervical, inguinal adenopathy LUNGS:  Clear to auscultation bilaterally BACK:  No CVA tenderness CHEST:  Unremarkable HEART:  PMI not  displaced or sustained,S1 and S2 within normal limits, no S3, no S4, no clicks, no rubs, no murmurs ABD:  Flat, positive bowel sounds normal in frequency in pitch, no bruits, no rebound, no guarding, no midline pulsatile mass, no hepatomegaly, no splenomegaly, umbilical hernia EXT:  2 plus pulses throughout, no edema, no cyanosis no clubbing SKIN:  No rashes no nodules NEURO:  Cranial nerves II through XII grossly intact, motor grossly intact throughout PSYCH:  Cognitively intact, oriented to person place and time    EKG:  EKG is not ordered today. The ekg ordered 02/16/17 demonstrates sinus rhythm, rate 69, right bundle branch block, no acute ST-T wave changes.   Recent Labs: 02/07/2017: ALT 26; BUN 10; Creatinine, Ser 1.24; Hemoglobin 15.0; Platelets 315; Potassium 4.5; Sodium 139; TSH 4.520    Lipid Panel    Component Value Date/Time   CHOL 214 (H) 10/22/2016 1113   TRIG 239 (H) 10/22/2016 1113   TRIG 98 08/17/2012 0842   HDL 28 (L) 10/22/2016 1113   HDL 44 08/17/2012 0842   CHOLHDL 7.6 (H) 10/22/2016 1113   LDLCALC 138 (H) 10/22/2016 1113   LDLCALC 103 (H) 08/17/2012 0842      Wt Readings from Last 3 Encounters:  03/11/17 197 lb 6.4 oz (89.5 kg)  02/16/17 195 lb (88.5 kg)  02/07/17 199 lb (90.3 kg)      Other studies Reviewed: Additional studies/ records that were reviewed today include: None. Review of the above records demonstrates:  Please see elsewhere in the note.     ASSESSMENT AND PLAN:  RBBB:   The patient is found to have right bundle branch block.  I do not know how long this is been there but it was not there in 2013.  However, I do not think this is related to his new confusion.  He is not having any chest pain.  He does have a little shortness of breath..  He has an unremarkable physical exam.  At this point I will check a BNP level.  However, if this is normal I would not suggest further cardiovascular testing.   Current medicines are reviewed at length  with the patient today.  The patient does not have concerns regarding medicines.  The following changes have been made:  no change  Labs/ tests ordered today include:   Orders Placed This Encounter  Procedures  . B Nat Peptide  . Ambulatory referral to Neurology     Disposition:   FU with me as needed.     Signed, Minus Breeding, MD  03/11/2017 3:04 PM    [facility]

## 2017-03-12 LAB — BRAIN NATRIURETIC PEPTIDE: BNP: 42.6 pg/mL (ref 0.0–100.0)

## 2017-03-14 ENCOUNTER — Encounter: Payer: Self-pay | Admitting: Neurology

## 2017-03-21 ENCOUNTER — Ambulatory Visit: Payer: Medicare Other | Admitting: Cardiology

## 2017-03-23 ENCOUNTER — Telehealth: Payer: Self-pay | Admitting: *Deleted

## 2017-03-23 MED ORDER — LOSARTAN POTASSIUM 100 MG PO TABS: ORAL_TABLET | ORAL | 1 refills | Status: DC

## 2017-03-23 NOTE — Telephone Encounter (Signed)
Go ahead and change to 100 mg and he only takes half of it and send him a month supply

## 2017-03-23 NOTE — Telephone Encounter (Signed)
Fax received James Thomas Alternative requested Losartan 50 mg on back order Please advise on alternative or change to 100 mg

## 2017-03-31 DIAGNOSIS — Z961 Presence of intraocular lens: Secondary | ICD-10-CM | POA: Diagnosis not present

## 2017-03-31 DIAGNOSIS — H25812 Combined forms of age-related cataract, left eye: Secondary | ICD-10-CM | POA: Diagnosis not present

## 2017-03-31 DIAGNOSIS — H5203 Hypermetropia, bilateral: Secondary | ICD-10-CM | POA: Diagnosis not present

## 2017-03-31 DIAGNOSIS — H43813 Vitreous degeneration, bilateral: Secondary | ICD-10-CM | POA: Diagnosis not present

## 2017-04-21 ENCOUNTER — Ambulatory Visit: Payer: Medicare Other | Admitting: Family Medicine

## 2017-04-25 ENCOUNTER — Other Ambulatory Visit: Payer: Self-pay | Admitting: Family Medicine

## 2017-05-19 ENCOUNTER — Ambulatory Visit (INDEPENDENT_AMBULATORY_CARE_PROVIDER_SITE_OTHER): Payer: Medicare Other | Admitting: Family Medicine

## 2017-05-19 ENCOUNTER — Encounter: Payer: Self-pay | Admitting: Family Medicine

## 2017-05-19 VITALS — BP 131/72 | HR 72 | Temp 96.9°F | Ht 71.5 in | Wt 195.0 lb

## 2017-05-19 DIAGNOSIS — E782 Mixed hyperlipidemia: Secondary | ICD-10-CM

## 2017-05-19 DIAGNOSIS — E663 Overweight: Secondary | ICD-10-CM | POA: Diagnosis not present

## 2017-05-19 DIAGNOSIS — E039 Hypothyroidism, unspecified: Secondary | ICD-10-CM

## 2017-05-19 DIAGNOSIS — R413 Other amnesia: Secondary | ICD-10-CM | POA: Diagnosis not present

## 2017-05-19 DIAGNOSIS — I10 Essential (primary) hypertension: Secondary | ICD-10-CM

## 2017-05-19 DIAGNOSIS — R7303 Prediabetes: Secondary | ICD-10-CM | POA: Diagnosis not present

## 2017-05-19 LAB — BAYER DCA HB A1C WAIVED: HB A1C (BAYER DCA - WAIVED): 5.8 % (ref <7.0)

## 2017-05-19 NOTE — Progress Notes (Signed)
BP 131/72   Pulse 72   Temp (!) 96.9 F (36.1 C) (Oral)   Ht 5' 11.5" (1.816 m)   Wt 195 lb (88.5 kg)   BMI 26.82 kg/m    Subjective:    Patient ID: James Thomas, male    DOB: [birth year], 83 y.o.   MRN: [id number]  HPI: James Thomas is a 83 y.o. male presenting on 05/19/2017 for Medical Management of Chronic Issues   HPI Hypothyroidism recheck Patient is coming in for thyroid recheck today as well. They deny any issues with hair changes or heat or cold problems or diarrhea or constipation. They deny any chest pain or palpitations. They are currently on levothyroxine 51mcrograms   Hypertension Patient is currently on amlodipine and losartan, and their blood pressure today is 131/72. Patient denies any lightheadedness or dizziness. Patient denies headaches, blurred vision, chest pains, shortness of breath, or weakness. Denies any side effects from medication and is content with current medication.   Hyperlipidemia Patient is coming in for recheck of his hyperlipidemia. The patient is currently taking omega-3's, has been intolerant of statins fenofibrate and niacin. They deny any issues with myalgias or history of liver damage from it. They deny any focal numbness or weakness or chest pain.   Prediabetes Patient comes in today for recheck of his diabetes. Patient has been currently taking no medication is been monitored for diet control. Patient is currently on an ACE inhibitor/ARB. Patient has not seen an ophthalmologist this year. Patient denies any issues with their feet.   Patient has continued to have memory issues but does see a neurologist next week, is already been evaluated by cardiology and said his heart looks good but will go see them to discuss possible issues with his memory and possible dementia.  Relevant past medical, surgical, family and social history reviewed and updated as indicated. Interim medical history since our last visit reviewed. Allergies and  medications reviewed and updated.  Review of Systems  Constitutional: Positive for fatigue. Negative for chills and fever.  Eyes: Negative for discharge.  Respiratory: Negative for shortness of breath and wheezing.   Cardiovascular: Negative for chest pain, palpitations and leg swelling.  Musculoskeletal: Negative for back pain and gait problem.  Skin: Negative for rash.  Neurological: Negative for dizziness, tremors, weakness and numbness.  Psychiatric/Behavioral: Positive for confusion and sleep disturbance (Still awake and sometimes early morning and thinks 30 time to go, and has improved). Negative for decreased concentration, dysphoric mood, self-injury and suicidal ideas. The patient is not nervous/anxious.   All other systems reviewed and are negative.   Per HPI unless specifically indicated above   Allergies as of 05/19/2017      Reactions   Fenofibrate Cough   Meloxicam    Unsure but thinks it gave him a rash   Niacin And Related Itching, Rash      Medication List        Accurate as of 05/19/17  9:12 AM. Always use your most recent med list.          amLODipine 5 MG tablet Commonly known as:  NORVASC TAKE 1 TABLET BY MOUTH ONCE DAILY   calcium carbonate 1250 MG capsule Take 1,250 mg by mouth 2 (two) times daily with a meal.   CENTRUM SILVER 50+MEN PO Take 1 tablet by mouth daily.   cholecalciferol 1000 units tablet Commonly known as:  VITAMIN D Take 2,000 Units by mouth daily.   Fish Oil 1200 MG Caps  Take 2 capsules by mouth daily.   levothyroxine 75 MCG tablet Commonly known as:  SYNTHROID, LEVOTHROID Take 1 tablet (75 mcg total) by mouth daily.   losartan 100 MG tablet Commonly known as:  COZAAR Take 1/2 tablet daily          Objective:    BP 131/72   Pulse 72   Temp (!) 96.9 F (36.1 C) (Oral)   Ht 5' 11.5" (1.816 m)   Wt 195 lb (88.5 kg)   BMI 26.82 kg/m   Wt Readings from Last 3 Encounters:  05/19/17 195 lb (88.5 kg)  03/11/17 197  lb 6.4 oz (89.5 kg)  02/16/17 195 lb (88.5 kg)    Physical Exam  Constitutional: He is oriented to person, place, and time. He appears well-developed and well-nourished. No distress.  Eyes: Conjunctivae are normal. No scleral icterus.  Neck: Neck supple. No thyromegaly present.  Cardiovascular: Normal rate, regular rhythm, normal heart sounds and intact distal pulses.  No murmur heard. Pulmonary/Chest: Effort normal and breath sounds normal. No respiratory distress. He has no wheezes.  Musculoskeletal: Normal range of motion. He exhibits no edema.  Lymphadenopathy:    He has no cervical adenopathy.  Neurological: He is alert and oriented to person, place, and time. Coordination normal.  Skin: Skin is warm and dry. No rash noted. He is not diaphoretic.  Psychiatric: He has a normal mood and affect. His behavior is normal. He expresses no suicidal ideation. He expresses no suicidal plans. He exhibits abnormal recent memory. He exhibits normal remote memory.  Nursing note and vitals reviewed.       Assessment & Plan:   Problem List Items Addressed This Visit      Cardiovascular and Mediastinum   HTN (hypertension)   Relevant Orders   CMP14+EGFR     Endocrine   Hypothyroid   Relevant Orders   TSH     Other   HLD (hyperlipidemia)   Prediabetes - Primary   Relevant Orders   CMP14+EGFR   Bayer DCA Hb A1c Waived   Overweight (BMI 25.0-29.9)    Other Visit Diagnoses    Memory problem       Sees neurology next week, likely need evaluation for dementia   Relevant Orders   Vitamin B12     We will send to neurology for memory issues, continue other current medications.  We will check labs and see if we need to adjust thyroid  Follow up plan: Return in about 3 months (around 08/19/2017), or if symptoms worsen or fail to improve, for recheck prediabetes and hypertension.  Counseling provided for all of the vaccine components Orders Placed This Encounter  Procedures  .  CMP14+EGFR  . TSH  . [facility] Hb A1c St[place], MD [facility] 05/19/2017, 9:12 AM

## 2017-05-20 LAB — CMP14+EGFR
ALBUMIN: 4.3 g/dL (ref 3.5–4.7)
ALT: 16 IU/L (ref 0–44)
AST: 22 IU/L (ref 0–40)
Albumin/Globulin Ratio: 1.7 (ref 1.2–2.2)
Alkaline Phosphatase: 101 IU/L (ref 39–117)
BUN/Creatinine Ratio: 5 — ABNORMAL LOW (ref 10–24)
BUN: 5 mg/dL — AB (ref 8–27)
Bilirubin Total: 1.1 mg/dL (ref 0.0–1.2)
CO2: 26 mmol/L (ref 20–29)
CREATININE: 1.06 mg/dL (ref 0.76–1.27)
Calcium: 9.8 mg/dL (ref 8.6–10.2)
Chloride: 102 mmol/L (ref 96–106)
GFR calc non Af Amer: 66 mL/min/{1.73_m2} (ref >59)
GFR, EST AFRICAN AMERICAN: 76 mL/min/{1.73_m2} (ref >59)
Globulin, Total: 2.6 g/dL (ref 1.5–4.5)
Glucose: 117 mg/dL — ABNORMAL HIGH (ref 65–99)
Potassium: 4 mmol/L (ref 3.5–5.2)
Sodium: 141 mmol/L (ref 134–144)
TOTAL PROTEIN: 6.9 g/dL (ref 6.0–8.5)

## 2017-05-20 LAB — VITAMIN B12: VITAMIN B 12: 234 pg/mL (ref 232–1245)

## 2017-05-20 LAB — TSH: TSH: 2.19 u[IU]/mL (ref 0.450–4.500)

## 2017-05-25 ENCOUNTER — Telehealth: Payer: Self-pay | Admitting: Family Medicine

## 2017-05-25 ENCOUNTER — Encounter: Payer: Self-pay | Admitting: *Deleted

## 2017-05-25 DIAGNOSIS — M1712 Unilateral primary osteoarthritis, left knee: Secondary | ICD-10-CM | POA: Diagnosis not present

## 2017-05-25 DIAGNOSIS — M25562 Pain in left knee: Secondary | ICD-10-CM | POA: Diagnosis not present

## 2017-05-25 NOTE — Telephone Encounter (Signed)
Aware.  Copy of lab results at front for them.

## 2017-05-27 ENCOUNTER — Other Ambulatory Visit: Payer: Self-pay

## 2017-05-27 ENCOUNTER — Encounter: Payer: Self-pay | Admitting: Neurology

## 2017-05-27 ENCOUNTER — Ambulatory Visit (INDEPENDENT_AMBULATORY_CARE_PROVIDER_SITE_OTHER): Payer: Medicare Other | Admitting: Neurology

## 2017-05-27 VITALS — BP 146/74 | HR 75 | Ht 72.0 in | Wt 193.0 lb

## 2017-05-27 DIAGNOSIS — R400 Somnolence: Secondary | ICD-10-CM | POA: Diagnosis not present

## 2017-05-27 DIAGNOSIS — G3184 Mild cognitive impairment, so stated: Secondary | ICD-10-CM | POA: Diagnosis not present

## 2017-05-27 DIAGNOSIS — E538 Deficiency of other specified B group vitamins: Secondary | ICD-10-CM

## 2017-05-27 NOTE — Progress Notes (Signed)
NEUROLOGY CONSULTATION NOTE  PAU BANH MRN: [id number] DOB: Jul 27, 1934  Referring provider: Dr. Minus Breeding Primary care provider: Dr. Vonna Kotyk Dettinger  Reason for consult:  Memory loss  Dear Dr Percival Spanish:  Thank you for your kind referral of [name] for consultation of the above symptoms. Although his history is well known to you, please allow me to reiterate it for the purpose of our medical record. The patient was accompanied to the clinic by his wife and daughter who also provide collateral information. Records and images were personally reviewed where available.  HISTORY OF PRESENT ILLNESS: This is a pleasant 83 year old right-handed man with a history of hypertension, hyperlipidemia, hypothyroidism, RBBB, presenting for evaluation of memory loss. He feels his memory is pretty fair, "there are some things I can't remember." His wife and daughter started noticing minor memory changes for a year or so, but he had significant changes in both memory and behavior in January 2019. He went from sleeping at 10pm to suddenly changing his sleep schedule to 8pm. In January and February, he would wake up at 3am and tell his wife he was hungry, asking for breakfast. They also started to notice focusing difficulties, he would watch TV and be unable to relate the storyline. his wife has to repeat instructions, part of it may be poor hearing. Family has also noticed that he does not engage in conversation unless you are talking straight to him. He would usually fall asleep if not stimulated, sleeping in his recliner during the day. His family noticed some hesitation remembering where he is going when driving the car, but as far as they know he has not gotten lost. He manages his medications without difficulties. His wife is in charge of finances. Family has noticed he is more meek/mild, no paranoia or irritability. Sometimes he hears a motor running outside the house, mostly at night. In  January there were a few spells where he was convinced someone was in the house and he would have to kil them. Family brought him to his PCP that time and he was initially thought to have a UTI and briefly treated with an antibiotic, but after results came back negative, this was stopped. His TSH at that time was mildly elevated, thyroid dose was adjusted. His B12 level was 248. Repeat TSH this month is normal, B12 238.   He denies any headaches, dizziness, diplopia, dysarthria, neck/back pain, focal weakness, bladder dysfunction, anosmia, or tremors. He has constipation, occasional difficulty swallowing, and constant numbness in the 1st and 2nd digits of his left hand for the past 6 months, no pain. He needs help with buttoning his clothes but overall is independent with dressing and bathing. He has difficulty with sleep initiation. He mostly watches TV and gets little exercise. He reports feeling tired all the time and does not feel like himself. His family relates that he had been working regularly with his brother up to 2 years ago, occasionally up to 1 year ago when his brother got sick. No family history of dementia, no history of significant head injuries or alcohol intake.   PAST MEDICAL HISTORY: Past Medical History:  Diagnosis Date  . Arthritis   . Hypercholesteremia   . Hypertension   . Hypothyroidism   . Osteoporosis     PAST SURGICAL HISTORY: Past Surgical History:  Procedure Laterality Date  . BACK SURGERY  1979;1985   slipped disc;ruptured disc  . CATARACT EXTRACTION W/PHACO  02/01/2011   Procedure:  CATARACT EXTRACTION PHACO AND INTRAOCULAR LENS PLACEMENT (IOC);  Surgeon: Tonny Branch, MD;  Location: [facility];  Service: Ophthalmology;  Laterality: Right;  CDE:18.53    MEDICATIONS: Current Outpatient Medications on File Prior to Visit  Medication Sig Dispense Refill  . amLODipine (NORVASC) 5 MG tablet TAKE 1 TABLET BY MOUTH ONCE DAILY 90 tablet 1  . calcium carbonate 1250 MG  capsule Take 1,250 mg by mouth 2 (two) times daily with a meal.    . cholecalciferol (VITAMIN D) 1000 UNITS tablet Take 2,000 Units by mouth daily.     Marland Kitchen levothyroxine (SYNTHROID, LEVOTHROID) 75 MCG tablet Take 1 tablet (75 mcg total) by mouth daily. 90 tablet 1  . losartan (COZAAR) 100 MG tablet Take 1/2 tablet daily 90 tablet 1  . Multiple Vitamins-Minerals (CENTRUM SILVER 50+MEN PO) Take 1 tablet by mouth daily.    . Omega-3 Fatty Acids (FISH OIL) 1200 MG CAPS Take 2 capsules by mouth daily.     No current facility-administered medications on file prior to visit.     ALLERGIES: Allergies  Allergen Reactions  . Fenofibrate Cough  . Meloxicam     Unsure but thinks it gave him a rash  . Niacin And Related Itching and Rash    FAMILY HISTORY: Family History  Problem Relation Age of Onset  . Diabetes Father   . Heart attack Maternal Grandmother   . Heart attack Brother 73  . Anesthesia problems Neg Hx   . Hypotension Neg Hx   . Malignant hyperthermia Neg Hx   . Pseudochol deficiency Neg Hx     SOCIAL HISTORY: Social History   Socioeconomic History  . Marital status: Married    Spouse name: Not on file  . Number of children: 1  . Years of education: 14 years  . Highest education level: Associate degree: occupational, Hotel manager, or vocational program  Occupational History  . Occupation: Retired    Comment: Engineer, building services, bodywork  Social Needs  . Financial resource strain: Not hard at all  . Food insecurity:    Worry: Never true    Inability: Never true  . Transportation needs:    Medical: No    Non-medical: No  Tobacco Use  . Smoking status: Former Smoker    Packs/day: 1.00    Years: 10.00    Pack years: 10.00    Types: Cigarettes    Last attempt to quit: 01/27/1972    Years since quitting: 45.3  . Smokeless tobacco: Current User  Substance and Sexual Activity  . Alcohol use: No  . Drug use: No  . Sexual activity: Never  Lifestyle  . Physical activity:     Days per week: 0 days    Minutes per session: 0 min  . Stress: Not at all  Relationships  . Social connections:    Talks on phone: More than three times a week    Gets together: More than three times a week    Attends religious service: More than 4 times per year    Active member of club or organization: Yes    Attends meetings of clubs or organizations: More than 4 times per year    Relationship status: Married  . Intimate partner violence:    Fear of current or ex partner: No    Emotionally abused: No    Physically abused: No    Forced sexual activity: No  Other Topics Concern  . 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 He is married and lives in a one story home with a basement with his wife. They have one adult daughter and two granddaughters.     REVIEW OF SYSTEMS: Constitutional: No fevers, chills, or sweats, + generalized fatigue, no change in appetite Eyes: No visual changes, double vision, eye pain Ear, nose and throat: No hearing loss, ear pain, nasal congestion, sore throat Cardiovascular: No chest pain, palpitations Respiratory:  No shortness of breath at rest or with exertion, wheezes GastrointestinaI: No nausea, vomiting, diarrhea, abdominal pain, fecal incontinence Genitourinary:  No dysuria, urinary retention or frequency Musculoskeletal:  No neck pain, back pain Integumentary: No rash, pruritus, skin lesions Neurological: as above Psychiatric: No depression, insomnia, anxiety Endocrine: No palpitations,+ fatigue, diaphoresis,no mood swings, change in appetite, change in weight, increased thirst Hematologic/Lymphatic:  No anemia, purpura, petechiae. Allergic/Immunologic: no itchy/runny eyes, nasal congestion, recent allergic reactions, rashes  PHYSICAL EXAM: Vitals:   05/27/17 0905  BP: (!) 146/74  Pulse: 75  SpO2: 96%   General: No acute distress Head:   Normocephalic/atraumatic Eyes: Fundoscopic exam shows bilateral sharp discs, no vessel changes, exudates, or hemorrhages Neck: supple, no paraspinal tenderness, full range of motion Back: No paraspinal tenderness Heart: regular rate and rhythm Lungs: Clear to auscultation bilaterally. Vascular: No carotid bruits. Skin/Extremities: No rash, no edema Neurological Exam: Mental status: alert and oriented to person, place, and time, no dysarthria or aphasia, Fund of knowledge is appropriate.  Recent and remote memory are impaired.  Attention and concentration are normal.    Able to name objects and repeat phrases. CDT 5/5 MMSE - Mini Mental State Exam 05/27/2017 01/31/2017  Orientation to time 4 4  Orientation to Place 4 5  Registration 3 3  Attention/ Calculation 5 5  Recall 1 1  Language- name 2 objects 2 2  Language- repeat 1 1  Language- follow 3 step command 3 3  Language- read & follow direction 1 1  Write a sentence 1 1  Copy design 1 0  Total score 26 26   Cranial nerves: CN I: not tested CN II: pupils equal, round and reactive to light, visual fields intact, fundi unremarkable. CN III, IV, VI:  full range of motion, no nystagmus, no ptosis CN V: facial sensation intact CN VII: upper and lower face symmetric CN VIII: hearing intact to conversation CN IX, X: gag intact, uvula midline CN XI: sternocleidomastoid and trapezius muscles intact CN XII: tongue midline Bulk & Tone: normal, no fasciculations. Motor: 5/5 throughout with no pronator drift. Sensation: intact to light touch, cold, pin on both UE and LE. Decreased vibration to knees bilaterally. No extinction to double simultaneous stimulation.  Romberg test negative Deep Tendon Reflexes: +1 throughout, no ankle clonus Plantar responses: downgoing bilaterally Cerebellar: no incoordination on finger to nose testing Gait: slightly wide-based, cautious, no ataxia, unable to tandem walk Tremor: none  IMPRESSION: This is a  pleasant 83 year old right-handed man with a history of hypertension, hyperlipidemia, hypothyroidism, RBBB, presenting for evaluation of memory and behavioral changes that became significantly more noticeable in January 2019. His neurological exam shows evidence of a mild length-dependent neuropathy, no focal features, MMSE 26/30. Symptoms suggestive of mild cognitive impairment. We discussed different causes of memory changes, his family reports adjustment in his thyroid dose around the time of these symptoms. His B12 level is low, with neurological symptoms, would recommend a B12 level >400. I discussed doing B12 injections with  them, family would like this done at his PCP office. We usually do 1060mcg daily for a week, then weekly for a month, then monthly for a year. MRI brain without contrast will be ordered to assess for underlying structural abnormality. They are also concerned about daytime drowsiness, a home sleep study will be ordered as sleep apnea can also cause cognitive changes. We also discussed how mood can affect cognition and behavior, it seems like changes also started around the time he stopped doing work with his brother. Continue to monitor mood, would consider an SSRI. We discussed the importance of control of vascular risk factors, physical exercise, and brain stimulation exercises for brain health. He will follow-up in 6 months and knows to call for any changes.   Thank you for allowing me to participate in the care of this patient. Please do not hesitate to call for any questions or concerns.   Ellouise Newer, M.D.  CC: Dr. Percival Spanish, Dr. Warrick Parisian

## 2017-05-27 NOTE — Patient Instructions (Addendum)
1. Schedule MRI brain without contrast  We have sent a referral to [facility] for your MRI and they will call you directly to schedule your appt. They are located at [place]. If you need to contact them directly please call [phone].   2. Schedule home sleep study 3. Start B12 injections with Dr. Merita Norton office 4. Continue to monitor mood, this can also affect memory 5. Follow-up in 6 months, call for any changes   RECOMMENDATIONS FOR ALL PATIENTS WITH MEMORY PROBLEMS: 1. Continue to exercise (Recommend 30 minutes of walking everyday, or 3 hours every week) 2. Increase social interactions - continue going to [place] and enjoy social gatherings with friends and family 3. Eat healthy, avoid fried foods and eat more fruits and vegetables 4. Maintain adequate blood pressure, blood sugar, and blood cholesterol level. Reducing the risk of stroke and cardiovascular disease also helps promoting better memory. 5. Avoid stressful situations. Live a simple life and avoid aggravations. Organize your time and prepare for the next day in anticipation. 6. Sleep well, avoid any interruptions of sleep and avoid any distractions in the bedroom that may interfere with adequate sleep quality 7. Avoid sugar, avoid sweets as there is a strong link between excessive sugar intake, diabetes, and cognitive impairment We discussed the Mediterranean diet, which has been shown to help patients reduce the risk of progressive memory disorders and reduces cardiovascular risk. This includes eating fish, eat fruits and green leafy vegetables, nuts like almonds and hazelnuts, walnuts, and also use olive oil. Avoid fast foods and fried foods as much as possible. Avoid sweets and sugar as sugar use has been linked to worsening of memory function.

## 2017-06-07 ENCOUNTER — Other Ambulatory Visit: Payer: Self-pay | Admitting: *Deleted

## 2017-06-07 DIAGNOSIS — E538 Deficiency of other specified B group vitamins: Secondary | ICD-10-CM

## 2017-06-07 MED ORDER — CYANOCOBALAMIN 1000 MCG/ML IJ SOLN: 1000.0000 ug | Freq: Once | INTRAMUSCULAR | Status: DC

## 2017-06-07 MED ORDER — CYANOCOBALAMIN 1000 MCG/ML IJ SOLN
1000.0000 ug | Freq: Once | INTRAMUSCULAR | Status: AC
  Administered 2017-06-13: 1000 ug via INTRAMUSCULAR

## 2017-06-07 MED ORDER — CYANOCOBALAMIN 1000 MCG/ML IJ SOLN
1000.0000 ug | Freq: Once | INTRAMUSCULAR | Status: AC
Start: 2017-06-07 — End: 2017-06-14
  Administered 2017-06-14: 1000 ug via INTRAMUSCULAR

## 2017-06-07 MED ORDER — CYANOCOBALAMIN 1000 MCG/ML IJ SOLN: 1000.0000 ug | INTRAMUSCULAR | 0 refills | Status: DC

## 2017-06-07 MED ORDER — CYANOCOBALAMIN 1000 MCG/ML IJ SOLN
1000.0000 ug | INTRAMUSCULAR | Status: DC
  Administered 2017-06-15 – 2017-10-19 (×10): 1000 ug via INTRAMUSCULAR

## 2017-06-07 MED ORDER — CYANOCOBALAMIN 1000 MCG/ML IJ SOLN: 1000.0000 ug | Freq: Every day | INTRAMUSCULAR | 0 refills | Status: DC

## 2017-06-07 NOTE — Telephone Encounter (Signed)
Spoke with pharmacy and advised them that they should disregard the prescription for B12 that was sent to them.

## 2017-06-07 NOTE — Telephone Encounter (Signed)
Calling to talk to nurse about cyanocobalamin (,VITAMIN B-12,) 1000 MCG/ML injection. Please clarify directions and quanities

## 2017-06-07 NOTE — Telephone Encounter (Signed)
Evangeline Dakin daughter-calling to talk to Dettinger about pt appt with neurologist.

## 2017-06-07 NOTE — Telephone Encounter (Signed)
Patient saw neurologist and she recommended B12 injections.  Daughter [name] calling to set this up.  Appointments were made for patient according to neurology recommendation of B12 daily x 1 week, B12 injection weekly x 1 month, B12 injection monthly thereafter.

## 2017-06-13 ENCOUNTER — Ambulatory Visit (INDEPENDENT_AMBULATORY_CARE_PROVIDER_SITE_OTHER): Payer: Medicare Other | Admitting: *Deleted

## 2017-06-13 ENCOUNTER — Ambulatory Visit: Payer: Medicare Other

## 2017-06-13 DIAGNOSIS — E538 Deficiency of other specified B group vitamins: Secondary | ICD-10-CM | POA: Diagnosis not present

## 2017-06-13 NOTE — Progress Notes (Signed)
Pt given Cyancobalamin inj Tolerated well 

## 2017-06-14 ENCOUNTER — Ambulatory Visit (INDEPENDENT_AMBULATORY_CARE_PROVIDER_SITE_OTHER): Payer: Medicare Other

## 2017-06-14 DIAGNOSIS — E538 Deficiency of other specified B group vitamins: Secondary | ICD-10-CM

## 2017-06-14 NOTE — Progress Notes (Signed)
B12 injection given to right deltoid.  Patient tolerated well. 

## 2017-06-14 NOTE — Patient Instructions (Signed)
Cyanocobalamin, Vitamin B12 injection What is this medicine? CYANOCOBALAMIN (sye an oh koe BAL a min) is a man made form of vitamin B12. Vitamin B12 is used in the growth of healthy blood cells, nerve cells, and proteins in the body. It also helps with the metabolism of fats and carbohydrates. This medicine is used to treat people who can not absorb vitamin B12. This medicine may be used for other purposes; ask your health care provider or pharmacist if you have questions. COMMON BRAND NAME(S): B-12 Compliance Kit, B-12 Injection Kit, Cyomin, LA-12, Nutri-Twelve, Physicians EZ Use B-12, Primabalt What should I tell my health care provider before I take this medicine? They need to know if you have any of these conditions: -kidney disease -Leber's disease -megaloblastic anemia -an unusual or allergic reaction to cyanocobalamin, cobalt, other medicines, foods, dyes, or preservatives -pregnant or trying to get pregnant -breast-feeding How should I use this medicine? This medicine is injected into a muscle or deeply under the skin. It is usually given by a health care professional in a clinic or doctor's office. However, your doctor may teach you how to inject yourself. Follow all instructions. Talk to your pediatrician regarding the use of this medicine in children. Special care may be needed. Overdosage: If you think you have taken too much of this medicine contact a poison control center or emergency room at once. NOTE: This medicine is only for you. Do not share this medicine with others. What if I miss a dose? If you are given your dose at a clinic or doctor's office, call to reschedule your appointment. If you give your own injections and you miss a dose, take it as soon as you can. If it is almost time for your next dose, take only that dose. Do not take double or extra doses. What may interact with this medicine? -colchicine -heavy alcohol intake This list may not describe all possible  interactions. Give your health care provider a list of all the medicines, herbs, non-prescription drugs, or dietary supplements you use. Also tell them if you smoke, drink alcohol, or use illegal drugs. Some items may interact with your medicine. What should I watch for while using this medicine? Visit your doctor or health care professional regularly. You may need blood work done while you are taking this medicine. You may need to follow a special diet. Talk to your doctor. Limit your alcohol intake and avoid smoking to get the best benefit. What side effects may I notice from receiving this medicine? Side effects that you should report to your doctor or health care professional as soon as possible: -allergic reactions like skin rash, itching or hives, swelling of the face, lips, or tongue -blue tint to skin -chest tightness, pain -difficulty breathing, wheezing -dizziness -red, swollen painful area on the leg Side effects that usually do not require medical attention (report to your doctor or health care professional if they continue or are bothersome): -diarrhea -headache This list may not describe all possible side effects. Call your doctor for medical advice about side effects. You may report side effects to FDA at 1-800-FDA-1088. Where should I keep my medicine? Keep out of the reach of children. Store at room temperature between 15 and 30 degrees C (59 and 85 degrees F). Protect from light. Throw away any unused medicine after the expiration date. NOTE: This sheet is a summary. It may not cover all possible information. If you have questions about this medicine, talk to your doctor, pharmacist, or   health care provider.  2018 Elsevier/Gold Standard (2007-04-03 22:10:20)  

## 2017-06-15 ENCOUNTER — Ambulatory Visit (INDEPENDENT_AMBULATORY_CARE_PROVIDER_SITE_OTHER): Payer: Medicare Other | Admitting: *Deleted

## 2017-06-15 DIAGNOSIS — E538 Deficiency of other specified B group vitamins: Secondary | ICD-10-CM

## 2017-06-15 NOTE — Progress Notes (Signed)
Pt given Cyanocobalamin inj Tolerated well 

## 2017-06-16 ENCOUNTER — Ambulatory Visit (INDEPENDENT_AMBULATORY_CARE_PROVIDER_SITE_OTHER): Payer: Medicare Other | Admitting: *Deleted

## 2017-06-16 DIAGNOSIS — E538 Deficiency of other specified B group vitamins: Secondary | ICD-10-CM | POA: Diagnosis not present

## 2017-06-16 NOTE — Progress Notes (Signed)
Pt given cyanocobalamin inj Tolerated well 

## 2017-06-17 ENCOUNTER — Ambulatory Visit (INDEPENDENT_AMBULATORY_CARE_PROVIDER_SITE_OTHER): Payer: Medicare Other | Admitting: *Deleted

## 2017-06-17 DIAGNOSIS — E538 Deficiency of other specified B group vitamins: Secondary | ICD-10-CM | POA: Diagnosis not present

## 2017-06-17 NOTE — Progress Notes (Signed)
Pt given Cyanocobalamin inj Tolerated well 

## 2017-06-19 ENCOUNTER — Other Ambulatory Visit: Payer: Self-pay | Admitting: Neurology

## 2017-06-19 ENCOUNTER — Ambulatory Visit
Admission: RE | Admit: 2017-06-19 | Discharge: 2017-06-19 | Disposition: A | Discharge status: Home / Self Care | Payer: Medicare Other | Source: Ambulatory Visit | Attending: Neurology | Admitting: Neurology

## 2017-06-19 DIAGNOSIS — Z135 Encounter for screening for eye and ear disorders: Secondary | ICD-10-CM | POA: Diagnosis not present

## 2017-06-19 DIAGNOSIS — Z9889 Other specified postprocedural states: Secondary | ICD-10-CM

## 2017-06-19 DIAGNOSIS — R400 Somnolence: Secondary | ICD-10-CM

## 2017-06-19 DIAGNOSIS — G3184 Mild cognitive impairment, so stated: Secondary | ICD-10-CM

## 2017-06-19 DIAGNOSIS — E538 Deficiency of other specified B group vitamins: Secondary | ICD-10-CM

## 2017-06-20 ENCOUNTER — Encounter: Payer: Self-pay | Admitting: Family Medicine

## 2017-06-20 ENCOUNTER — Telehealth: Payer: Self-pay | Admitting: Neurology

## 2017-06-20 NOTE — Telephone Encounter (Signed)
Spoke with pt's daughter, [name], relaying message below.  Kim states that [facility]ology has not contacted them in regards to referral for Home Sleep Study.  Will fax order to their office

## 2017-06-20 NOTE — Telephone Encounter (Signed)
Patient's daughter called and lmom needing to get results from [facility]. Please Call. Thanks

## 2017-06-20 NOTE — Telephone Encounter (Signed)
-----   Message from Cameron Sprang, MD sent at 06/20/2017 12:00 PM EDT ----- Pls let him know MRI brain did not show any evidence of tumor, stroke, or bleed. It showed age-related changes. Thanks

## 2017-06-20 NOTE — Telephone Encounter (Signed)
I sent you a Result Note about it, thanks!

## 2017-06-21 ENCOUNTER — Telehealth: Payer: Self-pay | Admitting: *Deleted

## 2017-06-21 ENCOUNTER — Other Ambulatory Visit: Payer: Self-pay | Admitting: *Deleted

## 2017-06-21 NOTE — Telephone Encounter (Signed)
Patient in office today requesting B12 injection per orders patient is not due for B12 injection until 7/14

## 2017-06-24 ENCOUNTER — Ambulatory Visit: Payer: Medicare Other

## 2017-06-24 DIAGNOSIS — E538 Deficiency of other specified B group vitamins: Secondary | ICD-10-CM

## 2017-06-24 NOTE — Progress Notes (Signed)
Vitamin b12 injection given and tolerated well.  

## 2017-07-01 ENCOUNTER — Ambulatory Visit (INDEPENDENT_AMBULATORY_CARE_PROVIDER_SITE_OTHER): Payer: Medicare Other | Admitting: *Deleted

## 2017-07-01 DIAGNOSIS — E538 Deficiency of other specified B group vitamins: Secondary | ICD-10-CM | POA: Diagnosis not present

## 2017-07-01 NOTE — Progress Notes (Signed)
Pt given Cyanocobalamin inj Tolerated well 

## 2017-07-08 ENCOUNTER — Ambulatory Visit (INDEPENDENT_AMBULATORY_CARE_PROVIDER_SITE_OTHER): Payer: Medicare Other | Admitting: *Deleted

## 2017-07-08 DIAGNOSIS — E538 Deficiency of other specified B group vitamins: Secondary | ICD-10-CM

## 2017-07-11 ENCOUNTER — Ambulatory Visit: Payer: Medicare Other

## 2017-07-13 DIAGNOSIS — G4733 Obstructive sleep apnea (adult) (pediatric): Secondary | ICD-10-CM | POA: Diagnosis not present

## 2017-07-14 ENCOUNTER — Other Ambulatory Visit: Payer: Self-pay | Admitting: *Deleted

## 2017-07-14 DIAGNOSIS — G3184 Mild cognitive impairment, so stated: Secondary | ICD-10-CM

## 2017-07-14 DIAGNOSIS — E538 Deficiency of other specified B group vitamins: Secondary | ICD-10-CM

## 2017-07-14 DIAGNOSIS — R400 Somnolence: Secondary | ICD-10-CM

## 2017-07-15 ENCOUNTER — Ambulatory Visit (INDEPENDENT_AMBULATORY_CARE_PROVIDER_SITE_OTHER): Payer: Medicare Other | Admitting: *Deleted

## 2017-07-15 DIAGNOSIS — E538 Deficiency of other specified B group vitamins: Secondary | ICD-10-CM | POA: Diagnosis not present

## 2017-07-15 NOTE — Progress Notes (Signed)
Pt given Cyanocobalamin inj Tolerated well 

## 2017-07-18 ENCOUNTER — Ambulatory Visit: Payer: Medicare Other

## 2017-07-19 DIAGNOSIS — G4733 Obstructive sleep apnea (adult) (pediatric): Secondary | ICD-10-CM | POA: Diagnosis not present

## 2017-07-22 ENCOUNTER — Ambulatory Visit (INDEPENDENT_AMBULATORY_CARE_PROVIDER_SITE_OTHER): Payer: Medicare Other | Admitting: *Deleted

## 2017-07-22 DIAGNOSIS — E538 Deficiency of other specified B group vitamins: Secondary | ICD-10-CM | POA: Diagnosis not present

## 2017-07-22 NOTE — Progress Notes (Signed)
Pt given Cyancobalamin inj Tolerated well 

## 2017-07-26 ENCOUNTER — Telehealth: Payer: Self-pay

## 2017-07-26 DIAGNOSIS — R400 Somnolence: Secondary | ICD-10-CM

## 2017-07-26 NOTE — Telephone Encounter (Signed)
Pls send referral to Sleep Medicine and let daughter know referral has been sent for eval. Thanks

## 2017-07-26 NOTE — Telephone Encounter (Signed)
-----   Message from Cameron Sprang, MD sent at 07/26/2017  1:52 PM EDT ----- Regarding: sleep study results Pls let wife or daughter know that the sleep study had shown sleep apnea, recommendation is to start CPAP. If they would are comfortable with starting CPAP before seeing the sleep specialist, we can order this for him then he sees the sleep specialist, but if they would like to discuss in more detail first, we can send referral to Pulmonary Sleep Medicine for eval and treatment of sleep apnea. Thanks  Santiago Glad  ----- Message ----- From: Chesley Mires, MD Sent: 07/19/2017   4:19 PM To: Cameron Sprang, MD  Either way is fine.  If he is comfortable with starting CPAP first, then that might be best.  If he would like to discuss in more detail first before starting CPAP, then can arrange for referral first.  Vineet  ----- Message ----- From: Cameron Sprang, MD Sent: 07/19/2017   3:53 PM To: Chesley Mires, MD  Hi Vineet,  Thanks for the update. Should I just go ahead and order CPAP for him then send referral to your group, or hold off on CPAP and refer first?  Thanks! Santiago Glad  ----- Message ----- From: Chesley Mires, MD Sent: 07/19/2017   8:05 AM To: Cameron Sprang, MD  Santiago Glad,  Reviewed home sleep study for Mr. Debold from 07/13/17.  He has severe obstructive sleep apnea with an AHI of 31.1 and SaO2 low of 80%.  His study should be scanned into Epic directly.  Please let me know if I could be of further assistance.  Thanks.  Vineet

## 2017-07-26 NOTE — Telephone Encounter (Signed)
Referral placed.  Patient daughter aware.

## 2017-07-26 NOTE — Telephone Encounter (Signed)
Spoke with pateints daughter.  She is concerned the sleep study was inaccurate as the patient thinks some of the leads came off during the study.  They would like to proceed with other providers before starting the CPAP.

## 2017-08-11 ENCOUNTER — Other Ambulatory Visit: Payer: Self-pay | Admitting: Family Medicine

## 2017-08-18 ENCOUNTER — Ambulatory Visit (INDEPENDENT_AMBULATORY_CARE_PROVIDER_SITE_OTHER): Payer: Medicare Other | Admitting: Family Medicine

## 2017-08-18 ENCOUNTER — Encounter: Payer: Self-pay | Admitting: Family Medicine

## 2017-08-18 VITALS — BP 155/82 | HR 72 | Temp 98.8°F | Ht 72.0 in | Wt 192.0 lb

## 2017-08-18 DIAGNOSIS — E538 Deficiency of other specified B group vitamins: Secondary | ICD-10-CM | POA: Diagnosis not present

## 2017-08-18 DIAGNOSIS — E039 Hypothyroidism, unspecified: Secondary | ICD-10-CM | POA: Diagnosis not present

## 2017-08-18 DIAGNOSIS — R7303 Prediabetes: Secondary | ICD-10-CM

## 2017-08-18 DIAGNOSIS — K5900 Constipation, unspecified: Secondary | ICD-10-CM | POA: Diagnosis not present

## 2017-08-18 DIAGNOSIS — E663 Overweight: Secondary | ICD-10-CM

## 2017-08-18 DIAGNOSIS — E782 Mixed hyperlipidemia: Secondary | ICD-10-CM | POA: Diagnosis not present

## 2017-08-18 DIAGNOSIS — I10 Essential (primary) hypertension: Secondary | ICD-10-CM

## 2017-08-18 MED ORDER — AMLODIPINE BESYLATE 5 MG PO TABS: 7.5000 mg | ORAL_TABLET | Freq: Every day | ORAL | 3 refills | Status: DC

## 2017-08-18 MED ORDER — CYANOCOBALAMIN 1000 MCG/ML IJ SOLN
1000.0000 ug | INTRAMUSCULAR | Status: DC
  Administered 2017-11-01 – 2017-11-29 (×3): 1000 ug via INTRAMUSCULAR

## 2017-08-18 MED ORDER — LOSARTAN POTASSIUM 100 MG PO TABS: 100.0000 mg | ORAL_TABLET | Freq: Every day | ORAL | 3 refills | Status: DC

## 2017-08-18 NOTE — Progress Notes (Signed)
BP (!) 155/82   Pulse 72   Temp 98.8 F (37.1 C) (Oral)   Ht 6' (1.829 m)   Wt 192 lb (87.1 kg)   BMI 26.04 kg/m    Subjective:    Patient ID: James Thomas, male    DOB: [birth year], 83 y.o.   MRN: [id number]  HPI: SAHITH NURSE is a 83 y.o. male presenting on 08/18/2017 for Hypertension (6 month follow up) and Hyperlipidemia   HPI Hypertension Patient is currently on amlodipine and losartan, and their blood pressure today is 155/82, he says his home blood pressures have been lower and more normal than that in the 130s. Patient denies any lightheadedness or dizziness. Patient denies headaches, blurred vision, chest pains, shortness of breath, or weakness. Denies any side effects from medication and is content with current medication.   Hypothyroidism recheck Patient is coming in for thyroid recheck today as well. They deny any issues with hair changes or heat or cold problems or diarrhea or constipation. They deny any chest pain or palpitations. They are currently on levothyroxine 75 micrograms   Prediabetes Patient comes in today for recheck of his diabetes. Patient has been currently taking no medication we are monitoring but he has been doing well with it and will recheck today. Patient is currently on an ACE inhibitor/ARB. Patient has not seen an ophthalmologist this year. Patient denies any issues with their feet.   Hyperlipidemia Patient is coming in for recheck of his hyperlipidemia. The patient is currently taking fish oil, patient has been intolerant to fenofibrate and statins. They deny any issues with myalgias or history of liver damage from it. They deny any focal numbness or weakness or chest pain.   Relevant past medical, surgical, family and social history reviewed and updated as indicated. Interim medical history since our last visit reviewed. Allergies and medications reviewed and updated.  Review of Systems  Constitutional: Negative for chills and fever.    Respiratory: Negative for shortness of breath and wheezing.   Cardiovascular: Negative for chest pain and leg swelling.  Endocrine: Negative for cold intolerance and heat intolerance.  Musculoskeletal: Negative for back pain and gait problem.  Skin: Negative for rash.  Neurological: Negative for dizziness, weakness, light-headedness, numbness and headaches.  All other systems reviewed and are negative.   Per HPI unless specifically indicated above   Allergies as of 08/18/2017      Reactions   Fenofibrate Cough   Meloxicam    Unsure but thinks it gave him a rash   Niacin And Related Itching, Rash      Medication List        Accurate as of 08/18/17  9:09 AM. Always use your most recent med list.          amLODipine 5 MG tablet Commonly known as:  NORVASC Take 1.5 tablets (7.5 mg total) by mouth daily.   calcium carbonate 1250 MG capsule Take 1,250 mg by mouth 2 (two) times daily with a meal.   CENTRUM SILVER 50+MEN PO Take 1 tablet by mouth daily.   cholecalciferol 1000 units tablet Commonly known as:  VITAMIN D Take 2,000 Units by mouth daily.   Fish Oil 1200 MG Caps Take 2 capsules by mouth daily.   levothyroxine 75 MCG tablet Commonly known as:  SYNTHROID, LEVOTHROID TAKE 1 TABLET BY MOUTH ONCE DAILY   losartan 100 MG tablet Commonly known as:  COZAAR Take 1 tablet (100 mg total) by mouth daily.  Objective:    BP (!) 155/82   Pulse 72   Temp 98.8 F (37.1 C) (Oral)   Ht 6' (1.829 m)   Wt 192 lb (87.1 kg)   BMI 26.04 kg/m   Wt Readings from Last 3 Encounters:  08/18/17 192 lb (87.1 kg)  05/27/17 193 lb (87.5 kg)  05/19/17 195 lb (88.5 kg)    Physical Exam  Constitutional: He is oriented to person, place, and time. He appears well-developed and well-nourished. No distress.  Eyes: Conjunctivae are normal. No scleral icterus.  Neck: Neck supple. No thyromegaly present.  Cardiovascular: Normal rate, regular rhythm, normal heart sounds  and intact distal pulses.  No murmur heard. Pulmonary/Chest: Effort normal and breath sounds normal. No respiratory distress. He has no wheezes.  Musculoskeletal: Normal range of motion. He exhibits no edema.  Lymphadenopathy:    He has no cervical adenopathy.  Neurological: He is alert and oriented to person, place, and time. Coordination normal.  Skin: Skin is warm and dry. No rash noted. He is not diaphoretic.  Psychiatric: He has a normal mood and affect. His behavior is normal.  Nursing note and vitals reviewed.       Assessment & Plan:   Problem List Items Addressed This Visit      Cardiovascular and Mediastinum   HTN (hypertension)   Relevant Medications   amLODipine (NORVASC) 5 MG tablet   losartan (COZAAR) 100 MG tablet     Endocrine   Hypothyroid     Other   HLD (hyperlipidemia)   Relevant Medications   amLODipine (NORVASC) 5 MG tablet   losartan (COZAAR) 100 MG tablet   Prediabetes - Primary   Overweight (BMI 25.0-29.9)    Other Visit Diagnoses    Constipation, unspecified constipation type       Relevant Orders   Ambulatory referral to Gastroenterology   Vitamin B12 deficiency       Relevant Medications   cyanocobalamin ((VITAMIN B-12)) injection 1,000 mcg       Follow up plan: Return in about 4 weeks (around 09/15/2017), or if symptoms worsen or fail to improve, for Hypertension.  Counseling provided for all of the vaccine components No orders of the defined types were placed in this encounter.   Caryl Pina, MD [facility] 08/18/2017, 9:09 AM

## 2017-08-19 ENCOUNTER — Encounter: Payer: Self-pay | Admitting: Gastroenterology

## 2017-09-06 ENCOUNTER — Encounter: Payer: Self-pay | Admitting: Family Medicine

## 2017-09-18 ENCOUNTER — Encounter: Payer: Self-pay | Admitting: Family Medicine

## 2017-09-19 ENCOUNTER — Encounter: Payer: Self-pay | Admitting: Family Medicine

## 2017-09-19 ENCOUNTER — Telehealth: Payer: Self-pay | Admitting: Family Medicine

## 2017-09-19 ENCOUNTER — Ambulatory Visit (INDEPENDENT_AMBULATORY_CARE_PROVIDER_SITE_OTHER): Payer: Medicare Other | Admitting: Family Medicine

## 2017-09-19 VITALS — BP 150/79 | HR 76 | Temp 97.1°F | Ht 72.0 in | Wt 190.0 lb

## 2017-09-19 DIAGNOSIS — I10 Essential (primary) hypertension: Secondary | ICD-10-CM

## 2017-09-19 DIAGNOSIS — G4733 Obstructive sleep apnea (adult) (pediatric): Secondary | ICD-10-CM | POA: Insufficient documentation

## 2017-09-19 DIAGNOSIS — E538 Deficiency of other specified B group vitamins: Secondary | ICD-10-CM | POA: Diagnosis not present

## 2017-09-19 DIAGNOSIS — G4730 Sleep apnea, unspecified: Secondary | ICD-10-CM | POA: Diagnosis not present

## 2017-09-19 MED ORDER — AMLODIPINE BESYLATE 5 MG PO TABS: 7.5000 mg | ORAL_TABLET | Freq: Every day | ORAL | 3 refills | Status: DC

## 2017-09-19 NOTE — Progress Notes (Signed)
BP (!) 150/79   Pulse 76   Temp (!) 97.1 F (36.2 C) (Oral)   Ht 6' (1.829 m)   Wt 190 lb (86.2 kg)   BMI 25.77 kg/m    Subjective:    Patient ID: James Thomas, male    DOB: 01-Sep-1934, 83 y.o.   MRN: [id number]  HPI: James Thomas is a 83 y.o. male presenting on 09/19/2017 for Hypertension (4 week follow up)   HPI Hypertension Patient is currently on amlodipine and losartan, and their blood pressure today is 150/79. Patient denies any lightheadedness or dizziness. Patient denies headaches, blurred vision, chest pains, shortness of breath, or weakness. Denies any side effects from medication and is content with current medication.   Sleep apnea Patient had a sleep study which showed that he had 108 sleep apnea events and he has been having a lot of difficulties with fatigue and lack of energy and just needing to sleep through the day.  Patient sleeps more than 10 hours per night but still feels like he has to sleep during the day.  His wife does admit that he snores and does admit that sometimes she sits and waits for him to start breathing again  Relevant past medical, surgical, family and social history reviewed and updated as indicated. Interim medical history since our last visit reviewed. Allergies and medications reviewed and updated.  Review of Systems  Constitutional: Positive for fatigue. Negative for chills and fever.  Eyes: Negative for visual disturbance.  Respiratory: Negative for shortness of breath and wheezing.   Cardiovascular: Negative for chest pain and leg swelling.  Musculoskeletal: Negative for back pain and gait problem.  Skin: Negative for rash.  Neurological: Negative for dizziness, weakness and light-headedness.  All other systems reviewed and are negative.   Per HPI unless specifically indicated above   Allergies as of 09/19/2017      Reactions   Fenofibrate Cough   Meloxicam    Unsure but thinks it gave him a rash   Niacin And Related  Itching, Rash      Medication List        Accurate as of 09/19/17 10:35 AM. Always use your most recent med list.          amLODipine 5 MG tablet Commonly known as:  NORVASC Take 1.5 tablets (7.5 mg total) by mouth daily.   calcium carbonate 1250 MG capsule Take 1,250 mg by mouth 2 (two) times daily with a meal.   CENTRUM SILVER 50+MEN PO Take 1 tablet by mouth daily.   cholecalciferol 1000 units tablet Commonly known as:  VITAMIN D Take 2,000 Units by mouth daily.   Fish Oil 1200 MG Caps Take 2 capsules by mouth daily.   levothyroxine 75 MCG tablet Commonly known as:  SYNTHROID, LEVOTHROID TAKE 1 TABLET BY MOUTH ONCE DAILY   losartan 100 MG tablet Commonly known as:  COZAAR Take 1 tablet (100 mg total) by mouth daily.          Objective:    BP (!) 150/79   Pulse 76   Temp (!) 97.1 F (36.2 C) (Oral)   Ht 6' (1.829 m)   Wt 190 lb (86.2 kg)   BMI 25.77 kg/m   Wt Readings from Last 3 Encounters:  09/19/17 190 lb (86.2 kg)  08/18/17 192 lb (87.1 kg)  05/27/17 193 lb (87.5 kg)    Physical Exam  Constitutional: He is oriented to person, place, and time. He appears well-developed and  well-nourished. No distress.  Eyes: Conjunctivae are normal. No scleral icterus.  Neck: Neck supple. No thyromegaly present.  Cardiovascular: Normal rate, regular rhythm, normal heart sounds and intact distal pulses.  No murmur heard. Pulmonary/Chest: Effort normal and breath sounds normal. No respiratory distress. He has no wheezes.  Musculoskeletal: Normal range of motion. He exhibits no edema.  Lymphadenopathy:    He has no cervical adenopathy.  Neurological: He is alert and oriented to person, place, and time. Coordination normal.  Skin: Skin is warm and dry. No rash noted. He is not diaphoretic.  Psychiatric: He has a normal mood and affect. His behavior is normal.  Nursing note and vitals reviewed.   Results for orders placed or performed in visit on 05/19/17   CMP14+EGFR  Result Value Ref Range   Glucose 117 (H) 65 - 99 mg/dL   BUN 5 (L) 8 - 27 mg/dL   Creatinine, Ser 1.06 0.76 - 1.27 mg/dL   GFR calc non Af Amer 66 >59 mL/min/1.73   GFR calc Af Amer 76 >59 mL/min/1.73   BUN/Creatinine Ratio 5 (L) 10 - 24   Sodium 141 134 - 144 mmol/L   Potassium 4.0 3.5 - 5.2 mmol/L   Chloride 102 96 - 106 mmol/L   CO2 26 20 - 29 mmol/L   Calcium 9.8 8.6 - 10.2 mg/dL   Total Protein 6.9 6.0 - 8.5 g/dL   Albumin 4.3 3.5 - 4.7 g/dL   Globulin, Total 2.6 1.5 - 4.5 g/dL   Albumin/Globulin Ratio 1.7 1.2 - 2.2   Bilirubin Total 1.1 0.0 - 1.2 mg/dL   Alkaline Phosphatase 101 39 - 117 IU/L   AST 22 0 - 40 IU/L   ALT 16 0 - 44 IU/L  TSH  Result Value Ref Range   TSH 2.190 0.450 - 4.500 uIU/mL  Bayer DCA Hb A1c Waived  Result Value Ref Range   HB A1C (BAYER DCA - WAIVED) 5.8 <7.0 %  Vitamin B12  Result Value Ref Range   Vitamin B-12 234 232 - 1,245 pg/mL      Assessment & Plan:   Problem List Items Addressed This Visit      Cardiovascular and Mediastinum   HTN (hypertension) - Primary   Relevant Medications   amLODipine (NORVASC) 5 MG tablet     Respiratory   Severe sleep apnea      Patient has a neurologist and Dr. Delice Lesch and they will call to see if that doctor does sleep medicine, if not they will call back for referral.  Continue amlodipine at 1-1/2 tablets and losartan, after he gets treatment for sleep apnea we may need to come back off of the medication.  For now his blood pressures running good at home in the 120s to 140s over 70s Follow up plan: Return in about 6 months (around 03/20/2018), or if symptoms worsen or fail to improve, for Hypertension and thyroid recheck.  Counseling provided for all of the vaccine components No orders of the defined types were placed in this encounter.   Caryl Pina, MD [facility] 09/19/2017, 10:35 AM

## 2017-09-19 NOTE — Telephone Encounter (Signed)
Yes have him continue his monthly B12 injections

## 2017-09-19 NOTE — Telephone Encounter (Signed)
Pt aware.

## 2017-09-28 ENCOUNTER — Ambulatory Visit (INDEPENDENT_AMBULATORY_CARE_PROVIDER_SITE_OTHER): Payer: Medicare Other

## 2017-09-28 DIAGNOSIS — Z23 Encounter for immunization: Secondary | ICD-10-CM

## 2017-10-04 DIAGNOSIS — Z85828 Personal history of other malignant neoplasm of skin: Secondary | ICD-10-CM | POA: Diagnosis not present

## 2017-10-04 DIAGNOSIS — L570 Actinic keratosis: Secondary | ICD-10-CM | POA: Diagnosis not present

## 2017-10-04 DIAGNOSIS — L821 Other seborrheic keratosis: Secondary | ICD-10-CM | POA: Diagnosis not present

## 2017-10-19 ENCOUNTER — Ambulatory Visit (INDEPENDENT_AMBULATORY_CARE_PROVIDER_SITE_OTHER): Payer: Medicare Other | Admitting: *Deleted

## 2017-10-19 DIAGNOSIS — E538 Deficiency of other specified B group vitamins: Secondary | ICD-10-CM | POA: Diagnosis not present

## 2017-10-19 NOTE — Progress Notes (Signed)
Pt given Cyanocobalamin inj Tolerated well 

## 2017-10-21 ENCOUNTER — Encounter: Payer: Self-pay | Admitting: Family Medicine

## 2017-10-21 NOTE — Telephone Encounter (Signed)
Patients daughter states that he seemed to be doing fine when getting weekly shots. Since going to monthly he is sleeping more and the memory issues are getting worse. He does good for a little over a week and then he starts back with the memory and sleeping. Please advise

## 2017-10-31 ENCOUNTER — Telehealth: Payer: Self-pay | Admitting: *Deleted

## 2017-10-31 NOTE — Telephone Encounter (Signed)
Pt notified B12 injs can be scheduled every 2 weeks Pt will come in this week for a B12 inj

## 2017-11-01 ENCOUNTER — Telehealth: Payer: Self-pay | Admitting: *Deleted

## 2017-11-01 ENCOUNTER — Ambulatory Visit (INDEPENDENT_AMBULATORY_CARE_PROVIDER_SITE_OTHER): Payer: Medicare Other | Admitting: *Deleted

## 2017-11-01 DIAGNOSIS — B351 Tinea unguium: Secondary | ICD-10-CM | POA: Diagnosis not present

## 2017-11-01 DIAGNOSIS — M79676 Pain in unspecified toe(s): Secondary | ICD-10-CM | POA: Diagnosis not present

## 2017-11-01 DIAGNOSIS — E538 Deficiency of other specified B group vitamins: Secondary | ICD-10-CM

## 2017-11-01 NOTE — Progress Notes (Signed)
Pt given Cyanocobalamin inj Tolerated well 

## 2017-11-01 NOTE — Telephone Encounter (Signed)
Pt's daughter called and informed Dr Dettinger stated pt can get B12 injs every 2 weeks Verbalizes understanding

## 2017-11-15 ENCOUNTER — Ambulatory Visit (INDEPENDENT_AMBULATORY_CARE_PROVIDER_SITE_OTHER): Payer: Medicare Other | Admitting: *Deleted

## 2017-11-15 DIAGNOSIS — E538 Deficiency of other specified B group vitamins: Secondary | ICD-10-CM

## 2017-11-15 NOTE — Progress Notes (Signed)
Pt given Cyanocobalamin inj tolerated well 

## 2017-11-24 ENCOUNTER — Ambulatory Visit (INDEPENDENT_AMBULATORY_CARE_PROVIDER_SITE_OTHER): Payer: Medicare Other | Admitting: Gastroenterology

## 2017-11-24 ENCOUNTER — Encounter: Payer: Self-pay | Admitting: Gastroenterology

## 2017-11-24 ENCOUNTER — Telehealth: Payer: Self-pay

## 2017-11-24 DIAGNOSIS — K5900 Constipation, unspecified: Secondary | ICD-10-CM | POA: Insufficient documentation

## 2017-11-24 DIAGNOSIS — R198 Other specified symptoms and signs involving the digestive system and abdomen: Secondary | ICD-10-CM | POA: Diagnosis not present

## 2017-11-24 NOTE — Patient Instructions (Signed)
Please collect stool specimen to check for blood in stool.  Call and let me know if he is taking docusate sodium (stool softener) or bisacodyl. Consider taking only Miralax at bedtime.   Call and speak to my nurse once you decide if you want to pursue colonoscopy.

## 2017-11-24 NOTE — Telephone Encounter (Addendum)
Pt's wife called office to let [facility] know he is taking Dulcolax stool softener with docusate sodium 100mg . Active ingredient is docusate sodium 100mg .

## 2017-11-24 NOTE — Progress Notes (Signed)
Primary Care Physician:  Dettinger, Fransisca Kaufmann, MD  Primary Gastroenterologist:  Barney Drain, MD   Chief Complaint  Patient presents with  . Constipation    doing better    HPI:  James Thomas is a 83 y.o. male here at the request of Dr. Warrick Parisian for further evaluation of constipation.  Patient presents with his wife and daughter today.  Complains of one year history of change in bowel habits.  Typically would have regular bowel movement each day but over the past 1 year he has had increasing issues with constipation.  He is taking either bisacodyl or stool softener in the morning and MiraLAX at bedtime.  Finally has gotten his bowels moving on a good regimen, typically [place] 3-4 daily.  He denies bright red blood per rectum or melena.  He denies abdominal pain.  States his appetite is adequate.  Notes he used to eat more when he was still working, fully retired about 2 years ago.  Maintains his weight.  Denies heartburn, dysphagia, vomiting.  No prior colonoscopy.  No family history of colon cancer.  He complains of fatigue.  Has been seeing a neurologist because of some mild cognitive issues.  B12 level found to be borderline low.  He has been on B12 injections with improvement of some of his cognitive issues.  Follows with neurology.  In the process of completing evaluation for sleep apnea.   Current Outpatient Medications  Medication Sig Dispense Refill  . amLODipine (NORVASC) 5 MG tablet Take 1.5 tablets (7.5 mg total) by mouth daily. 135 tablet 3  . Calcium Citrate-Vitamin D (CITRACAL + D PO) Take by mouth daily.    . cholecalciferol (VITAMIN D) 1000 UNITS tablet Take 2,000 Units by mouth daily.     Marland Kitchen docusate sodium (COLACE) 100 MG capsule Take 100 mg by mouth daily.    Marland Kitchen levothyroxine (SYNTHROID, LEVOTHROID) 75 MCG tablet TAKE 1 TABLET BY MOUTH ONCE DAILY 90 tablet 2  . losartan (COZAAR) 100 MG tablet Take 1 tablet (100 mg total) by mouth daily. 90 tablet 3  . Multiple  Vitamins-Minerals (CENTRUM SILVER 50+MEN PO) Take 1 tablet by mouth daily.    . Omega-3 Fatty Acids (FISH OIL) 1200 MG CAPS Take 2 capsules by mouth daily.    . polyethylene glycol (MIRALAX / GLYCOLAX) packet Take 17 g by mouth at bedtime.    Marland Kitchen PRESCRIPTION MEDICATION Triam 0.1% CR/Generic Eucerin clay-pack (compounded by Dr. Modena Nunnery)     Current Facility-Administered Medications  Medication Dose Route Frequency Provider Last Rate Last Dose  . cyanocobalamin ((VITAMIN B-12)) injection 1,000 mcg  1,000 mcg Intramuscular Q30 days Dettinger, Fransisca Kaufmann, MD   1,000 mcg at 10/19/17 0825  . cyanocobalamin ((VITAMIN B-12)) injection 1,000 mcg  1,000 mcg Intramuscular Q30 days Dettinger, Fransisca Kaufmann, MD   1,000 mcg at 11/15/17 0903    Allergies as of 11/24/2017 - Review Complete 11/24/2017  Allergen Reaction Noted  . Fenofibrate Cough 10/22/2015  . Meloxicam  12/21/2012  . Niacin and related Itching and Rash 01/21/2011    Past Medical History:  Diagnosis Date  . Arthritis   . B12 deficiency    On injections  . Hypercholesteremia   . Hypertension   . Hypothyroidism   . Osteoporosis     Past Surgical History:  Procedure Laterality Date  . BACK SURGERY  1979;1985   slipped disc;ruptured disc  . CATARACT EXTRACTION W/PHACO  02/01/2011   Procedure: CATARACT EXTRACTION PHACO AND INTRAOCULAR LENS PLACEMENT (IOC);  Surgeon: Tonny Branch, MD;  Location: [facility];  Service: Ophthalmology;  Laterality: Right;  CDE:18.53    Family History  Problem Relation Age of Onset  . Diabetes Father   . Heart attack Maternal Grandmother   . Heart attack Brother 35  . Anesthesia problems Neg Hx   . Hypotension Neg Hx   . Malignant hyperthermia Neg Hx   . Pseudochol deficiency Neg Hx   . Colon cancer Neg Hx     Social History   Socioeconomic History  . Marital status: Married    Spouse name: Not on file  . Number of children: 1  . Years of education: 14 years  . Highest education level: Associate degree:  occupational, Hotel manager, or vocational program  Occupational History  . Occupation: Retired    Comment: Engineer, building services, bodywork  Social Needs  . Financial resource strain: Not hard at all  . Food insecurity:    Worry: Never true    Inability: Never true  . Transportation needs:    Medical: No    Non-medical: No  Tobacco Use  . Smoking status: Former Smoker    Packs/day: 1.00    Years: 10.00    Pack years: 10.00    Types: Cigarettes    Last attempt to quit: 01/27/1972    Years since quitting: 45.8  . Smokeless tobacco: Never Used  Substance and Sexual Activity  . Alcohol use: No  . Drug use: No  . Sexual activity: Never  Lifestyle  . Physical activity:    Days per week: 0 days    Minutes per session: 0 min  . Stress: Not at all  Relationships  . Social connections:    Talks on phone: More than three times a week    Gets together: More than three times a week    Attends religious service: More than 4 times per year    Active member of club or organization: Yes    Attends meetings of clubs or organizations: More than 4 times per year    Relationship status: Married  . Intimate partner violence:    Fear of current or ex partner: No    Emotionally abused: No    Physically abused: No    Forced sexual activity: No  Other Topics Concern  . 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 He is married and lives in a one story home with a basement with his wife. They have one adult daughter and two granddaughters.       ROS:  General: Negative for anorexia, weight loss, fever, chills, weakness. +fatigue Eyes: Negative for vision changes.  ENT: Negative for hoarseness, difficulty swallowing , nasal congestion. CV: Negative for chest pain, angina, palpitations, dyspnea on exertion, peripheral edema.  Respiratory: Negative for dyspnea at rest, dyspnea on exertion, cough, sputum, wheezing.  GI: See  history of present illness. GU:  Negative for dysuria, hematuria, urinary incontinence, urinary frequency, nocturnal urination.  MS: Negative for joint pain, low back pain.  Derm: Negative for rash or itching.  Neuro: Negative for weakness, abnormal sensation, seizure, frequent headaches,   confusion. +memory loss Psych: Negative for anxiety, depression, suicidal ideation, hallucinations.  Endo: Negative for unusual weight change.  Heme: Negative for bruising or bleeding. Allergy: Negative for rash or hives.    Physical Examination:  BP 127/80   Pulse 75   Temp 97.9 F (36.6 C) (Oral)  Ht 6' (1.829 m)   Wt 187 lb 6.4 oz (85 kg)   BMI 25.42 kg/m    General: Well-nourished, well-developed in no acute distress.  Head: Normocephalic, atraumatic.   Eyes: Conjunctiva pink, no icterus. Mouth: Oropharyngeal mucosa moist and pink , no lesions erythema or exudate. Neck: Supple without thyromegaly, masses, or lymphadenopathy.  Lungs: Clear to auscultation bilaterally.  Heart: Regular rate and rhythm, no murmurs rubs or gallops.  Abdomen: Bowel sounds are normal, nontender, nondistended, no hepatosplenomegaly or masses, no abdominal bruits or    hernia , no rebound or guarding.   Rectal: not performed Extremities: No lower extremity edema. No clubbing or deformities.  Neuro: Alert and oriented x 4 , grossly normal neurologically.  Skin: Warm and dry, no rash or jaundice.   Psych: Alert and cooperative, normal mood and affect.  Labs: Lab Results  Component Value Date   CREATININE 1.06 05/19/2017   BUN 5 (L) 05/19/2017   NA 141 05/19/2017   K 4.0 05/19/2017   CL 102 05/19/2017   CO2 26 05/19/2017   Lab Results  Component Value Date   ALT 16 05/19/2017   AST 22 05/19/2017   ALKPHOS 101 05/19/2017   BILITOT 1.1 05/19/2017   Lab Results  Component Value Date   WBC 6.7 02/07/2017   HGB 15.0 02/07/2017   HCT 42.8 02/07/2017   MCV 89 02/07/2017   PLT 315 02/07/2017       Lab Results  Component Value Date   VITAMINB12 234 05/19/2017     Imaging Studies: No results found.

## 2017-11-25 ENCOUNTER — Encounter: Payer: Self-pay | Admitting: Gastroenterology

## 2017-11-25 ENCOUNTER — Ambulatory Visit (INDEPENDENT_AMBULATORY_CARE_PROVIDER_SITE_OTHER): Payer: Medicare Other | Admitting: Gastroenterology

## 2017-11-25 DIAGNOSIS — R198 Other specified symptoms and signs involving the digestive system and abdomen: Secondary | ICD-10-CM | POA: Diagnosis not present

## 2017-11-25 LAB — IFOBT (OCCULT BLOOD): IFOBT: NEGATIVE

## 2017-11-25 NOTE — Assessment & Plan Note (Signed)
83 year old gentleman presenting for further evaluation of 1 year history of change in bowel habits.  Has been dealing with constipation.  Symptoms are well managed at this point on his current bowel regimen.  At this point patient is feeling well from a GI standpoint and not sure if he wants to pursue any further work-up.  He and his family both report that he was encouraged to have a colonoscopy about a year ago and he never had it done and wonders if you should have one now.  We discussed possibility of diagnostic colonoscopy for change in bowel habits.  We also discussed that there would be no place for "screening colonoscopy" at his age based on guidelines.  At this point he will complete I FOBT as mutually agreed upon.  He would like to hold off on scheduling a diagnostic colonoscopy until they have completed some upcoming appointments.  He will continue MiraLAX at bedtime, stop stool softener.  They will call if there is any worsening constipation.

## 2017-11-28 NOTE — Progress Notes (Signed)
CC'D TO PCP °

## 2017-11-28 NOTE — Telephone Encounter (Signed)
Noted. Med list updated.

## 2017-11-29 ENCOUNTER — Ambulatory Visit (INDEPENDENT_AMBULATORY_CARE_PROVIDER_SITE_OTHER): Payer: Medicare Other | Admitting: *Deleted

## 2017-11-29 DIAGNOSIS — E538 Deficiency of other specified B group vitamins: Secondary | ICD-10-CM

## 2017-11-29 MED ORDER — CYANOCOBALAMIN 1000 MCG/ML IJ SOLN
1000.0000 ug | INTRAMUSCULAR | Status: AC
  Administered 2017-12-13 – 2018-03-20 (×8): 1000 ug via INTRAMUSCULAR

## 2017-11-29 NOTE — Progress Notes (Signed)
Pt given Cyancobalamin inj Tolerated well 

## 2017-12-07 ENCOUNTER — Other Ambulatory Visit: Payer: Self-pay | Admitting: Family Medicine

## 2017-12-12 NOTE — Progress Notes (Signed)
I informed his daughter, [name], and she will discuss with pt and let us know if he has changed his mind.

## 2017-12-13 ENCOUNTER — Ambulatory Visit (INDEPENDENT_AMBULATORY_CARE_PROVIDER_SITE_OTHER): Payer: Medicare Other | Admitting: *Deleted

## 2017-12-13 DIAGNOSIS — E538 Deficiency of other specified B group vitamins: Secondary | ICD-10-CM

## 2017-12-13 NOTE — Progress Notes (Signed)
Pt given cyanocobalamin inj Tolerated well 

## 2017-12-23 ENCOUNTER — Encounter: Payer: Self-pay | Admitting: Neurology

## 2017-12-23 ENCOUNTER — Other Ambulatory Visit: Payer: Self-pay

## 2017-12-23 ENCOUNTER — Ambulatory Visit (INDEPENDENT_AMBULATORY_CARE_PROVIDER_SITE_OTHER): Payer: Medicare Other | Admitting: Neurology

## 2017-12-23 VITALS — BP 128/62 | HR 77 | Ht 72.0 in | Wt 186.0 lb

## 2017-12-23 DIAGNOSIS — G4733 Obstructive sleep apnea (adult) (pediatric): Secondary | ICD-10-CM

## 2017-12-23 DIAGNOSIS — G3184 Mild cognitive impairment, so stated: Secondary | ICD-10-CM

## 2017-12-23 NOTE — Patient Instructions (Signed)
1. We will order CPAP titration 2. Refer to Sleep Medicine for severe sleep apnea 3. Continue with B12 supplement 4. Continue to monitor driving 5. Follow-up in 6 months, call for any changes  FALL PRECAUTIONS: Be cautious when walking. Scan the area for obstacles that may increase the risk of trips and falls. When getting up in the mornings, sit up at the edge of the bed for a few minutes before getting out of bed. Consider elevating the bed at the head end to avoid drop of blood pressure when getting up. Walk always in a well-lit room (use night lights in the walls). Avoid area rugs or power cords from appliances in the middle of the walkways. Use a walker or a cane if necessary and consider physical therapy for balance exercise. Get your eyesight checked regularly.  HOME SAFETY: Consider the safety of the kitchen when operating appliances like stoves, microwave oven, and blender. Consider having supervision and share cooking responsibilities until no longer able to participate in those. Accidents with firearms and other hazards in the house should be identified and addressed as well.  DRIVING: Regarding driving, in patients with progressive memory problems, driving will be impaired. We advise to have someone else do the driving if trouble finding directions or if minor accidents are reported. Independent driving assessment is available to determine safety of driving.  ABILITY TO BE LEFT ALONE: If patient is unable to contact 911 operator, consider using LifeLine, or when the need is there, arrange for someone to stay with patients. Smoking is a fire hazard, consider supervision or cessation. Risk of wandering should be assessed by caregiver and if detected at any point, supervision and safe proof recommendations should be instituted.   RECOMMENDATIONS FOR ALL PATIENTS WITH MEMORY PROBLEMS: 1. Continue to exercise (Recommend 30 minutes of walking everyday, or 3 hours every week) 2. Increase social  interactions - continue going to [place] and enjoy social gatherings with friends and family 3. Eat healthy, avoid fried foods and eat more fruits and vegetables 4. Maintain adequate blood pressure, blood sugar, and blood cholesterol level. Reducing the risk of stroke and cardiovascular disease also helps promoting better memory. 5. Avoid stressful situations. Live a simple life and avoid aggravations. Organize your time and prepare for the next day in anticipation. 6. Sleep well, avoid any interruptions of sleep and avoid any distractions in the bedroom that may interfere with adequate sleep quality 7. Avoid sugar, avoid sweets as there is a strong link between excessive sugar intake, diabetes, and cognitive impairment We discussed the Mediterranean diet, which has been shown to help patients reduce the risk of progressive memory disorders and reduces cardiovascular risk. This includes eating fish, eat fruits and green leafy vegetables, nuts like almonds and hazelnuts, walnuts, and also use olive oil. Avoid fast foods and fried foods as much as possible. Avoid sweets and sugar as sugar use has been linked to worsening of memory function.  There is always a concern of gradual progression of memory problems. If this is the case, then we may need to adjust level of care according to patient needs. Support, both to the patient and caregiver, should then be put into place.

## 2017-12-23 NOTE — Progress Notes (Signed)
NEUROLOGY FOLLOW UP OFFICE NOTE  James Thomas Thomas [id number] 01/11/1934  HISTORY OF PRESENT ILLNESS: I had the pleasure of seeing James Thomas Thomas in follow-up in the neurology clinic on 12/23/2017.  The patient was last seen 7 months ago for worsening memory. He is again accompanied by his wife and daughter who help supplement the history today. Records and images were personally reviewed where available. MMSE 26/30 in May 2019. I personally reviewed MRI brain without contrast done 06/2017 which did not show any acute changes. 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 He had a sleep study in July 2019 showing severe OSA. Daughter expressed concern that it was inaccurate because they think some leads came off during the study, he was referred to Sleep Medicine but has not seen them yet. He feels like his memory is "alright." His wife thinks it is okay, better now than prior to his initial visit. He is now on B12 injections every 2 weeks. He still has some confusion with dates sometimes. He continues to drive without getting lost, but most of the time is with his wife or brother. His wife manages finances. He is religious with taking his medications. He denies any headaches, dizziness, vision changes, focal numbness/tingling/weakness. No significant personality changes, paranoia or hallucinations, however around once a week in the early morning hours, he would hear someone hollering and think it was his wife. This does not occur during the daytime.  History on Initial Assessment 05/27/2017: This is a pleasant 83 year old right-handed man with a history of hypertension, hyperlipidemia, hypothyroidism, RBBB, presenting for evaluation of memory loss. He feels his memory is pretty fair, "there are some things I can't remember." His wife and daughter started noticing minor memory changes for a year or so, but he had significant changes in both memory  and behavior in January 2019. He went from sleeping at 10pm to suddenly changing his sleep schedule to 8pm. In January and February, he would wake up at 3am and tell his wife he was hungry, asking for breakfast. They also started to notice focusing difficulties, he would watch TV and be unable to relate the storyline. his wife has to repeat instructions, part of it may be poor hearing. Family has also noticed that he does not engage in conversation unless you are talking straight to him. He would usually fall asleep if not stimulated, sleeping in his recliner during the day. His family noticed some hesitation remembering where he is going when driving the car, but as far as they know he has not gotten lost. He manages his medications without difficulties. His wife is in charge of finances. Family has noticed he is more meek/mild, no paranoia or irritability. Sometimes he hears a motor running outside the house, mostly at night. In January there were a few spells where he was convinced someone was in the house and he would have to kil them. Family brought him to his PCP that time and he was initially thought to have a UTI and briefly treated with an antibiotic, but after results came back negative, this was stopped. His TSH at that time was mildly elevated, thyroid dose was adjusted. His B12 level was 248. Repeat TSH this month is James Thomas, B12 238.   He denies any headaches, dizziness, diplopia, dysarthria, neck/back pain, focal weakness, bladder dysfunction, anosmia, or tremors. He has constipation, occasional difficulty swallowing, and constant numbness in the 1st and 2nd digits of his left  hand for the past 6 months, no pain. He needs help with buttoning his clothes but overall is independent with dressing and bathing. He has difficulty with sleep initiation. He mostly watches TV and gets little exercise. He reports feeling tired all the time and does not feel like himself. His family relates that he had been  working regularly with his brother up to 2 years ago, occasionally up to 1 year ago when his brother got sick. No family history of dementia, no history of significant head injuries or alcohol intake.  PAST MEDICAL HISTORY: Past Medical History:  Diagnosis Date  . Arthritis   . B12 deficiency    On injections  . Hypercholesteremia   . Hypertension   . Hypothyroidism   . Osteoporosis     MEDICATIONS: Current Outpatient Medications on File Prior to Visit  Medication Sig Dispense Refill  . amLODipine (NORVASC) 5 MG tablet Take 1.5 tablets (7.5 mg total) by mouth daily. 135 tablet 3  . Calcium Citrate-Vitamin D (CITRACAL + D PO) Take by mouth daily.    . cholecalciferol (VITAMIN D) 1000 UNITS tablet Take 2,000 Units by mouth daily.     Marland Kitchen docusate sodium (COLACE) 100 MG capsule Take 100 mg by mouth daily.    Marland Kitchen levothyroxine (SYNTHROID, LEVOTHROID) 75 MCG tablet TAKE 1 TABLET BY MOUTH ONCE DAILY 90 tablet 2  . losartan (COZAAR) 100 MG tablet Take 1 tablet (100 mg total) by mouth daily. 90 tablet 3  . Multiple Vitamins-Minerals (CENTRUM SILVER 50+MEN PO) Take 1 tablet by mouth daily.    . Omega-3 Fatty Acids (FISH OIL) 1200 MG CAPS Take 2 capsules by mouth daily.    . polyethylene glycol (MIRALAX / GLYCOLAX) packet Take 17 g by mouth at bedtime.    Marland Kitchen PRESCRIPTION MEDICATION Triam 0.1% CR/Generic Eucerin clay-pack (compounded by Dr. Modena Nunnery)     Current Facility-Administered Medications on File Prior to Visit  Medication Dose Route Frequency Provider Last Rate Last Dose  . cyanocobalamin ((VITAMIN B-12)) injection 1,000 mcg  1,000 mcg Intramuscular Q14 Days Dettinger, Fransisca Kaufmann, MD   1,000 mcg at 12/13/17 9678    ALLERGIES: Allergies  Allergen Reactions  . Fenofibrate Cough  . Meloxicam     Unsure but thinks it gave him a rash  . Niacin And Related Itching and Rash    FAMILY HISTORY: Family History  Problem Relation Age of Onset  . Diabetes Father   . Heart attack Maternal  Grandmother   . Heart attack Brother 31  . Anesthesia problems Neg Hx   . Hypotension Neg Hx   . Malignant hyperthermia Neg Hx   . Pseudochol deficiency Neg Hx   . Colon cancer Neg Hx     SOCIAL HISTORY: Social History   Socioeconomic History  . Marital status: Married    Spouse name: Not on file  . Number of children: 1  . Years of education: 14 years  . Highest education level: Associate degree: occupational, Hotel manager, or vocational program  Occupational History  . Occupation: Retired    Comment: Engineer, building services, bodywork  Social Needs  . Financial resource strain: Not hard at all  . Food insecurity:    Worry: Never true    Inability: Never true  . Transportation needs:    Medical: No    Non-medical: No  Tobacco Use  . Smoking status: Former Smoker    Packs/day: 1.00    Years: 10.00    Pack years: 10.00    Types: Cigarettes  Last attempt to quit: 01/27/1972    Years since quitting: 45.9  . Smokeless tobacco: Never Used  Substance and Sexual Activity  . Alcohol use: No  . Drug use: No  . Sexual activity: Never  Lifestyle  . Physical activity:    Days per week: 0 days    Minutes per session: 0 min  . Stress: Not at all  Relationships  . Social connections:    Talks on phone: More than three times a week    Gets together: More than three times a week    Attends religious service: More than 4 times per year    Active member of club or organization: Yes    Attends meetings of clubs or organizations: More than 4 times per year    Relationship status: Married  . Intimate partner violence:    Fear of current or ex partner: No    Emotionally abused: No    Physically abused: No    Forced sexual activity: No  Other Topics Concern  . 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 He is married and lives in a one story home with a basement with his wife. They have one adult daughter and two  granddaughters.     REVIEW OF SYSTEMS: Constitutional: No fevers, chills, or sweats, no generalized fatigue, change in appetite Eyes: No visual changes, double vision, eye pain Ear, nose and throat: No hearing loss, ear pain, nasal congestion, sore throat Cardiovascular: No chest pain, palpitations Respiratory:  No shortness of breath at rest or with exertion, wheezes GastrointestinaI: No nausea, vomiting, diarrhea, abdominal pain, fecal incontinence Genitourinary:  No dysuria, urinary retention or frequency Musculoskeletal:  No neck pain, back pain Integumentary: No rash, pruritus, skin lesions Neurological: as above Psychiatric: No depression, insomnia, anxiety Endocrine: No palpitations, fatigue, diaphoresis, mood swings, change in appetite, change in weight, increased thirst Hematologic/Lymphatic:  No anemia, purpura, petechiae. Allergic/Immunologic: no itchy/runny eyes, nasal congestion, recent allergic reactions, rashes  PHYSICAL EXAM: Vitals:   12/23/17 1115  BP: 128/62  Pulse: 77  SpO2: 98%   General: No acute distress Head:  Normocephalic/atraumatic Neck: supple, no paraspinal tenderness, full range of motion Heart:  Regular rate and rhythm Lungs:  Clear to auscultation bilaterally Back: No paraspinal tenderness Skin/Extremities: No rash, no edema Neurological Exam: alert and oriented to person, place, and time. No aphasia or dysarthria. Fund of knowledge is appropriate.  Recent and remote memory are intact.  Attention and concentration are James Thomas.    Able to name objects and repeat phrases. CDT 5/5  MMSE - Mini Mental State Exam 12/23/2017 05/27/2017 01/31/2017  Orientation to time 4 4 4   Orientation to Place 5 4 5   Registration 3 3 3   Attention/ Calculation 5 5 5   Recall 2 1 1   Language- name 2 objects 2 2 2   Language- repeat 1 1 1   Language- follow 3 step command 3 3 3   Language- read & follow direction 1 1 1   Write a sentence 1 1 1   Copy design 1 1 0  Total  score 28 26 26    Cranial nerves: Pupils equal, round. No facial asymmetry. Motor: moves all extremities symmetrically. Gait narrow-based and steady.  IMPRESSION: This is a pleasant 83 yo RH man with a history of hypertension, hyperlipidemia, hypothyroidism, RBBB, who presented for worsening memory, suggestive of mild cognitive impairment. He does not appear to have difficulties with  complex tasks. MMSE today 28/30 (26/30 in May 2019), family feels he is better compared to prior visit as well. He is on B12 replacement therapy. We discussed severe sleep apnea found on home sleep study, and how this could also affect cognition. He is agreeable to CPAP titration and will follow-up with Sleep Medicine. Continue to monitor driving. We again discussed the importance of control of vascular risk factors, physical exercise, and brain stimulation exercises for brain health. He will follow-up in 6 months and knows to call for any changes.   Thank you for allowing me to participate in his care.  Please do not hesitate to call for any questions or concerns.  The duration of this appointment visit was 30 minutes of face-to-face time with the patient.  Greater than 50% of this time was spent in counseling, explanation of diagnosis, planning of further management, and coordination of care.   Ellouise Newer, M.D.   CC: Dr. Warrick Parisian

## 2017-12-29 ENCOUNTER — Ambulatory Visit (INDEPENDENT_AMBULATORY_CARE_PROVIDER_SITE_OTHER): Payer: Medicare Other | Admitting: *Deleted

## 2017-12-29 DIAGNOSIS — E538 Deficiency of other specified B group vitamins: Secondary | ICD-10-CM

## 2017-12-29 NOTE — Patient Instructions (Signed)
Cyanocobalamin, Vitamin B12 injection What is this medicine? CYANOCOBALAMIN (sye an oh koe BAL a min) is a man made form of vitamin B12. Vitamin B12 is used in the growth of healthy blood cells, nerve cells, and proteins in the body. It also helps with the metabolism of fats and carbohydrates. This medicine is used to treat people who can not absorb vitamin B12. This medicine may be used for other purposes; ask your health care provider or pharmacist if you have questions. COMMON BRAND NAME(S): B-12 Compliance Kit, B-12 Injection Kit, Cyomin, LA-12, Nutri-Twelve, Physicians EZ Use B-12, Primabalt What should I tell my health care provider before I take this medicine? They need to know if you have any of these conditions: -kidney disease -Leber's disease -megaloblastic anemia -an unusual or allergic reaction to cyanocobalamin, cobalt, other medicines, foods, dyes, or preservatives -pregnant or trying to get pregnant -breast-feeding How should I use this medicine? This medicine is injected into a muscle or deeply under the skin. It is usually given by a health care professional in a clinic or doctor's office. However, your doctor may teach you how to inject yourself. Follow all instructions. Talk to your pediatrician regarding the use of this medicine in children. Special care may be needed. Overdosage: If you think you have taken too much of this medicine contact a poison control center or emergency room at once. NOTE: This medicine is only for you. Do not share this medicine with others. What if I miss a dose? If you are given your dose at a clinic or doctor's office, call to reschedule your appointment. If you give your own injections and you miss a dose, take it as soon as you can. If it is almost time for your next dose, take only that dose. Do not take double or extra doses. What may interact with this medicine? -colchicine -heavy alcohol intake This list may not describe all possible  interactions. Give your health care provider a list of all the medicines, herbs, non-prescription drugs, or dietary supplements you use. Also tell them if you smoke, drink alcohol, or use illegal drugs. Some items may interact with your medicine. What should I watch for while using this medicine? Visit your doctor or health care professional regularly. You may need blood work done while you are taking this medicine. You may need to follow a special diet. Talk to your doctor. Limit your alcohol intake and avoid smoking to get the best benefit. What side effects may I notice from receiving this medicine? Side effects that you should report to your doctor or health care professional as soon as possible: -allergic reactions like skin rash, itching or hives, swelling of the face, lips, or tongue -blue tint to skin -chest tightness, pain -difficulty breathing, wheezing -dizziness -red, swollen painful area on the leg Side effects that usually do not require medical attention (report to your doctor or health care professional if they continue or are bothersome): -diarrhea -headache This list may not describe all possible side effects. Call your doctor for medical advice about side effects. You may report side effects to FDA at 1-800-FDA-1088. Where should I keep my medicine? Keep out of the reach of children. Store at room temperature between 15 and 30 degrees C (59 and 85 degrees F). Protect from light. Throw away any unused medicine after the expiration date. NOTE: This sheet is a summary. It may not cover all possible information. If you have questions about this medicine, talk to your doctor, pharmacist, or   health care provider.  2019 Elsevier/Gold Standard (2007-04-03 22:10:20)  

## 2017-12-29 NOTE — Progress Notes (Signed)
Vitamin b12 injection given and patient tolerated well.  

## 2018-01-12 ENCOUNTER — Ambulatory Visit (INDEPENDENT_AMBULATORY_CARE_PROVIDER_SITE_OTHER): Payer: Medicare Other | Admitting: *Deleted

## 2018-01-12 DIAGNOSIS — E538 Deficiency of other specified B group vitamins: Secondary | ICD-10-CM

## 2018-01-12 NOTE — Progress Notes (Signed)
Pt given cyanocobalamin inj Tolerated well 

## 2018-01-23 ENCOUNTER — Ambulatory Visit (INDEPENDENT_AMBULATORY_CARE_PROVIDER_SITE_OTHER): Payer: Medicare Other | Admitting: Family Medicine

## 2018-01-23 ENCOUNTER — Encounter: Payer: Self-pay | Admitting: Family Medicine

## 2018-01-23 VITALS — BP 144/80 | HR 75 | Temp 96.9°F | Ht 73.0 in | Wt 186.6 lb

## 2018-01-23 DIAGNOSIS — E782 Mixed hyperlipidemia: Secondary | ICD-10-CM | POA: Diagnosis not present

## 2018-01-23 DIAGNOSIS — E538 Deficiency of other specified B group vitamins: Secondary | ICD-10-CM

## 2018-01-23 DIAGNOSIS — I10 Essential (primary) hypertension: Secondary | ICD-10-CM | POA: Diagnosis not present

## 2018-01-23 DIAGNOSIS — R7303 Prediabetes: Secondary | ICD-10-CM | POA: Diagnosis not present

## 2018-01-23 DIAGNOSIS — E039 Hypothyroidism, unspecified: Secondary | ICD-10-CM | POA: Diagnosis not present

## 2018-01-23 DIAGNOSIS — R6889 Other general symptoms and signs: Secondary | ICD-10-CM | POA: Diagnosis not present

## 2018-01-23 LAB — BAYER DCA HB A1C WAIVED: HB A1C (BAYER DCA - WAIVED): 5.6 % (ref <7.0)

## 2018-01-23 MED ORDER — LEVOTHYROXINE SODIUM 75 MCG PO TABS: 75.0000 ug | ORAL_TABLET | Freq: Every day | ORAL | 3 refills | Status: DC

## 2018-01-23 MED ORDER — AMLODIPINE BESYLATE 5 MG PO TABS: 7.5000 mg | ORAL_TABLET | Freq: Every day | ORAL | 3 refills | Status: DC

## 2018-01-23 MED ORDER — LOSARTAN POTASSIUM 100 MG PO TABS: 100.0000 mg | ORAL_TABLET | Freq: Every day | ORAL | 3 refills | Status: DC

## 2018-01-23 NOTE — Progress Notes (Signed)
BP (!) 144/80   Pulse 75   Temp (!) 96.9 F (36.1 C) (Oral)   Ht _0  (1.854 m)   Wt 186 lb 9.6 oz (84.6 kg)   BMI 24.62 kg/m    Subjective:    Patient ID: James Thomas, male    DOB: 11/20/1934, 84 y.o.   MRN: [id number]  HPI: James Thomas is a 84 y.o. male presenting on 01/23/2018 for Hypertension (4 month follow up); Hypothyroidism; and Hyperlipidemia   HPI Prediabetes Patient comes in today for recheck of his diabetes. Patient has been currently taking no medication currently we are just monitoring, we will recheck an A1c today but he has been controlled. Patient is currently on an ACE inhibitor/ARB. Patient has not seen an ophthalmologist this year. Patient denies any issues with their feet.   Hypertension Patient is currently on losartan and amlodipine, and their blood pressure today is 144/80. Patient denies any lightheadedness or dizziness. Patient denies headaches, blurred vision, chest pains, shortness of breath, or weakness. Denies any side effects from medication and is content with current medication.   Hypothyroidism recheck Patient is coming in for thyroid recheck today as well. They deny any issues with hair changes or heat or cold problems or diarrhea or constipation. They deny any chest pain or palpitations. They are currently on levothyroxine 34mcrograms   Hyperlipidemia Patient is coming in for recheck of his hyperlipidemia. The patient is currently taking omega-3's. They deny any issues with myalgias or history of liver damage from it. They deny any focal numbness or weakness or chest pain.   Relevant past medical, surgical, family and social history reviewed and updated as indicated. Interim medical history since our last visit reviewed. Allergies and medications reviewed and updated.  Review of Systems  Constitutional: Negative for chills and fever.  Eyes: Negative for visual disturbance.  Respiratory: Negative for shortness of breath and wheezing.     Cardiovascular: Negative for chest pain and leg swelling.  Musculoskeletal: Negative for back pain and gait problem.  Skin: Negative for rash.  Neurological: Negative for dizziness, weakness, light-headedness and numbness.  All other systems reviewed and are negative.   Per HPI unless specifically indicated above   Allergies as of 01/23/2018      Reactions   Fenofibrate Cough   Meloxicam    Unsure but thinks it gave him a rash   Niacin And Related Itching, Rash      Medication List       Accurate as of January 23, 2018  8:59 AM. Always use your most recent med list.        amLODipine 5 MG tablet Commonly known as:  NORVASC Take 1.5 tablets (7.5 mg total) by mouth daily.   CENTRUM SILVER 50+MEN PO Take 1 tablet by mouth daily.   cholecalciferol 1000 units tablet Commonly known as:  VITAMIN D Take 2,000 Units by mouth daily.   CITRACAL + D PO Take by mouth daily.   docusate sodium 100 MG capsule Commonly known as:  COLACE Take 100 mg by mouth daily.   Fish Oil 1200 MG Caps Take 2 capsules by mouth daily.   levothyroxine 75 MCG tablet Commonly known as:  SYNTHROID, LEVOTHROID Take 1 tablet (75 mcg total) by mouth daily.   losartan 100 MG tablet Commonly known as:  COZAAR Take 1 tablet (100 mg total) by mouth daily.   polyethylene glycol packet Commonly known as:  MIRALAX / GLYCOLAX Take 17 g by mouth at  bedtime.   PRESCRIPTION MEDICATION Triam 0.1% CR/Generic Eucerin clay-pack (compounded by Dr. Modena Nunnery)   vitamin B-12 500 MCG tablet Commonly known as:  CYANOCOBALAMIN Take 500 mcg by mouth daily.          Objective:    BP (!) 144/80   Pulse 75   Temp (!) 96.9 F (36.1 C) (Oral)   Ht _0  (1.854 m)   Wt 186 lb 9.6 oz (84.6 kg)   BMI 24.62 kg/m   Wt Readings from Last 3 Encounters:  01/23/18 186 lb 9.6 oz (84.6 kg)  12/23/17 186 lb (84.4 kg)  11/24/17 187 lb 6.4 oz (85 kg)    Physical Exam Vitals signs and nursing note reviewed.   Constitutional:      General: He is not in acute distress.    Appearance: He is well-developed. He is not diaphoretic.  Eyes:     General: No scleral icterus.    Conjunctiva/sclera: Conjunctivae normal.  Neck:     Musculoskeletal: Neck supple.     Thyroid: No thyromegaly.  Cardiovascular:     Rate and Rhythm: Normal rate and regular rhythm.     Heart sounds: Normal heart sounds. No murmur.  Pulmonary:     Effort: Pulmonary effort is normal. No respiratory distress.     Breath sounds: Normal breath sounds. No wheezing.  Musculoskeletal: Normal range of motion.  Lymphadenopathy:     Cervical: No cervical adenopathy.  Skin:    General: Skin is warm and dry.     Findings: No rash.  Neurological:     Mental Status: He is alert and oriented to person, place, and time.     Coordination: Coordination normal.  Psychiatric:        Behavior: Behavior normal.         Assessment & Plan:   Problem List Items Addressed This Visit      Cardiovascular and Mediastinum   HTN (hypertension) - Primary   Relevant Medications   amLODipine (NORVASC) 5 MG tablet   losartan (COZAAR) 100 MG tablet   Other Relevant Orders   CMP14+EGFR     Endocrine   Hypothyroid   Relevant Medications   levothyroxine (SYNTHROID, LEVOTHROID) 75 MCG tablet   Other Relevant Orders   CBC with Differential/Platelet   TSH     Other   HLD (hyperlipidemia)   Relevant Medications   amLODipine (NORVASC) 5 MG tablet   losartan (COZAAR) 100 MG tablet   Other Relevant Orders   Lipid panel   Prediabetes   Relevant Orders   CMP14+EGFR   Bayer DCA Hb A1c Waived       Follow up plan: Return in about 6 months (around 07/24/2018), or if symptoms worsen or fail to improve, for Patient needs every 2-week appointment for his B12 injections and see me in 6 months.  Counseling provided for all of the vaccine components Orders Placed This Encounter  Procedures  . CBC with Differential/Platelet  . CMP14+EGFR  .  Lipid panel  . TSH  . Bayer [facility] Hb A1c [place], MD [facility] 01/23/2018, 8:59 AM

## 2018-01-24 LAB — CMP14+EGFR
A/G RATIO: 1.7 (ref 1.2–2.2)
ALT: 12 IU/L (ref 0–44)
AST: 16 IU/L (ref 0–40)
Albumin: 4.1 g/dL (ref 3.6–4.6)
Alkaline Phosphatase: 116 IU/L (ref 39–117)
BUN/Creatinine Ratio: 7 — ABNORMAL LOW (ref 10–24)
BUN: 8 mg/dL (ref 8–27)
Bilirubin Total: 0.6 mg/dL (ref 0.0–1.2)
CO2: 26 mmol/L (ref 20–29)
Calcium: 9.7 mg/dL (ref 8.6–10.2)
Chloride: 100 mmol/L (ref 96–106)
Creatinine, Ser: 1.16 mg/dL (ref 0.76–1.27)
GFR calc Af Amer: 68 mL/min/{1.73_m2} (ref >59)
GFR, EST NON AFRICAN AMERICAN: 59 mL/min/{1.73_m2} — AB (ref >59)
Globulin, Total: 2.4 g/dL (ref 1.5–4.5)
Glucose: 144 mg/dL — ABNORMAL HIGH (ref 65–99)
POTASSIUM: 3.9 mmol/L (ref 3.5–5.2)
Sodium: 140 mmol/L (ref 134–144)
Total Protein: 6.5 g/dL (ref 6.0–8.5)

## 2018-01-24 LAB — LIPID PANEL
CHOL/HDL RATIO: 8.3 ratio — AB (ref 0.0–5.0)
Cholesterol, Total: 208 mg/dL — ABNORMAL HIGH (ref 100–199)
HDL: 25 mg/dL — ABNORMAL LOW (ref >39)
LDL Calculated: 119 mg/dL — ABNORMAL HIGH (ref 0–99)
Triglycerides: 318 mg/dL — ABNORMAL HIGH (ref 0–149)
VLDL Cholesterol Cal: 64 mg/dL — ABNORMAL HIGH (ref 5–40)

## 2018-01-24 LAB — CBC WITH DIFFERENTIAL/PLATELET
Basophils Absolute: 0.1 10*3/uL (ref 0.0–0.2)
Basos: 1 %
EOS (ABSOLUTE): 0.4 10*3/uL (ref 0.0–0.4)
Eos: 6 %
Hematocrit: 42.2 % (ref 37.5–51.0)
Hemoglobin: 14.9 g/dL (ref 13.0–17.7)
Immature Grans (Abs): 0 10*3/uL (ref 0.0–0.1)
Immature Granulocytes: 0 %
Lymphocytes Absolute: 1.1 10*3/uL (ref 0.7–3.1)
Lymphs: 15 %
MCH: 31.3 pg (ref 26.6–33.0)
MCHC: 35.3 g/dL (ref 31.5–35.7)
MCV: 89 fL (ref 79–97)
Monocytes Absolute: 0.5 10*3/uL (ref 0.1–0.9)
Monocytes: 7 %
NEUTROS PCT: 71 %
Neutrophils Absolute: 5.1 10*3/uL (ref 1.4–7.0)
Platelets: 311 10*3/uL (ref 150–450)
RBC: 4.76 x10E6/uL (ref 4.14–5.80)
RDW: 13.1 % (ref 11.6–15.4)
WBC: 7.2 10*3/uL (ref 3.4–10.8)

## 2018-01-24 LAB — TSH: TSH: 4.04 u[IU]/mL (ref 0.450–4.500)

## 2018-01-25 ENCOUNTER — Other Ambulatory Visit: Payer: Self-pay | Admitting: *Deleted

## 2018-01-25 DIAGNOSIS — E538 Deficiency of other specified B group vitamins: Secondary | ICD-10-CM

## 2018-01-26 ENCOUNTER — Encounter: Payer: Self-pay | Admitting: Family Medicine

## 2018-01-26 LAB — VITAMIN B12: Vitamin B-12: >2000 pg/mL — ABNORMAL HIGH (ref 232–1245)

## 2018-01-31 ENCOUNTER — Encounter: Payer: Self-pay | Admitting: Pulmonary Disease

## 2018-01-31 ENCOUNTER — Ambulatory Visit (INDEPENDENT_AMBULATORY_CARE_PROVIDER_SITE_OTHER): Payer: Medicare Other | Admitting: Pulmonary Disease

## 2018-01-31 VITALS — BP 124/72 | HR 75 | Ht 73.0 in | Wt 184.0 lb

## 2018-01-31 DIAGNOSIS — G4733 Obstructive sleep apnea (adult) (pediatric): Secondary | ICD-10-CM

## 2018-01-31 DIAGNOSIS — R413 Other amnesia: Secondary | ICD-10-CM

## 2018-01-31 NOTE — Assessment & Plan Note (Addendum)
discussed home sleep study which showed severe obstructive sleep apnea.  The pathophysiology of obstructive sleep apnea , it's cardiovascular consequences & modes of treatment including CPAP were discused with the patient in detail & they evidenced understanding.  He does seem to have a positional component.  But I doubt positional therapy only would be effective also dental device would be problematic in this age group Trial of auto CPAP 5 to 15 cm with nasal pillows and humidity Try to use this at least 6 hours every night  This would be proof of concept and if it works we will try to find the right mask interface to get him comfortable.  To see improvement in his energy levels and memory

## 2018-01-31 NOTE — Progress Notes (Signed)
Subjective:    Patient ID: James Thomas, male    DOB: 10-07-1934, 84 y.o.   MRN: [id number]  HPI  Chief Complaint  Patient presents with  . Sleep Consult    Referred by James Thomas for James Thomas.     84 year old referred by neurology for management of obstructive sleep apnea. He developed behavior and memory issues and was evaluated by cardiology and neurology.  He was found to have a normal MRI, low vitamin B12 levels which were repleted. He has always had a bedtime around 10 PM but in the last year his bedtime has shifted earlier initially around 8 PM and then around 6 PM now sometimes.  He would always wake up around 6:30 AM and for a while when he was sleeping at 6 PM he would be up around 3 AM.  After getting B12 shots, his daughter reports that his energy levels are slightly improved and he is able to stay in bed slightly longer up to about 6:30 AM however he is often up several times during the night. His wife sleeps in a recliner in a different room and he will sometimes wake up and join her in a different recliner in that room and occasionally will come back to bed.  Epworth sleepiness score is 1 and he denies excessive daytime napping or somnolence but does admit to low energy levels and states that he just does not want to do things. Sleep latency can be 45 minutes to an hour, he generally sleeps on his back on his side with 1-2 pillows and has 1-2 nocturnal awakenings but denies nocturia.  He is up around 6:30 AM feels rested, without dryness of mouth or headaches. Mild snoring has been noted by his wife. 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 He quit smoking in 1975.  Hypertension is controlled with 2 medications  Significant tests/ events reviewed  HST 07/2017 severe OSA with AHI 31/hour, predominantly supine with AHI 42/hour, AHI was only 8/hour for 2 hours in the right lateral position    Past Medical  History:  Diagnosis Date  . Arthritis   . B12 deficiency    On injections  . Hypercholesteremia   . Hypertension   . Hypothyroidism   . Osteoporosis     Past Surgical History:  Procedure Laterality Date  . BACK SURGERY  1979;1985   slipped disc;ruptured disc  . CATARACT EXTRACTION W/PHACO  02/01/2011   Procedure: CATARACT EXTRACTION PHACO AND INTRAOCULAR LENS PLACEMENT (I[facility]);  Surgeon: Tonny Branch, MD;  Location: [facility];  Service: Ophthalmology;  Laterality: Right;  CDE:18.53    Allergies  Allergen Reactions  . Fenofibrate Cough  . Meloxicam     Unsure but thinks it gave him a rash  . Niacin And Related Itching and Rash    Social History   Socioeconomic History  . Marital status: Married    Spouse name: Not on file  . Number of children: 1  . Years of education: 14 years  . Highest education level: Associate degree: occupational, Hotel manager, or vocational program  Occupational History  . Occupation: Retired    Comment: [employer], bodywork  Social Needs  . Financial resource strain: Not hard at all  . Food insecurity:    Worry: Never true    Inability: Never true  . Transportation needs:    Medical: No    Non-medical: No  Tobacco Use  .  Smoking status: Former Smoker    Packs/day: 1.00    Years: 10.00    Pack years: 10.00    Types: Cigarettes    Last attempt to quit: 01/27/1972    Years since quitting: 46.0  . Smokeless tobacco: Never Used  Substance and Sexual Activity  . Alcohol use: No  . Drug use: No  . Sexual activity: Never  Lifestyle  . Physical activity:    Days per week: 0 days    Minutes per session: 0 min  . Stress: Not at all  Relationships  . Social connections:    Talks on phone: More than three times a week    Gets together: More than three times a week    Attends religious service: More than 4 times per year    Active member of club or organization: Yes    Attends meetings of clubs or organizations: More than 4 times per year     Relationship status: Married  . Intimate partner violence:    Fear of current or ex partner: No    Emotionally abused: No    Physically abused: No    Forced sexual activity: No  Other Topics Concern  . Not on file  Social History Narrative   James Thomas is a retired [employer]. He has been retired for about 4 years. He is married and lives in a one story home with a basement with his wife. They have one adult daughter and two granddaughters.      Family History  Problem Relation Age of Onset  . Diabetes Father   . Heart attack Maternal Grandmother   . Heart attack Brother 44  . Anesthesia problems Neg Hx   . Hypotension Neg Hx   . Malignant hyperthermia Neg Hx   . Pseudochol deficiency Neg Hx   . Colon cancer Neg Hx     Review of Systems  Constitutional: Positive for appetite change and unexpected weight change. Negative for fever.  HENT: Negative for congestion, dental problem, ear pain, nosebleeds, postnasal drip, rhinorrhea, sinus pressure, sneezing, sore throat and trouble swallowing.   Eyes: Negative for redness and itching.  Respiratory: Negative for cough, chest tightness, shortness of breath and wheezing.   Cardiovascular: Negative for palpitations and leg swelling.  Gastrointestinal: Negative for nausea and vomiting.  Genitourinary: Negative for dysuria.  Musculoskeletal: Positive for joint swelling.  Skin: Negative for rash.  Allergic/Immunologic: Negative.  Negative for environmental allergies, food allergies and immunocompromised state.  Neurological: Negative for headaches.  Hematological: Does not bruise/bleed easily.  Psychiatric/Behavioral: Negative for dysphoric mood. The patient is not nervous/anxious.        Objective:   Physical Exam  Gen. Pleasant, well-nourished, elderly, in no distress, normal affect ENT - no pallor,icterus, no post nasal drip, class 2 airway, mild underbite Neck: No JVD, no thyromegaly, no carotid bruits Lungs: no use of  accessory muscles, no dullness to percussion, clear without rales or rhonchi  Cardiovascular: Rhythm regular, heart sounds  normal, no murmurs or gallops, no peripheral edema Abdomen: soft and non-tender, no hepatosplenomegaly, BS normal. Musculoskeletal: No deformities, no cyanosis or clubbing Neuro:  alert, non focal       Assessment & Plan:

## 2018-01-31 NOTE — Patient Instructions (Signed)
   We discussed home sleep study which showed severe obstructive sleep apnea. Trial of auto CPAP 5 to 15 cm with nasal pillows and humidity Try to use this at least 6 hours every night

## 2018-01-31 NOTE — Assessment & Plan Note (Signed)
Expect to see some improvement in energy levels and memory after CPAP trial

## 2018-02-01 NOTE — Telephone Encounter (Signed)
I do not think this is related to sleep or memory but would suggest swallowing evaluation with speech therapist present

## 2018-02-01 NOTE — Telephone Encounter (Signed)
Dr. Elsworth Soho, this message was sent this morning from [facility] Daughter.  This is James Thomas, Alexsandro Muratalla's daughter. Since you showed [place] that my dad is having more issues while on his back, I have a question. He is having issues at least once a week getting choked on his food. Until Thanksgiving I had thought it was only occassionally. I have personally seen him have 3 pretty serious episodes since Thanksgiving. He says it feels like he cannot get the food to go up or down but just sits in his throat. He coughs and coughs but appears to be still breathing through his nose. He will have to cough with heavy phlegm until he coughs it up. Could these 2 issues be related? I would have never thought to ask until you showed [place] the chart and he had one of these episodes at lunch after our visit to you yesterday.

## 2018-02-02 ENCOUNTER — Telehealth: Payer: Self-pay | Admitting: *Deleted

## 2018-02-02 ENCOUNTER — Ambulatory Visit (INDEPENDENT_AMBULATORY_CARE_PROVIDER_SITE_OTHER): Payer: Medicare Other | Admitting: *Deleted

## 2018-02-02 ENCOUNTER — Encounter: Payer: Self-pay | Admitting: *Deleted

## 2018-02-02 VITALS — BP 133/75 | HR 77 | Ht 73.0 in | Wt 186.0 lb

## 2018-02-02 DIAGNOSIS — Z23 Encounter for immunization: Secondary | ICD-10-CM | POA: Diagnosis not present

## 2018-02-02 NOTE — Patient Instructions (Signed)
Please work on your goal of reading more often.   At your convenience, please bring a copy of your Advance Directives (Healthcare Power of Attorney and Living Will) to our office to be filed in your medical record.  We will check with Dr. Warrick Parisian on ordering a swallow study.  You received your first Shingrix (Shingles vaccine) today.  You will need the 2nd dose after 04/03/2018.   Please continue to move carefully to avoid falls.  Please follow up with Dr. Warrick Parisian and your specialists as scheduled.  Thank you for coming in for your Annual Wellness Visit today!   Preventive Care 32 Years and Older, Male Preventive care refers to lifestyle choices and visits with your health care provider that can promote health and wellness. What does preventive care include?   A yearly physical exam. This is also called an annual well check.  Dental exams once or twice a year.  Routine eye exams. Ask your health care provider how often you should have your eyes checked.  Personal lifestyle choices, including: ? Daily care of your teeth and gums. ? Regular physical activity. ? Eating a healthy diet. ? Avoiding tobacco and drug use. ? Limiting alcohol use. ? Practicing safe sex. ? Taking low doses of aspirin every day. ? Taking vitamin and mineral supplements as recommended by your health care provider. What happens during an annual well check? The services and screenings done by your health care provider during your annual well check will depend on your age, overall health, lifestyle risk factors, and family history of disease. Counseling Your health care provider may ask you questions about your:  Alcohol use.  Tobacco use.  Drug use.  Emotional well-being.  Home and relationship well-being.  Sexual activity.  Eating habits.  History of falls.  Memory and ability to understand (cognition).  Work and work Statistician. Screening You may have the following tests or  measurements:  Height, weight, and BMI.  Blood pressure.  Lipid and cholesterol levels. These may be checked every 5 years, or more frequently if you are over 40 years old.  Skin check.  Lung cancer screening. You may have this screening every year starting at age 38 if you have a 30-pack-year history of smoking and currently smoke or have quit within the past 15 years.  Colorectal cancer screening. All adults should have this screening starting at age 20 and continuing until age 70. You will have tests every 1-10 years, depending on your results and the type of screening test. People at increased risk should start screening at an earlier age. Screening tests may include: ? Guaiac-based fecal occult blood testing. ? Fecal immunochemical test (FIT). ? Stool DNA test. ? Virtual colonoscopy. ? Sigmoidoscopy. During this test, a flexible tube with a tiny camera (sigmoidoscope) is used to examine your rectum and lower colon. The sigmoidoscope is inserted through your anus into your rectum and lower colon. ? Colonoscopy. During this test, a long, thin, flexible tube with a tiny camera (colonoscope) is used to examine your entire colon and rectum.  Prostate cancer screening. Recommendations will vary depending on your family history and other risks.  Hepatitis C blood test.  Hepatitis B blood test.  Sexually transmitted disease (STD) testing.  Diabetes screening. This is done by checking your blood sugar (glucose) after you have not eaten for a while (fasting). You may have this done every 1-3 years.  Abdominal aortic aneurysm (AAA) screening. You may need this if you are a current or  former smoker.  Osteoporosis. You may be screened starting at age 65 if you are at high risk. Talk with your health care provider about your test results, treatment options, and if necessary, the need for more tests. Vaccines Your health care provider may recommend certain vaccines, such as:  Influenza  vaccine. This is recommended every year.  Tetanus, diphtheria, and acellular pertussis (Tdap, Td) vaccine. You may need a Td booster every 10 years.  Varicella vaccine. You may need this if you have not been vaccinated.  Zoster vaccine. You may need this after age 67.  Measles, mumps, and rubella (MMR) vaccine. You may need at least one dose of MMR if you were born in 1957 or later. You may also need a second dose.  Pneumococcal 13-valent conjugate (PCV13) vaccine. One dose is recommended after age 82.  Pneumococcal polysaccharide (PPSV23) vaccine. One dose is recommended after age 59.  Meningococcal vaccine. You may need this if you have certain conditions.  Hepatitis A vaccine. You may need this if you have certain conditions or if you travel or work in places where you may be exposed to hepatitis A.  Hepatitis B vaccine. You may need this if you have certain conditions or if you travel or work in places where you may be exposed to hepatitis B.  Haemophilus influenzae type b (Hib) vaccine. You may need this if you have certain risk factors. Talk to your health care provider about which screenings and vaccines you need and how often you need them. This information is not intended to replace advice given to you by your health care provider. Make sure you discuss any questions you have with your health care provider. Document Released: 01/17/2015 Document Revised: 02/10/2017 Document Reviewed: 10/22/2014 Elsevier Interactive Patient Education  2019 [employer].

## 2018-02-02 NOTE — Progress Notes (Addendum)
Subjective:   James Thomas is a 84 y.o. male who presents for a sequential Medicare Annual Wellness Visit.  James Thomas is accompanied today by his wife James Thomas.  They have been married 27 years and have one daughter and 2 grand daughters.  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  He states he still enjoys working on cars.  James Thomas attends church regularly.  He has recently been seeing specialists Dr. Delice Lesch and Dr. Elsworth Soho for mild congnitive impairment and sleep apnea.  Of note, spoke with patient's daughter on the phone at the request of patient and his wife.  She states patient has had 3 choking episodes in the past couple of months.  James Thomas (patient's daughter) and her parents would like to pursue having a swallow study done with a speech therapist present.  Will check with Dr. Warrick Parisian regarding ordering a swallow study.  Patient Care Team: James Thomas, James Kaufmann, MD as PCP - General (Family Medicine) James Craze, MD as Referring Physician (Dermatology) James Binder, MD as Consulting Physician (Gastroenterology) James Noel, MD as Consulting Physician (Pulmonary Disease) James Sprang, MD as Consulting Physician (Neurology) James Breeding, MD as Consulting Physician (Cardiology)  Hospitalizations, surgeries, and ER visits in previous 12 months No hospitalizations, ER visits, or surgeries this past year.   Review of Systems    Patient reports that his overall health is unchanged compared to last year.  Cardiac Risk Factors include: advanced age (>64men, >89 women);dyslipidemia;family history of premature cardiovascular disease;hypertension;male gender   All other systems negative       Current Medications (verified) Outpatient Encounter Medications as of 02/02/2018  Medication Sig  . amLODipine (NORVASC) 5 MG tablet Take 1.5 tablets (7.5 mg total) by mouth daily.  . Calcium Citrate-Vitamin D (CITRACAL +  D PO) Take by mouth daily.  . cholecalciferol (VITAMIN D) 1000 UNITS tablet Take 2,000 Units by mouth daily.   Marland Kitchen docusate sodium (COLACE) 100 MG capsule Take 100 mg by mouth daily.  Marland Kitchen ELDERBERRY PO Take by mouth daily.  Marland Kitchen levothyroxine (SYNTHROID, LEVOTHROID) 75 MCG tablet Take 1 tablet (75 mcg total) by mouth daily.  Marland Kitchen losartan (COZAAR) 100 MG tablet Take 1 tablet (100 mg total) by mouth daily.  . Multiple Vitamins-Minerals (CENTRUM SILVER 50+MEN PO) Take 1 tablet by mouth daily.  . Omega-3 Fatty Acids (FISH OIL) 1200 MG CAPS Take 2 capsules by mouth daily.  . polyethylene glycol (MIRALAX / GLYCOLAX) packet Take 17 g by mouth at bedtime.  Marland Kitchen PRESCRIPTION MEDICATION Triam 0.1% CR/Generic Eucerin clay-pack (compounded by Dr. Modena Nunnery)  . vitamin B-12 (CYANOCOBALAMIN) 500 MCG tablet Take 500 mcg by mouth daily.   Facility-Administered Encounter Medications as of 02/02/2018  Medication  . cyanocobalamin ((VITAMIN B-12)) injection 1,000 mcg    Allergies (verified) Fenofibrate; Meloxicam; and Niacin and related   History: Past Medical History:  Diagnosis Date  . Arthritis   . B12 deficiency    On injections  . Hypercholesteremia   . Hypertension   . Hypothyroidism   . Osteoporosis    Past Surgical History:  Procedure Laterality Date  . BACK SURGERY  1979;1985   slipped disc;ruptured disc  . CATARACT EXTRACTION W/PHACO  02/01/2011   Procedure: CATARACT EXTRACTION PHACO AND INTRAOCULAR LENS PLACEMENT (IOC);  Surgeon: Tonny Branch, MD;  Location: [facility];  Service: Ophthalmology;  Laterality: Right;  CDE:18.53   Family History  Problem Relation  Age of Onset  . Diabetes Father   . Heart attack Maternal Grandmother   . Heart attack Brother 48  . Anesthesia problems Neg Hx   . Hypotension Neg Hx   . Malignant hyperthermia Neg Hx   . Pseudochol deficiency Neg Hx   . Colon cancer Neg Hx    Social History   Socioeconomic History  . Marital status: Married    Spouse name: Not on file    . Number of children: 1  . Years of education: 14 years  . Highest education level: Associate degree: occupational, Hotel manager, or vocational program  Occupational History  . Occupation: Retired    Comment: Engineer, building services, bodywork  Social Needs  . Financial resource strain: Not hard at all  . Food insecurity:    Worry: Never true    Inability: Never true  . Transportation needs:    Medical: No    Non-medical: No  Tobacco Use  . Smoking status: Former Smoker    Packs/day: 1.00    Years: 10.00    Pack years: 10.00    Types: Cigarettes    Last attempt to quit: 01/27/1972    Years since quitting: 46.0  . Smokeless tobacco: Never Used  Substance and Sexual Activity  . Alcohol use: No  . Drug use: No  . Sexual activity: Not on file  Lifestyle  . Physical activity:    Days per week: 0 days    Minutes per session: 0 min  . Stress: Not at all  Relationships  . Social connections:    Talks on phone: More than three times a week    Gets together: More than three times a week    Attends religious service: More than 4 times per year    Active member of club or organization: Yes    Attends meetings of clubs or organizations: More than 4 times per year    Relationship status: Married  Other Topics Concern  . 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 He is married and lives in a one story home with a basement with his wife. They have one adult daughter and two granddaughters.      Clinical Intake:     Pain Score: 0-No pain                  Activities of Daily Living In your present state of health, do you have any difficulty performing the following activities: 02/02/2018  Hearing? Y  Comment Wears hearing aids bilaterally  Vision? N  Difficulty concentrating or making decisions? Y  Comment Trouble remembering   Walking or climbing stairs? N  Dressing or bathing? N  Doing errands,  shopping? N  Preparing Food and eating ? N  Using the Toilet? N  In the past six months, have you accidently leaked urine? Y  Comment Occasional   Do you have problems with loss of bowel control? N  Managing your Medications? N  Managing your Finances? N  Housekeeping or managing your Housekeeping? N  Some recent data might be hidden     Exercise Current Exercise Habits: The patient does not participate in regular exercise at present Diet- patient eats 3 meals per day and snacks as needed.  He states he has access to all the food he needs.  Recommended a diet of mostly non-starchy vegetables, fruits, lean proteins, and whole grains.  Depression Screen  PHQ 2/9 Scores 02/02/2018 01/23/2018 09/19/2017 08/18/2017 05/19/2017 02/16/2017 02/07/2017  PHQ - 2 Score 0 0 0 0 0 0 0     Fall Risk Fall Risk  01/23/2018 12/23/2017 09/19/2017 08/18/2017 05/27/2017  Falls in the past year? 0 0 No No Yes  Number falls in past yr: - 0 - - 1  Injury with Fall? - 0 - - No  Comment - - - - -     Objective:    Today's Vitals   02/02/18 0958  BP: 133/75  Pulse: 77  Weight: 186 lb (84.4 kg)  Height: 6\' 1"  (1.854 m)  PainSc: 0-No pain   Body mass index is 24.54 kg/m.  Advanced Directives 02/02/2018 01/31/2017 01/27/2011  Does Patient Have a Medical Advance Directive? Yes Yes Patient has advance directive, copy not in chart  Type of Advance Directive [place];Living will [place];Living will Living will  Does patient want to make changes to medical advance directive? No - Patient declined No - Patient declined -  Copy of [place] in Chart? No - copy requested No - copy requested Copy requested from family  Pre-existing out of facility DNR order (yellow form or pink MOST form) - - No    Hearing/Vision  Patient wears hearing aids bilaterally. Wears glasses, states he sees well with them.  Cognitive Function: MMSE - Mini Mental State Exam  02/02/2018 12/23/2017 05/27/2017 01/31/2017  Orientation to time 3 4 4 4   Orientation to Place 5 5 4 5   Registration 3 3 3 3   Attention/ Calculation 5 5 5 5   Recall 3 2 1 1   Language- name 2 objects 2 2 2 2   Language- repeat 1 1 1 1   Language- follow 3 step command 3 3 3 3   Language- read & follow direction 1 1 1 1   Write a sentence 1 1 1 1   Copy design 1 1 1  0  Total score 28 28 26 26            Immunizations and Health Maintenance Immunization History  Administered Date(s) Administered  . Influenza, High Dose Seasonal PF 10/22/2015, 10/22/2016, 09/28/2017  . Influenza,inj,Quad PF,6+ Mos 10/20/2012, 10/05/2013, 10/22/2014  . Pneumococcal Conjugate-13 01/31/2017  . Pneumococcal Polysaccharide-23 10/20/2012  . Tdap 08/18/2010  . Zoster 10/05/2006   There are no preventive care reminders to display for this patient. Health Maintenance  Topic Date Due  . TETANUS/TDAP  08/17/2020  . INFLUENZA VACCINE  Completed  . PNA vac Low Risk Adult  Completed   1st dose of Shingrix vaccine given today.      Assessment:   This is a routine wellness examination for [place].    Plan:    Goals    . Patient Stated (pt-stated)     Read books more often.         Health Maintenance & Additional Screening Recommendations: Swallow study- will discuss with Dr. Warrick Parisian.  Lung: Low Dose CT Chest recommended if Age 2-80 years, 30 pack-year currently smoking OR have quit w/in 15years. Patient does not qualify. Hepatitis C Screening recommended: not applicable  Today's Orders Shingrix vaccine   Keep f/u with James Thomas, James Kaufmann, MD and any other specialty appointments you may have Continue current medications Move carefully to avoid falls. Aim for at least 150 minutes of moderate activity a week. This can be done with chair exercises if necessary. Read or work on puzzles daily Stay connected with friends and family Bring a copy  of your Advance Directives (Healthcare Power of  Attorney and Living Will) to our office to be filed in your medical record.  I have personally reviewed and noted the following in the patient's chart:   . Medical and social history . Use of alcohol, tobacco or illicit drugs  . Current medications and supplements . Functional ability and status . Nutritional status . Physical activity . Advanced directives . List of other physicians . Hospitalizations, surgeries, and ER visits in previous 12 months . Vitals . Screenings to include cognitive, depression, and falls . Referrals and appointments  In addition, I have reviewed and discussed with patient certain preventive protocols, quality metrics, and best practice recommendations. A written personalized care plan for preventive services as well as general preventive health recommendations were provided to patient.     Nolberto Hanlon, RN  02/02/2018     I have reviewed and agree with the above AWV documentation.   Evelina Dun, FNP

## 2018-02-02 NOTE — Telephone Encounter (Signed)
Spoke with patient's daughter on the phone at the request of patient and his wife during [place].  She states patient has had 3 choking episodes in the past couple of months.  [name] (patient's daughter) and her parents would like to pursue having a swallow study done with a speech therapist present.  Will check with Dr. Warrick Parisian regarding ordering a swallow study.  Dr. Warrick Parisian please advise.  If you order please send back to pools so that patient and daughter can be notified as I will be out of the office on 02/03/2018.

## 2018-02-03 ENCOUNTER — Other Ambulatory Visit: Payer: Self-pay | Admitting: Family Medicine

## 2018-02-03 DIAGNOSIS — R1310 Dysphagia, unspecified: Secondary | ICD-10-CM

## 2018-02-03 NOTE — Telephone Encounter (Signed)
Order placed for OP rehab for swallowing study with speech pathologist

## 2018-02-03 NOTE — Progress Notes (Unsigned)
Ordered swallow study through speech therapy referral

## 2018-02-03 NOTE — Telephone Encounter (Signed)
I already sent this to you but they asked and another message, I do not fully know how to order this if you could help me with that that would be appreciated.

## 2018-02-06 ENCOUNTER — Ambulatory Visit (INDEPENDENT_AMBULATORY_CARE_PROVIDER_SITE_OTHER): Payer: Medicare Other | Admitting: *Deleted

## 2018-02-06 DIAGNOSIS — E538 Deficiency of other specified B group vitamins: Secondary | ICD-10-CM

## 2018-02-06 NOTE — Progress Notes (Signed)
Pt given Cyanocobalamin inj Tolerated well 

## 2018-02-10 NOTE — Progress Notes (Signed)
Pt scheduled 02/13/2018 at [place]

## 2018-02-13 ENCOUNTER — Ambulatory Visit (HOSPITAL_COMMUNITY)
Admission: RE | Admit: 2018-02-13 | Discharge: 2018-02-13 | Disposition: A | Discharge status: Home / Self Care | Payer: Medicare Other | Source: Ambulatory Visit | Attending: Family Medicine | Admitting: Family Medicine

## 2018-02-13 ENCOUNTER — Encounter (HOSPITAL_COMMUNITY): Payer: Self-pay | Admitting: Speech Pathology

## 2018-02-13 ENCOUNTER — Other Ambulatory Visit (HOSPITAL_COMMUNITY): Payer: Self-pay | Admitting: Specialist

## 2018-02-13 ENCOUNTER — Other Ambulatory Visit: Payer: Self-pay

## 2018-02-13 ENCOUNTER — Ambulatory Visit (HOSPITAL_COMMUNITY): Payer: Medicare Other | Attending: Family Medicine | Admitting: Speech Pathology

## 2018-02-13 DIAGNOSIS — R1310 Dysphagia, unspecified: Secondary | ICD-10-CM | POA: Diagnosis not present

## 2018-02-13 DIAGNOSIS — R1319 Other dysphagia: Secondary | ICD-10-CM

## 2018-02-13 DIAGNOSIS — R1312 Dysphagia, oropharyngeal phase: Secondary | ICD-10-CM | POA: Diagnosis not present

## 2018-02-13 NOTE — Therapy (Signed)
[facility] [place], [place], [address] Phone: [phone]   Fax:  [phone]  Modified Barium Swallow  Patient Details  Name: James Thomas MRN: [id number] Date of Birth: 03-09-1934 No data recorded  Encounter Date: 02/13/2018  End of Session - 02/13/18 1448    Visit Number  1    Number of Visits  1    Authorization Type  Medicare    SLP Start Time  9    SLP Stop Time   1340    SLP Time Calculation (min)  40 min    Activity Tolerance  Patient tolerated treatment well       Past Medical History:  Diagnosis Date  . Arthritis   . B12 deficiency    On injections  . Hypercholesteremia   . Hypertension   . Hypothyroidism   . Osteoporosis     Past Surgical History:  Procedure Laterality Date  . BACK SURGERY  1979;1985   slipped disc;ruptured disc  . CATARACT EXTRACTION W/PHACO  02/01/2011   Procedure: CATARACT EXTRACTION PHACO AND INTRAOCULAR LENS PLACEMENT (IOC);  Surgeon: Tonny Branch, MD;  Location: [facility];  Service: Ophthalmology;  Laterality: Right;  CDE:18.53    There were no vitals filed for this visit.  Subjective Assessment - 02/13/18 1432    Subjective  "I got choked on cornbread recently."    Patient is accompained by:  Family member    Special Tests  MBSS    Currently in Pain?  No/denies        General - 02/13/18 1433      General Information   Date of Onset  02/02/18    HPI  Mr. James Thomas is an 84 yo male who was referred by Dr. Vonna Kotyk Dettinger for MBSS due to Pt with reports of occasional "choking" episodes during intake of solid foods. He is accompanied to MBSS by his wife and daughter who assisted in providing some background information. His daughter reports that Pt showed signs of cognitive decline in January of 2019. He has since been started on Vitamin B-12 injections which they report has been beneficial. His family reports occasional coughing episodes with solid foods for the past  few years. No reports of recent URI or PNA. Pt indicates that he does not seem to have trouble with liquids.    Type of Study  MBS-Modified Barium Swallow Study    Previous Swallow Assessment  None on record    Diet Prior to this Study  Regular;Thin liquids    Temperature Spikes Noted  No    Respiratory Status  Room air    History of Recent Intubation  No    Behavior/Cognition  Alert;Cooperative;Pleasant mood    Oral Cavity Assessment  Within Functional Limits    Oral Care Completed by SLP  No    Oral Cavity - Dentition  Adequate natural dentition;Dentures, top   missing some molars   Vision  Functional for self feeding    Self-Feeding Abilities  Able to feed self    Patient Positioning  Upright in chair    Baseline Vocal Quality  Normal;Low vocal intensity    Volitional Cough  Strong    Volitional Swallow  Able to elicit    Anatomy  Within functional limits    Pharyngeal Secretions  Not observed secondary MBS         Oral Preparation/Oral Phase - 02/13/18 1441      Oral Preparation/Oral Phase   Oral  Phase  Impaired      Oral - Thin   Oral - Thin Cup  Within functional limits   premature spillage     Electrical stimulation - Oral Phase   Was Electrical Stimulation Used  No       Pharyngeal Phase - 02/13/18 1442      Pharyngeal Phase   Pharyngeal Phase  Impaired      Pharyngeal - Thin   Pharyngeal- Thin Teaspoon  Swallow initiation at vallecula;Pharyngeal residue - pyriform   trace pyriform residue   Pharyngeal- Thin Cup  Swallow initiation at vallecula;Swallow initiation at pyriform sinus;Penetration/Aspiration during swallow;Pharyngeal residue - pyriform   trace pyriform residue   Pharyngeal  Material enters airway, remains ABOVE vocal cords and not ejected out;Material does not enter airway    Pharyngeal- Thin Straw  Swallow initiation at pyriform sinus;Penetration/Aspiration during swallow;Pharyngeal residue - pyriform    Pharyngeal  Material does not enter  airway;Material enters airway, remains ABOVE vocal cords then ejected out      Pharyngeal - Solids   Pharyngeal- Puree  Swallow initiation at vallecula;Lateral channel residue;Compensatory strategies attempted (with notebox)   mild residuals clear with repeat swallow/ effortful swallow   Pharyngeal- Regular  Delayed swallow initiation-vallecula;Lateral channel residue    Pharyngeal- Pill  Within functional limits   flash penetration of thins during the swallow     Electrical Stimulation - Pharyngeal Phase   Was Electrical Stimulation Used  No       Cricopharyngeal Phase - 02/13/18 1446      Cervical Esophageal Phase   Cervical Esophageal Phase  Impaired      Cervical Esophageal Phase - Thin   Thin Cup  Prominent cricopharyngeal segment        Plan - 02/13/18 1448    Clinical Impression Statement  Pt presents with mild oropharyngeal phase dysphagia characterized by min premature spillage with liquids with swallow trigger at the level of the valleculae and occasionally at the pyriforms with large cup sips and straw sips thin which results in penetration (underepiglottic coating) not entirely removed after first swallow. No penetration to the vocal folds or aspiration observed as Pt with good laryngeal closure. Pt's primary complaint at home was with solid foods. Pt demonstrated mild decreased tongue base retraction vs decreased lateral pharyngeal squeeze which resulted in mild lateral channel and pyriform residue post swallow, however this cleared with repeat/dry swallow. Pt also noted to have slight prominence in cricopharyngeus which may be indicative of esophageal dysphagia (reflux vs dysmotility). Esophageal sweep completed post swallow of barium tablet with thin and revealed mild retention of barium in esophagus and the pill appeared to move to the stomach in a timely fashion. Pt benefited from effortful swallow and repeat/dry swallow with bites of solid. Imaging from the study was  reviewed at length with the Pt and his wife and daughter. Recommend regular textures with chopped meats and soak cornbread in liquids, take small sips, and implement effortful swallow with solids. Pt provided with written recommendations and two swallowing exercises (effortful and chin tuck against resistance to do while watching TV etc). Pt was able to return demonstrate and family present for instruction. No additional SLP services indicated at this time. Pt/family given my contact information should they have further questions or concerns.    Treatment/Interventions  SLP instruction and feedback;Patient/family education;Pharyngeal strengthening exercises    Potential to Achieve Goals  Good    Potential Considerations  Ability to learn/carryover information    Consulted and  Agree with Plan of Care  Patient;Family member/caregiver    Family Member Consulted  spouse and daughter       Patient will benefit from skilled therapeutic intervention in order to improve the following deficits and impairments:   Dysphagia, oropharyngeal phase    Recommendations/Treatment - 02/13/18 1447      Swallow Evaluation Recommendations   SLP Diet Recommendations  Age appropriate regular;Dysphagia 3 (mechanical soft);Thin    Liquid Administration via  Cup    Medication Administration  Whole meds with liquid    Supervision  Patient able to self feed    Compensations  Small sips/bites;Multiple dry swallows after each bite/sip;Effortful swallow    Postural Changes  Seated upright at 90 degrees;Remain upright for at least 30 minutes after feeds/meals       Prognosis - 02/13/18 1447      Prognosis   Prognosis for Safe Diet Advancement  Good      Individuals Consulted   Consulted and Agree with Results and Recommendations  Patient;Family member/caregiver    Report Sent to   Referring physician       Problem List Patient Active Problem List   Diagnosis Date Noted  . Memory disturbance 01/31/2018  .  Constipation 11/24/2017  . Change in bowel function 11/24/2017  . OSA (obstructive sleep apnea) 09/19/2017  . Overweight (BMI 25.0-29.9) 05/19/2017  . Prediabetes 10/22/2016  . HTN (hypertension) 08/17/2012  . HLD (hyperlipidemia) 08/17/2012  . Hypothyroid 08/17/2012  . Seasonal allergic rhinitis 08/17/2012  . 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 Osteoporosis 08/17/2012   Thank you,  Genene Churn, [place]  [facility] 02/13/2018, 2:53 PM  [facility] [address] [place], [place], [address] Phone: [phone]   Fax:  [phone]  Name: KAYSHAUN POLANCO MRN: [id number] Date of Birth: [birth year]

## 2018-02-20 ENCOUNTER — Ambulatory Visit (INDEPENDENT_AMBULATORY_CARE_PROVIDER_SITE_OTHER): Payer: Medicare Other | Admitting: *Deleted

## 2018-02-20 DIAGNOSIS — E538 Deficiency of other specified B group vitamins: Secondary | ICD-10-CM | POA: Diagnosis not present

## 2018-02-20 NOTE — Progress Notes (Signed)
Pt given Cyanocobalamin inj Tolerated well 

## 2018-02-28 DIAGNOSIS — B351 Tinea unguium: Secondary | ICD-10-CM | POA: Diagnosis not present

## 2018-02-28 DIAGNOSIS — M79676 Pain in unspecified toe(s): Secondary | ICD-10-CM | POA: Diagnosis not present

## 2018-03-02 NOTE — Telephone Encounter (Signed)
Okay to discontinue CPAP since he has not tolerated. He had positional OSA so should avoid sleeping in the supine position.  If necessary he can use a device such as zzoma  (can obtain from zzoma.com)  I cannot take the diagnosis of this medical record however. Ability to drive or not is something that I would defer to his PCP/neurologist.   Advised against medications with sedative side effects Cautioned against driving when sleepy - understanding that sleepiness will vary on a day to day basis

## 2018-03-02 NOTE — Telephone Encounter (Signed)
Dr Elsworth Soho,   Patients Daughter sent you this message.  This is James Thomas. My dad has tried the CPAP since February 8. It is difficult for him to use especially when he gets up a couple of times a night to go to the restroom. He is actually awake during the night more than he was before the machine. He made the decision yesterday that he no longer wants to try it and you and I both told him at the visit if he made that decision we would honor it. I know that you will need to take the issue out of his record or it will cause him an issue with his driver's license. Please advise what we need to do. I am contacting the equipment group to return the machine.

## 2018-03-06 ENCOUNTER — Ambulatory Visit (INDEPENDENT_AMBULATORY_CARE_PROVIDER_SITE_OTHER): Payer: Medicare Other | Admitting: *Deleted

## 2018-03-06 DIAGNOSIS — E538 Deficiency of other specified B group vitamins: Secondary | ICD-10-CM

## 2018-03-06 NOTE — Progress Notes (Signed)
Pt given Cyanocobalamin inj Tolerated well 

## 2018-03-20 ENCOUNTER — Ambulatory Visit (INDEPENDENT_AMBULATORY_CARE_PROVIDER_SITE_OTHER): Payer: Medicare Other | Admitting: *Deleted

## 2018-03-20 ENCOUNTER — Other Ambulatory Visit: Payer: Self-pay

## 2018-03-20 DIAGNOSIS — E538 Deficiency of other specified B group vitamins: Secondary | ICD-10-CM | POA: Diagnosis not present

## 2018-03-20 NOTE — Progress Notes (Signed)
Pt given Cyanocobalamin inj Tolerated well 

## 2018-03-22 DIAGNOSIS — M1711 Unilateral primary osteoarthritis, right knee: Secondary | ICD-10-CM | POA: Diagnosis not present

## 2018-03-22 DIAGNOSIS — M170 Bilateral primary osteoarthritis of knee: Secondary | ICD-10-CM | POA: Diagnosis not present

## 2018-03-22 DIAGNOSIS — M1712 Unilateral primary osteoarthritis, left knee: Secondary | ICD-10-CM | POA: Diagnosis not present

## 2018-03-22 DIAGNOSIS — M25561 Pain in right knee: Secondary | ICD-10-CM | POA: Diagnosis not present

## 2018-03-31 ENCOUNTER — Other Ambulatory Visit: Payer: Self-pay

## 2018-04-03 ENCOUNTER — Ambulatory Visit: Payer: Medicare Other

## 2018-04-05 ENCOUNTER — Other Ambulatory Visit: Payer: Self-pay | Admitting: *Deleted

## 2018-04-05 ENCOUNTER — Encounter: Payer: Self-pay | Admitting: Family Medicine

## 2018-04-05 MED ORDER — "SYRINGE/NEEDLE (DISP) 25G X 1"" 3 ML MISC": 1 refills | Status: DC

## 2018-04-05 MED ORDER — CYANOCOBALAMIN 1000 MCG/ML IJ SOLN: 1000.0000 ug | INTRAMUSCULAR | 3 refills | Status: DC

## 2018-04-19 ENCOUNTER — Encounter: Payer: Self-pay | Admitting: Family Medicine

## 2018-04-21 MED ORDER — CYANOCOBALAMIN 1000 MCG/ML IJ SOLN: 1000.0000 ug | INTRAMUSCULAR | 0 refills | Status: DC

## 2018-06-07 ENCOUNTER — Other Ambulatory Visit: Payer: Self-pay | Admitting: Family Medicine

## 2018-06-27 DIAGNOSIS — L600 Ingrowing nail: Secondary | ICD-10-CM | POA: Diagnosis not present

## 2018-06-27 DIAGNOSIS — L03032 Cellulitis of left toe: Secondary | ICD-10-CM | POA: Diagnosis not present

## 2018-06-27 DIAGNOSIS — L03031 Cellulitis of right toe: Secondary | ICD-10-CM | POA: Diagnosis not present

## 2018-07-11 DIAGNOSIS — L03031 Cellulitis of right toe: Secondary | ICD-10-CM | POA: Diagnosis not present

## 2018-07-11 DIAGNOSIS — L03032 Cellulitis of left toe: Secondary | ICD-10-CM | POA: Diagnosis not present

## 2018-07-20 ENCOUNTER — Encounter: Payer: Self-pay | Admitting: Family Medicine

## 2018-07-24 ENCOUNTER — Other Ambulatory Visit: Payer: Self-pay

## 2018-07-24 ENCOUNTER — Other Ambulatory Visit: Payer: Self-pay | Admitting: Family Medicine

## 2018-07-25 ENCOUNTER — Encounter: Payer: Self-pay | Admitting: Family Medicine

## 2018-07-25 ENCOUNTER — Ambulatory Visit (INDEPENDENT_AMBULATORY_CARE_PROVIDER_SITE_OTHER): Payer: Medicare Other | Admitting: Family Medicine

## 2018-07-25 VITALS — BP 139/80 | HR 70 | Temp 97.3°F | Ht 73.0 in | Wt 188.2 lb

## 2018-07-25 DIAGNOSIS — E782 Mixed hyperlipidemia: Secondary | ICD-10-CM | POA: Diagnosis not present

## 2018-07-25 DIAGNOSIS — I10 Essential (primary) hypertension: Secondary | ICD-10-CM | POA: Diagnosis not present

## 2018-07-25 DIAGNOSIS — R7303 Prediabetes: Secondary | ICD-10-CM

## 2018-07-25 DIAGNOSIS — E039 Hypothyroidism, unspecified: Secondary | ICD-10-CM | POA: Diagnosis not present

## 2018-07-25 DIAGNOSIS — Z125 Encounter for screening for malignant neoplasm of prostate: Secondary | ICD-10-CM

## 2018-07-25 LAB — BAYER DCA HB A1C WAIVED: HB A1C (BAYER DCA - WAIVED): 6 % (ref <7.0)

## 2018-07-25 NOTE — Progress Notes (Signed)
BP 139/80   Pulse 70   Temp (!) 97.3 F (36.3 C) (Oral)   Ht _0  (1.854 m)   Wt 188 lb 3.2 oz (85.4 kg)   BMI 24.83 kg/m    Subjective:   Patient ID: James Thomas, male    DOB: Jun 01, 1934, 84 y.o.   MRN: [id number]  HPI: James Thomas is a 84 y.o. male presenting on 07/25/2018 for Hypertension (6 month follow up)   HPI Prediabetes Patient comes in today for recheck of his diabetes. Patient has been currently taking no medication currently, we are monitoring his last A1c was 5.6- 6 months ago.. Patient is currently on an ACE inhibitor/ARB. Patient has not seen an ophthalmologist this year. Patient denies any issues with their feet.   Hyperlipidemia Patient is coming in for recheck of his hyperlipidemia. The patient is currently taking omega-3's. They deny any issues with myalgias or history of liver damage from it. They deny any focal numbness or weakness or chest pain.   Hypertension Patient is currently on amlodipine and losartan, and their blood pressure today is 139/80. Patient denies any lightheadedness or dizziness. Patient denies headaches, blurred vision, chest pains, shortness of breath, or weakness. Denies any side effects from medication and is content with current medication.   Hypothyroidism recheck Patient is coming in for thyroid recheck today as well. They deny any issues with hair changes or heat or cold problems or diarrhea or constipation. They deny any chest pain or palpitations. They are currently on levothyroxine 48mcrograms   Relevant past medical, surgical, family and social history reviewed and updated as indicated. Interim medical history since our last visit reviewed. Allergies and medications reviewed and updated.  Review of Systems  Constitutional: Negative for chills and fever.  Eyes: Negative for visual disturbance.  Respiratory: Negative for shortness of breath and wheezing.   Cardiovascular: Negative for chest pain and leg swelling.   Musculoskeletal: Negative for back pain and gait problem.  Skin: Negative for rash.  Neurological: Negative for dizziness, weakness and light-headedness.  All other systems reviewed and are negative.   Per HPI unless specifically indicated above   Allergies as of 07/25/2018      Reactions   Fenofibrate Cough   Meloxicam    Unsure but thinks it gave him a rash   Niacin And Related Itching, Rash      Medication List       Accurate as of July 25, 2018  8:49 AM. If you have any questions, ask your nurse or doctor.        STOP taking these medications   docusate sodium 100 MG capsule Commonly known as: COLACE Stopped by: JWorthy Rancher MD   PRESCRIPTION MEDICATION Stopped by: JFransisca KaufmannDettinger, MD   SYRINGE-NEEDLE (DISP) 3 ML 25G X 1" 3 ML Misc Stopped by: JFransisca KaufmannDettinger, MD     TAKE these medications   amLODipine 5 MG tablet Commonly known as: NORVASC Take 1.5 tablets (7.5 mg total) by mouth daily.   CENTRUM SILVER 50+MEN PO Take 1 tablet by mouth daily.   cholecalciferol 1000 units tablet Commonly known as: VITAMIN D Take 2,000 Units by mouth daily.   CITRACAL + D PO Take by mouth daily.   ELDERBERRY PO Take by mouth daily.   Fish Oil 1200 MG Caps Take 2 capsules by mouth daily.   levothyroxine 75 MCG tablet Commonly known as: SYNTHROID Take 1 tablet (75 mcg total) by mouth daily.  losartan 50 MG tablet Commonly known as: COZAAR Take 50 mg by mouth daily. What changed: Another medication with the same name was removed. Continue taking this medication, and follow the directions you see here. Changed by: Fransisca Kaufmann Dettinger, MD   polyethylene glycol 17 g packet Commonly known as: MIRALAX / GLYCOLAX Take 17 g by mouth at bedtime.   vitamin B-12 500 MCG tablet Commonly known as: CYANOCOBALAMIN Take 500 mcg by mouth daily. What changed: Another medication with the same name was removed. Continue taking this medication, and follow the  directions you see here. Changed by: Fransisca Kaufmann Dettinger, MD        Objective:   BP 139/80   Pulse 70   Temp (!) 97.3 F (36.3 C) (Oral)   Ht _0  (1.854 m)   Wt 188 lb 3.2 oz (85.4 kg)   BMI 24.83 kg/m   Wt Readings from Last 3 Encounters:  07/25/18 188 lb 3.2 oz (85.4 kg)  02/02/18 186 lb (84.4 kg)  01/31/18 184 lb (83.5 kg)    Physical Exam Vitals signs and nursing note reviewed.  Constitutional:      General: He is not in acute distress.    Appearance: He is well-developed. He is not diaphoretic.  Eyes:     General: No scleral icterus.    Conjunctiva/sclera: Conjunctivae normal.  Neck:     Musculoskeletal: Neck supple.     Thyroid: No thyromegaly.  Cardiovascular:     Rate and Rhythm: Normal rate and regular rhythm.     Heart sounds: Normal heart sounds. No murmur.  Pulmonary:     Effort: Pulmonary effort is normal. No respiratory distress.     Breath sounds: Normal breath sounds. No wheezing.  Lymphadenopathy:     Cervical: No cervical adenopathy.  Skin:    General: Skin is warm and dry.     Findings: No rash.  Neurological:     Mental Status: He is alert and oriented to person, place, and time.     Coordination: Coordination normal.  Psychiatric:        Behavior: Behavior normal.       Assessment & Plan:   Problem List Items Addressed This Visit      Cardiovascular and Mediastinum   HTN (hypertension) - Primary   Relevant Medications   losartan (COZAAR) 50 MG tablet   Other Relevant Orders   CMP14+EGFR     Endocrine   Hypothyroid   Relevant Orders   TSH     Other   HLD (hyperlipidemia)   Relevant Medications   losartan (COZAAR) 50 MG tablet   Other Relevant Orders   Lipid panel   Prediabetes   Relevant Orders   Bayer DCA Hb A1c Waived    Other Visit Diagnoses    Prostate cancer screening       Relevant Orders   PSA, total and free      Continue current medication, no change, will see back in 6 months Follow up plan: Return  in about 6 months (around 01/25/2019), or if symptoms worsen or fail to improve, for Thyroid and prediabetes hypertension.  Counseling provided for all of the vaccine components Orders Placed This Encounter  Procedures  . Bayer DCA Hb A1c Waived  . CMP14+EGFR  . TSH  . PSA, total and free  . Lipid panel    Caryl Pina, MD [facility] 07/25/2018, 8:49 AM

## 2018-07-26 LAB — CMP14+EGFR
ALT: 11 IU/L (ref 0–44)
AST: 17 IU/L (ref 0–40)
Albumin/Globulin Ratio: 2 (ref 1.2–2.2)
Albumin: 4.5 g/dL (ref 3.6–4.6)
Alkaline Phosphatase: 100 IU/L (ref 39–117)
BUN/Creatinine Ratio: 8 — ABNORMAL LOW (ref 10–24)
BUN: 9 mg/dL (ref 8–27)
Bilirubin Total: 0.8 mg/dL (ref 0.0–1.2)
CO2: 27 mmol/L (ref 20–29)
Calcium: 10.2 mg/dL (ref 8.6–10.2)
Chloride: 102 mmol/L (ref 96–106)
Creatinine, Ser: 1.2 mg/dL (ref 0.76–1.27)
GFR calc Af Amer: 65 mL/min/{1.73_m2} (ref >59)
GFR calc non Af Amer: 56 mL/min/{1.73_m2} — ABNORMAL LOW (ref >59)
Globulin, Total: 2.3 g/dL (ref 1.5–4.5)
Glucose: 130 mg/dL — ABNORMAL HIGH (ref 65–99)
Potassium: 4.1 mmol/L (ref 3.5–5.2)
Sodium: 143 mmol/L (ref 134–144)
Total Protein: 6.8 g/dL (ref 6.0–8.5)

## 2018-07-26 LAB — PSA, TOTAL AND FREE
PSA, Free Pct: 27.3 %
PSA, Free: 0.3 ng/mL
Prostate Specific Ag, Serum: 1.1 ng/mL (ref 0.0–4.0)

## 2018-07-26 LAB — LIPID PANEL
Chol/HDL Ratio: 7.1 ratio — ABNORMAL HIGH (ref 0.0–5.0)
Cholesterol, Total: 207 mg/dL — ABNORMAL HIGH (ref 100–199)
HDL: 29 mg/dL — ABNORMAL LOW (ref >39)
LDL Calculated: 126 mg/dL — ABNORMAL HIGH (ref 0–99)
Triglycerides: 258 mg/dL — ABNORMAL HIGH (ref 0–149)
VLDL Cholesterol Cal: 52 mg/dL — ABNORMAL HIGH (ref 5–40)

## 2018-07-26 LAB — TSH: TSH: 2.28 u[IU]/mL (ref 0.450–4.500)

## 2018-07-26 MED ORDER — PRAVASTATIN SODIUM 20 MG PO TABS: 20.0000 mg | ORAL_TABLET | Freq: Every day | ORAL | 3 refills | Status: DC

## 2018-07-26 NOTE — Addendum Note (Signed)
Addended by: Karle Plumber on: 07/26/2018 03:31 PM   Modules accepted: Orders

## 2018-08-01 ENCOUNTER — Ambulatory Visit: Payer: Medicare Other | Admitting: Neurology

## 2018-08-30 ENCOUNTER — Encounter: Payer: Self-pay | Admitting: Family Medicine

## 2018-08-30 DIAGNOSIS — M170 Bilateral primary osteoarthritis of knee: Secondary | ICD-10-CM | POA: Diagnosis not present

## 2018-08-30 MED ORDER — CYANOCOBALAMIN 1000 MCG/ML IJ SOLN: 1000.0000 ug | INTRAMUSCULAR | 11 refills | Status: DC

## 2018-09-19 DIAGNOSIS — M79676 Pain in unspecified toe(s): Secondary | ICD-10-CM | POA: Diagnosis not present

## 2018-09-19 DIAGNOSIS — B351 Tinea unguium: Secondary | ICD-10-CM | POA: Diagnosis not present

## 2018-09-24 ENCOUNTER — Encounter: Payer: Self-pay | Admitting: Family Medicine

## 2018-09-25 MED ORDER — LOSARTAN POTASSIUM 50 MG PO TABS: 50.0000 mg | ORAL_TABLET | Freq: Every day | ORAL | 1 refills | Status: DC

## 2018-10-12 DIAGNOSIS — D1801 Hemangioma of skin and subcutaneous tissue: Secondary | ICD-10-CM | POA: Diagnosis not present

## 2018-10-12 DIAGNOSIS — L570 Actinic keratosis: Secondary | ICD-10-CM | POA: Diagnosis not present

## 2018-10-12 DIAGNOSIS — L821 Other seborrheic keratosis: Secondary | ICD-10-CM | POA: Diagnosis not present

## 2018-10-12 DIAGNOSIS — Z85820 Personal history of malignant melanoma of skin: Secondary | ICD-10-CM | POA: Diagnosis not present

## 2018-11-06 DIAGNOSIS — Z23 Encounter for immunization: Secondary | ICD-10-CM | POA: Diagnosis not present

## 2018-11-16 IMAGING — CR DG ORBITS FOR FOREIGN BODY
2 series · 2 of 2 positions shown · non-contrast
Comparison: None.

CLINICAL DATA: Metal working/exposure; clearance prior to MRI

EXAM:
ORBITS FOR FOREIGN BODY - 2 VIEW

[w orbit pa (1 of 2)]
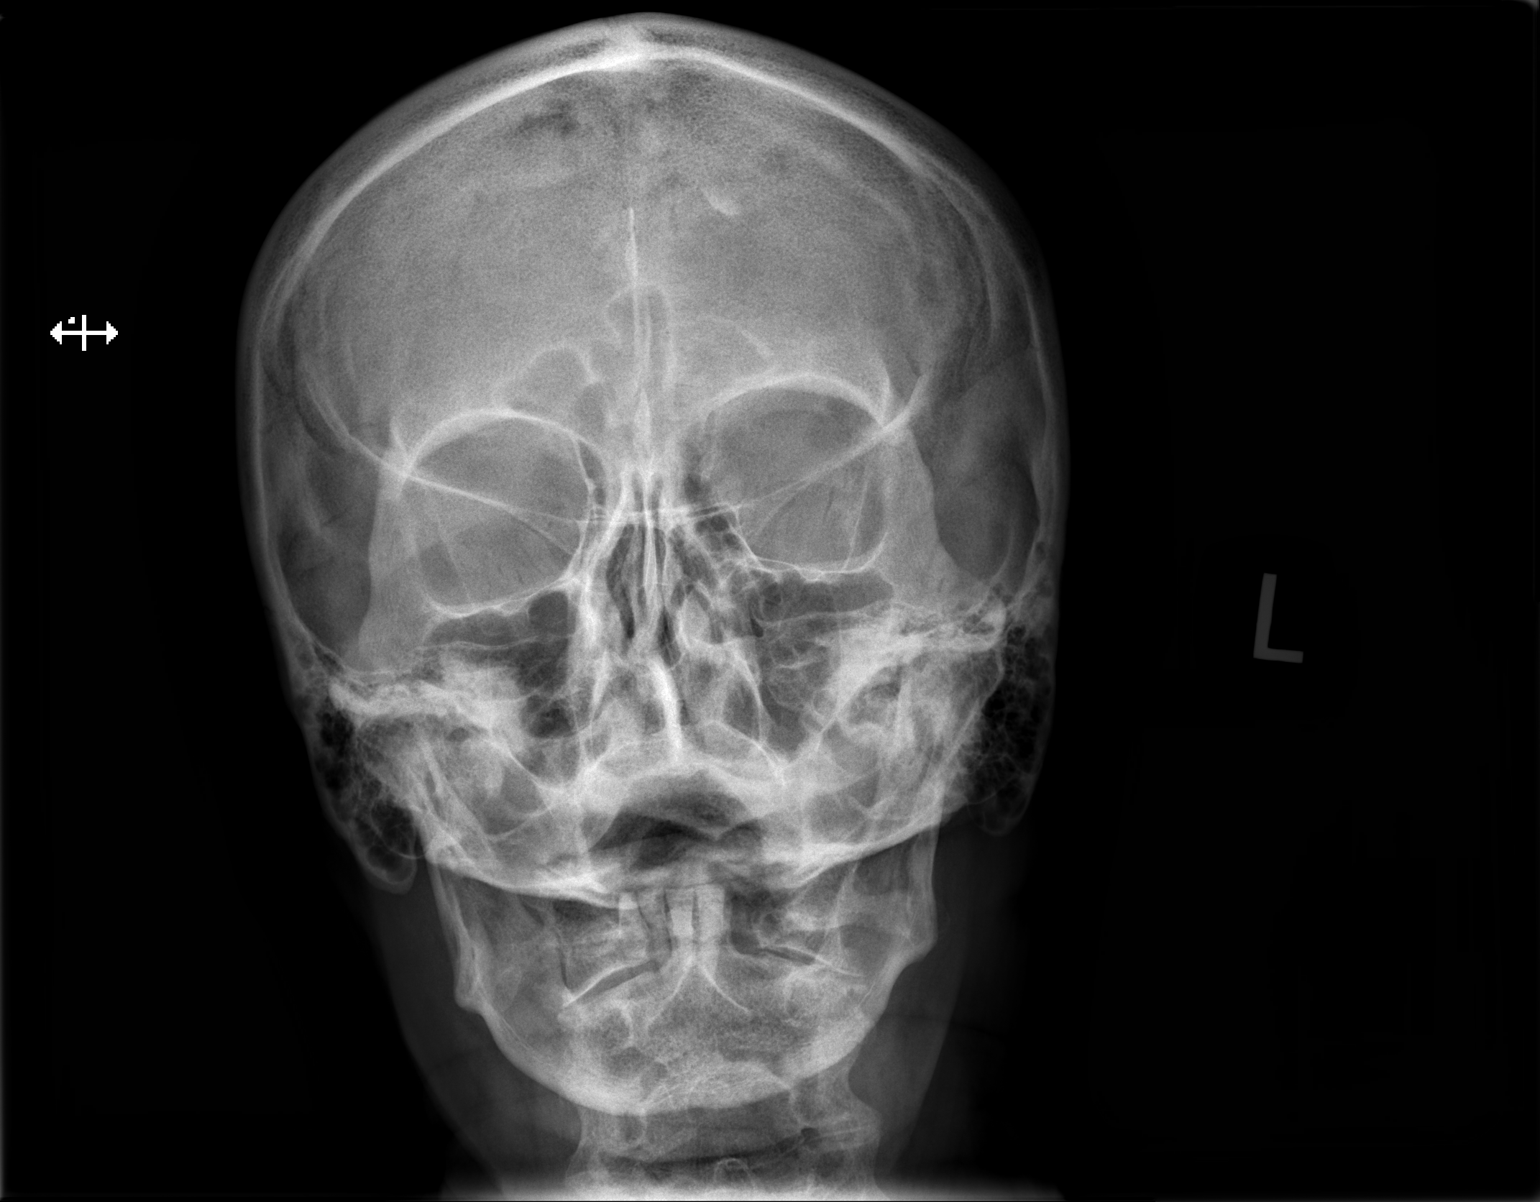

[w orbit pa (2 of 2)]
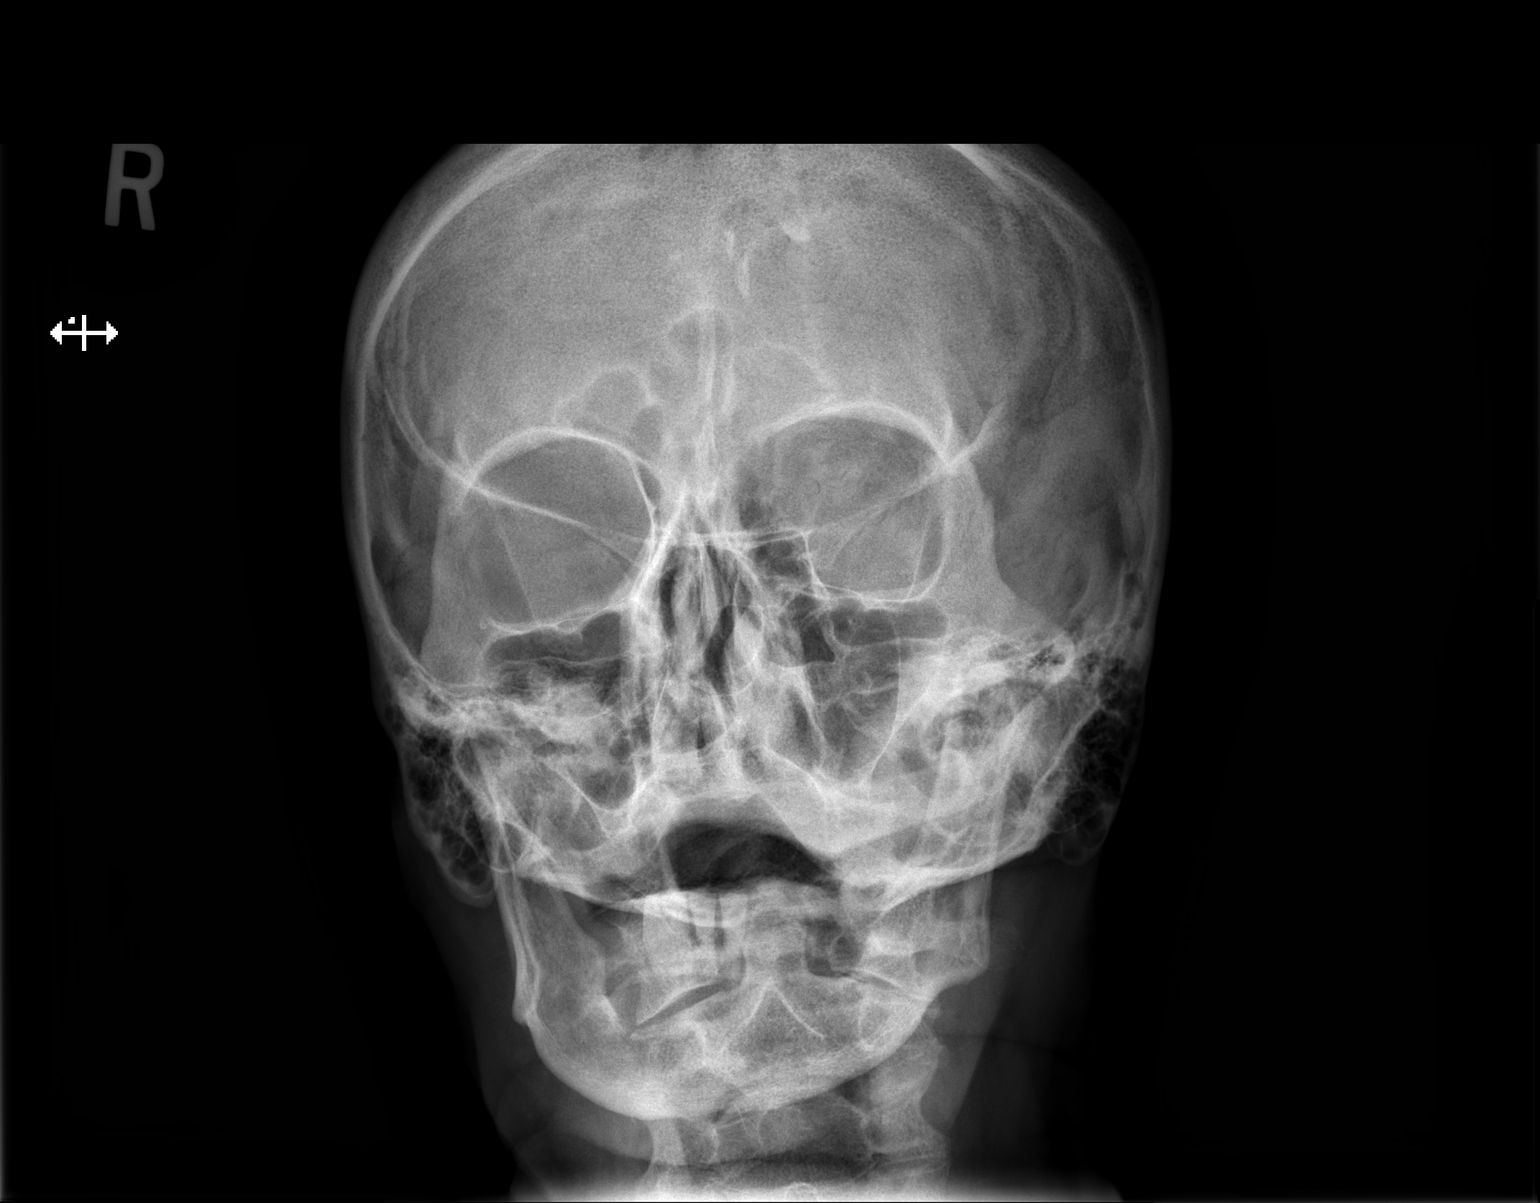

[2 of 2 positions shown; findings below may reference images not displayed]

FINDINGS: There is no evidence of metallic foreign body within the orbits. No
significant bone abnormality identified.
IMPRESSION: No evidence of metallic foreign body within the orbits.

## 2018-11-16 IMAGING — MR MR HEAD W/O CM
10 series · 48 of 48 positions shown · non-contrast
Comparison: None.

CLINICAL DATA: Mild cognitive impairment

EXAM:
MRI HEAD WITHOUT CONTRAST
TECHNIQUE: Multiplanar, multiecho pulse sequences of the brain and surrounding
structures were obtained without intravenous contrast.

[Series 2: t1_se_sag · sagittal · 5.0mm · 0.45mm/px · 1 of 21 slices shown]
[im 1/21]
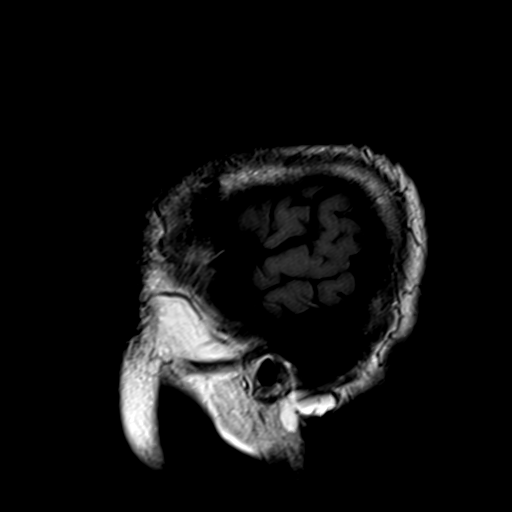

[Series 3: t2_tse_tra_512 · axial · 5.0mm · 0.60mm/px · z∈[-85,+70]mm · 2 of 27 slices shown]
[im 1/27]
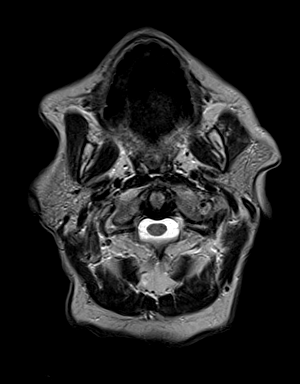
[im 27/27]
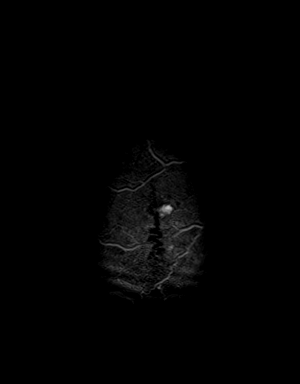

[Series 4: ep2d_diff_(id)_trace · axial · 3.0mm · 1.80mm/px · z∈[-89,+55]mm · 9 of 95 slices shown]
[im 1/95]
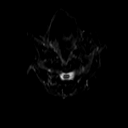
[im 12/95]
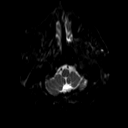
[im 24/95]
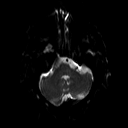
[im 36/95]
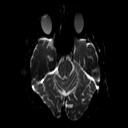
[im 48/95]
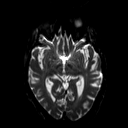
[im 59/95]
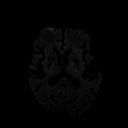
[im 71/95]
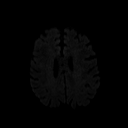
[im 83/95]
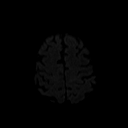
[im 95/95]
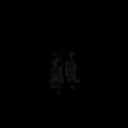

[Series 5: ep2d_diff_(id)_trace_adc · axial · 3.0mm · 1.80mm/px · z∈[-89,+55]mm · 5 of 49 slices shown]
[im 1/49]
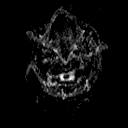
[im 13/49]
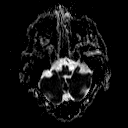
[im 25/49]
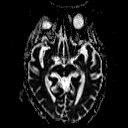
[im 37/49]
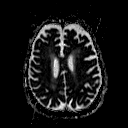
[im 49/49]
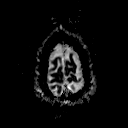

[Series 6: ep2d_diff_cor · coronal · 5.0mm · 1.77mm/px · 5 of 54 slices shown]
[im 1/54]
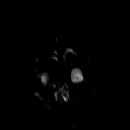
[im 14/54]
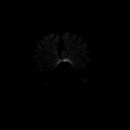
[im 27/54]
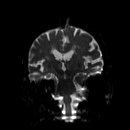
[im 40/54]
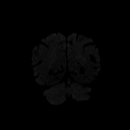
[im 54/54]
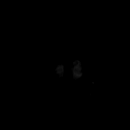

[Series 7: ep2d_diff_cor_adc · coronal · 5.0mm · 1.77mm/px · 3 of 27 slices shown]
[im 1/27]
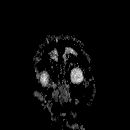
[im 14/27]
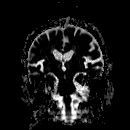
[im 27/27]
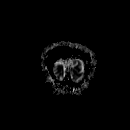

[Series 9: swi_images · axial · 4.0mm · 0.90mm/px · z∈[-77,+62]mm · 3 of 36 slices shown]
[im 1/36]
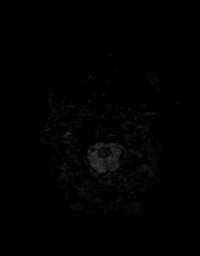
[im 18/36]
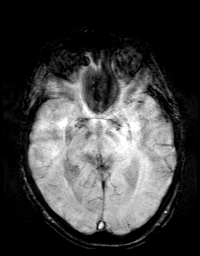
[im 36/36]
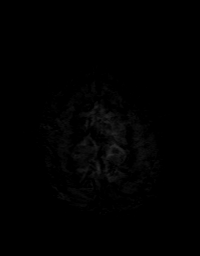

[Series 10: FLAIR · axial · 3.0mm · 0.43mm/px · z∈[-79,+60]mm · 3 of 30 slices shown]
[im 1/30]
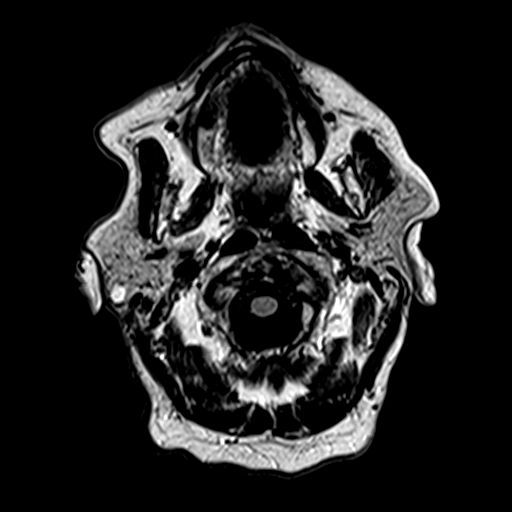
[im 15/30]
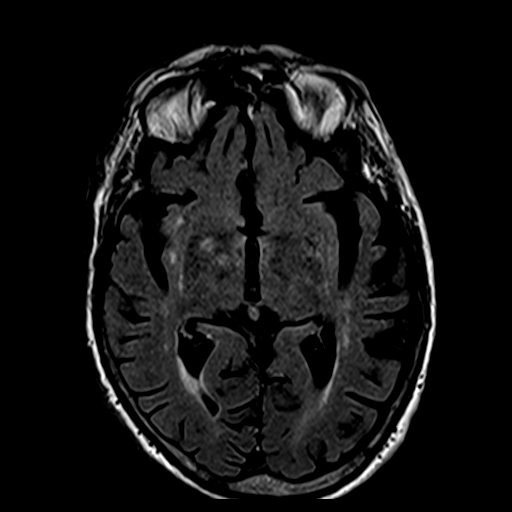
[im 30/30]
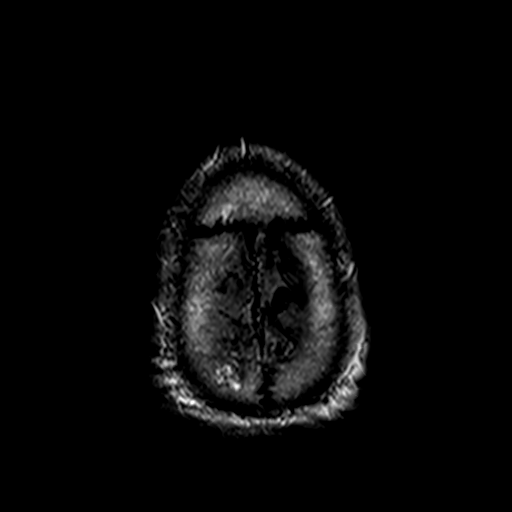

[Series 11: t1_mpr_tra · axial · 1.0mm · 0.72mm/px · z∈[-82,+60]mm · 14 of 144 slices shown]
[im 1/144]
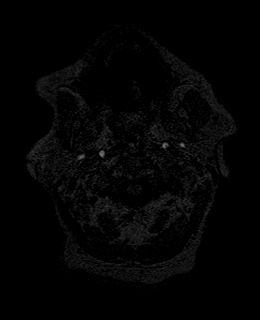
[im 12/144]
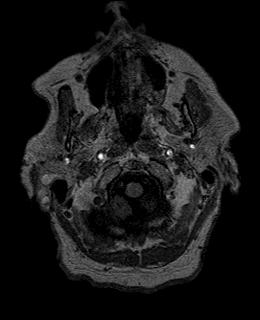
[im 23/144]
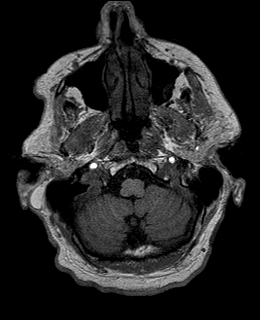
[im 34/144]
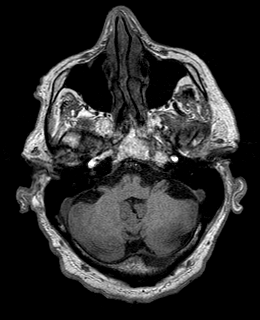
[im 45/144]
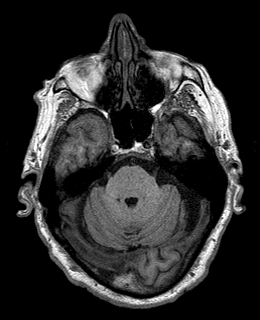
[im 56/144]
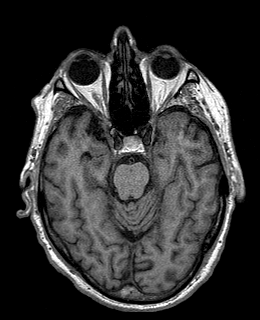
[im 67/144]
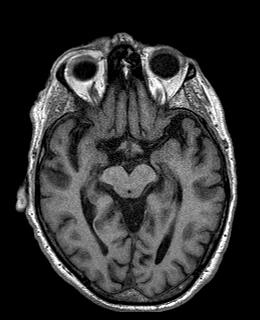
[im 78/144]
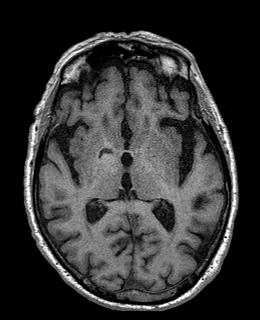
[im 89/144]
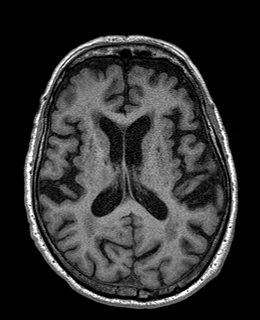
[im 100/144]
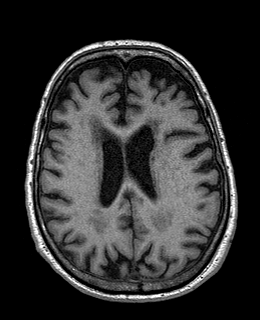
[im 111/144]
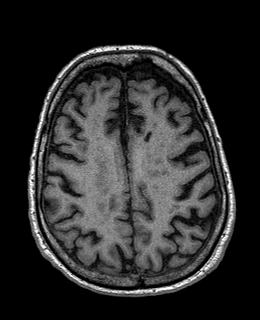
[im 122/144]
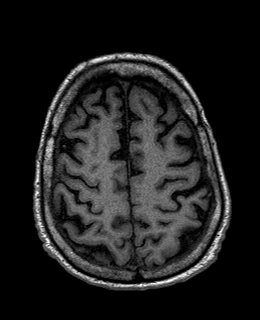
[im 133/144]
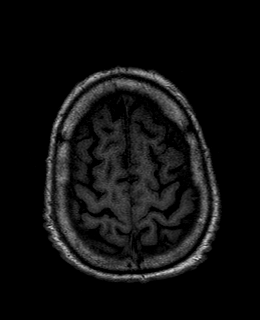
[im 144/144]
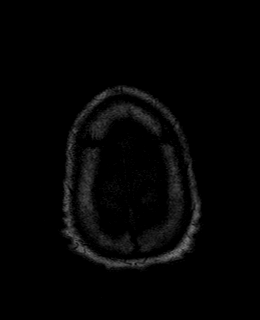

[Series 12: T2 · coronal · 5.0mm · 0.45mm/px · 3 of 28 slices shown]
[im 1/28]
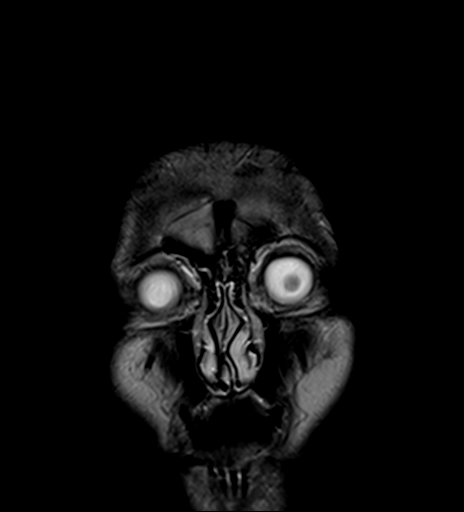
[im 14/28]
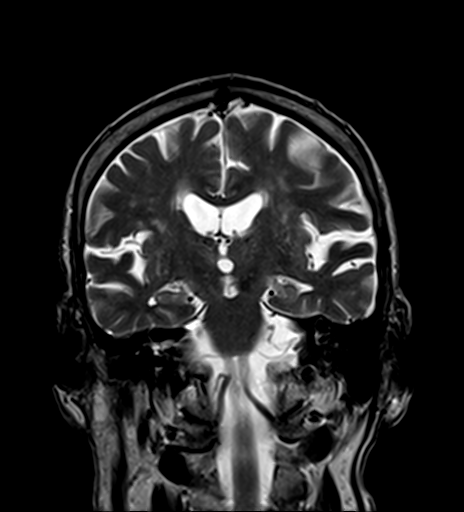
[im 28/28]
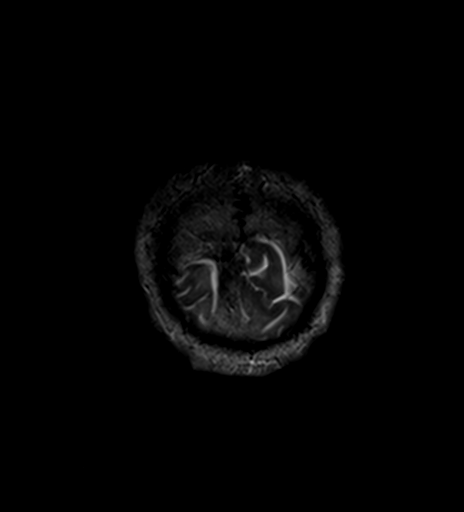

[48 of 48 positions shown; findings below may reference images not displayed]

FINDINGS: Brain: Moderate atrophy negative for hydrocephalus.

Moderate chronic microvascular ischemic change in the white matter.
Chronic infarcts in the basal ganglia bilaterally. Brainstem and
cerebellum intact. Negative for hemorrhage mass or edema. Negative
for acute infarct.

Vascular: Normal arterial flow voids

Skull and upper cervical spine: Negative

Sinuses/Orbits: Mild mucosal edema paranasal sinuses. Right cataract
removal

Other: None
IMPRESSION: Moderate atrophy and moderate chronic microvascular ischemic change.
No acute abnormality.

## 2019-01-23 ENCOUNTER — Encounter: Payer: Self-pay | Admitting: Family Medicine

## 2019-01-24 ENCOUNTER — Other Ambulatory Visit: Payer: Self-pay

## 2019-01-25 ENCOUNTER — Encounter: Payer: Self-pay | Admitting: Family Medicine

## 2019-01-25 ENCOUNTER — Ambulatory Visit (INDEPENDENT_AMBULATORY_CARE_PROVIDER_SITE_OTHER): Payer: Medicare Other | Admitting: Family Medicine

## 2019-01-25 VITALS — BP 136/77 | HR 76 | Temp 98.0°F | Ht 73.0 in | Wt 188.0 lb

## 2019-01-25 DIAGNOSIS — E782 Mixed hyperlipidemia: Secondary | ICD-10-CM | POA: Diagnosis not present

## 2019-01-25 DIAGNOSIS — R7303 Prediabetes: Secondary | ICD-10-CM

## 2019-01-25 DIAGNOSIS — I10 Essential (primary) hypertension: Secondary | ICD-10-CM | POA: Diagnosis not present

## 2019-01-25 DIAGNOSIS — E039 Hypothyroidism, unspecified: Secondary | ICD-10-CM | POA: Diagnosis not present

## 2019-01-25 LAB — BAYER DCA HB A1C WAIVED: HB A1C (BAYER DCA - WAIVED): 5.8 % (ref <7.0)

## 2019-01-25 NOTE — Progress Notes (Signed)
BP 136/77   Pulse 76   Temp 98 F (36.7 C) (Temporal)   Ht 6' 1"  (1.854 m)   Wt 188 lb (85.3 kg)   SpO2 98%   BMI 24.80 kg/m    Subjective:   Patient ID: James Thomas, male    DOB: December 27, 1934, 85 y.o.   MRN: [id number]  HPI: James Thomas is a 85 y.o. male presenting on 01/25/2019 for Hypertension (6 month follow up) and prediabetes   HPI Prediabetes Patient comes in today for recheck of his diabetes. Patient has been currently taking no medication and his A1c is 5.8 today and he is diet controlled. Patient is currently on an ACE inhibitor/ARB. Patient has not seen an ophthalmologist this year. Patient denies any issues with their feet.  Hypertension Patient is currently on losartan and amlodipine, and their blood pressure today is 136/77. Patient denies any lightheadedness or dizziness. Patient denies headaches, blurred vision, chest pains, shortness of breath, or weakness. Denies any side effects from medication and is content with current medication.   Hyperlipidemia Patient is coming in for recheck of his hyperlipidemia. The patient is currently taking fish oil and pravastatin. They deny any issues with myalgias or history of liver damage from it. They deny any focal numbness or weakness or chest pain.   Hypothyroidism recheck Patient is coming in for thyroid recheck today as well. They deny any issues with hair changes or heat or cold problems or diarrhea or constipation. They deny any chest pain or palpitations. They are currently on levothyroxine 75 micrograms    Relevant past medical, surgical, family and social history reviewed and updated as indicated. Interim medical history since our last visit reviewed. Allergies and medications reviewed and updated.  Review of Systems  Constitutional: Negative for chills and fever.  Eyes: Negative for discharge.  Respiratory: Negative for shortness of breath and wheezing.   Cardiovascular: Negative for chest pain and leg  swelling.  Musculoskeletal: Negative for back pain and gait problem.  Skin: Negative for rash.  Neurological: Negative for dizziness, weakness and light-headedness.  All other systems reviewed and are negative.   Per HPI unless specifically indicated above   Allergies as of 01/25/2019      Reactions   Fenofibrate Cough   Meloxicam    Unsure but thinks it gave him a rash   Niacin And Related Itching, Rash      Medication List       Accurate as of January 25, 2019  8:15 AM. If you have any questions, ask your nurse or doctor.        STOP taking these medications   ELDERBERRY PO Stopped by: Fransisca Kaufmann Aarushi Hemric, MD     TAKE these medications   amLODipine 5 MG tablet Commonly known as: NORVASC Take 1.5 tablets (7.5 mg total) by mouth daily.   CENTRUM SILVER 50+MEN PO Take 1 tablet by mouth daily.   cholecalciferol 1000 units tablet Commonly known as: VITAMIN D Take 2,000 Units by mouth daily.   CITRACAL + D PO Take by mouth daily.   cyanocobalamin 1000 MCG/ML injection Commonly known as: (VITAMIN B-12) Inject 1 mL (1,000 mcg total) into the muscle every 14 (fourteen) days. What changed: Another medication with the same name was removed. Continue taking this medication, and follow the directions you see here. Changed by: Fransisca Kaufmann Shivangi Lutz, MD   Fish Oil 1200 MG Caps Take 2 capsules by mouth daily.   levothyroxine 75 MCG tablet Commonly known  as: SYNTHROID Take 1 tablet (75 mcg total) by mouth daily.   losartan 100 MG tablet Commonly known as: COZAAR Take 100 mg by mouth daily. What changed: Another medication with the same name was removed. Continue taking this medication, and follow the directions you see here. Changed by: Fransisca Kaufmann Flavio Lindroth, MD   polyethylene glycol 17 g packet Commonly known as: MIRALAX / GLYCOLAX Take 17 g by mouth at bedtime.   pravastatin 20 MG tablet Commonly known as: PRAVACHOL Take 1 tablet (20 mg total) by mouth daily.         Objective:   BP 136/77   Pulse 76   Temp 98 F (36.7 C) (Temporal)   Ht 6' 1"  (1.854 m)   Wt 188 lb (85.3 kg)   SpO2 98%   BMI 24.80 kg/m   Wt Readings from Last 3 Encounters:  01/25/19 188 lb (85.3 kg)  07/25/18 188 lb 3.2 oz (85.4 kg)  02/02/18 186 lb (84.4 kg)    Physical Exam Vitals and nursing note reviewed.  Constitutional:      General: He is not in acute distress.    Appearance: He is well-developed. He is not diaphoretic.  Eyes:     General: No scleral icterus.    Conjunctiva/sclera: Conjunctivae normal.  Neck:     Thyroid: No thyromegaly.  Cardiovascular:     Rate and Rhythm: Normal rate and regular rhythm.     Heart sounds: Normal heart sounds. No murmur.  Pulmonary:     Effort: Pulmonary effort is normal. No respiratory distress.     Breath sounds: Normal breath sounds. No wheezing.  Musculoskeletal:        General: Normal range of motion.     Cervical back: Neck supple.  Lymphadenopathy:     Cervical: No cervical adenopathy.  Skin:    General: Skin is warm and dry.     Findings: No rash.  Neurological:     Mental Status: He is alert and oriented to person, place, and time.     Coordination: Coordination normal.  Psychiatric:        Behavior: Behavior normal.       Assessment & Plan:   Problem List Items Addressed This Visit      Cardiovascular and Mediastinum   HTN (hypertension)   Relevant Medications   losartan (COZAAR) 100 MG tablet   Other Relevant Orders   CMP14+EGFR     Endocrine   Hypothyroid   Relevant Orders   CBC with Differential/Platelet   TSH     Other   HLD (hyperlipidemia)   Relevant Medications   losartan (COZAAR) 100 MG tablet   Other Relevant Orders   Lipid panel   Prediabetes - Primary   Relevant Orders   Bayer DCA Hb A1c Waived      Patient stable on his medicines, no changes, A1c is 5.8.  We will continue current medication. Follow up plan: Return in about 6 months (around 07/25/2019), or if  symptoms worsen or fail to improve, for Hypertension and prediabetes and cholesterol.  Counseling provided for all of the vaccine components Orders Placed This Encounter  Procedures  . Bayer DCA Hb A1c Waived  . CBC with Differential/Platelet  . CMP14+EGFR  . Lipid panel  . TSH    Caryl Pina, MD [facility] 01/25/2019, 8:15 AM

## 2019-01-26 LAB — CBC WITH DIFFERENTIAL/PLATELET
Basophils Absolute: 0.1 10*3/uL (ref 0.0–0.2)
Basos: 1 %
EOS (ABSOLUTE): 0.3 10*3/uL (ref 0.0–0.4)
Eos: 4 %
Hematocrit: 42 % (ref 37.5–51.0)
Hemoglobin: 14.2 g/dL (ref 13.0–17.7)
Immature Grans (Abs): 0 10*3/uL (ref 0.0–0.1)
Immature Granulocytes: 0 %
Lymphocytes Absolute: 1.3 10*3/uL (ref 0.7–3.1)
Lymphs: 17 %
MCH: 31.4 pg (ref 26.6–33.0)
MCHC: 33.8 g/dL (ref 31.5–35.7)
MCV: 93 fL (ref 79–97)
Monocytes Absolute: 0.5 10*3/uL (ref 0.1–0.9)
Monocytes: 7 %
Neutrophils Absolute: 5.4 10*3/uL (ref 1.4–7.0)
Neutrophils: 71 %
Platelets: 302 10*3/uL (ref 150–450)
RBC: 4.52 x10E6/uL (ref 4.14–5.80)
RDW: 13 % (ref 11.6–15.4)
WBC: 7.6 10*3/uL (ref 3.4–10.8)

## 2019-01-26 LAB — LIPID PANEL
Chol/HDL Ratio: 4.6 ratio (ref 0.0–5.0)
Cholesterol, Total: 157 mg/dL (ref 100–199)
HDL: 34 mg/dL — ABNORMAL LOW (ref >39)
LDL Chol Calc (NIH): 88 mg/dL (ref 0–99)
Triglycerides: 202 mg/dL — ABNORMAL HIGH (ref 0–149)
VLDL Cholesterol Cal: 35 mg/dL (ref 5–40)

## 2019-01-26 LAB — CMP14+EGFR
ALT: 14 IU/L (ref 0–44)
AST: 20 IU/L (ref 0–40)
Albumin/Globulin Ratio: 2.1 (ref 1.2–2.2)
Albumin: 4.4 g/dL (ref 3.6–4.6)
Alkaline Phosphatase: 116 IU/L (ref 39–117)
BUN/Creatinine Ratio: 6 — ABNORMAL LOW (ref 10–24)
BUN: 8 mg/dL (ref 8–27)
Bilirubin Total: 1 mg/dL (ref 0.0–1.2)
CO2: 26 mmol/L (ref 20–29)
Calcium: 9.6 mg/dL (ref 8.6–10.2)
Chloride: 102 mmol/L (ref 96–106)
Creatinine, Ser: 1.25 mg/dL (ref 0.76–1.27)
GFR calc Af Amer: 62 mL/min/{1.73_m2} (ref >59)
GFR calc non Af Amer: 53 mL/min/{1.73_m2} — ABNORMAL LOW (ref >59)
Globulin, Total: 2.1 g/dL (ref 1.5–4.5)
Glucose: 148 mg/dL — ABNORMAL HIGH (ref 65–99)
Potassium: 3.7 mmol/L (ref 3.5–5.2)
Sodium: 144 mmol/L (ref 134–144)
Total Protein: 6.5 g/dL (ref 6.0–8.5)

## 2019-01-26 LAB — TSH: TSH: 3.43 u[IU]/mL (ref 0.450–4.500)

## 2019-02-05 ENCOUNTER — Encounter: Payer: Self-pay | Admitting: Family Medicine

## 2019-02-05 ENCOUNTER — Ambulatory Visit (INDEPENDENT_AMBULATORY_CARE_PROVIDER_SITE_OTHER): Payer: Medicare Other | Admitting: *Deleted

## 2019-02-05 DIAGNOSIS — Z0000 Encounter for general adult medical examination without abnormal findings: Secondary | ICD-10-CM

## 2019-02-05 NOTE — Patient Instructions (Signed)

## 2019-02-05 NOTE — Progress Notes (Signed)
MEDICARE ANNUAL WELLNESS VISIT  02/05/2019  Telephone Visit Disclaimer This Medicare AWV was conducted by telephone due to national recommendations for restrictions regarding the COVID-19 Pandemic (e.g. social distancing).  I verified, using two identifiers, that I am speaking with James Thomas. I discussed the limitations, risks, security, and privacy concerns of performing an evaluation and management service by telephone and the potential availability of an in-person appointment in the future. The patient expressed understanding and agreed to proceed.   Subjective:  James Thomas is a 85 y.o. male patient of James Thomas who had a Medicare Annual Wellness Visit today via telephone. Cashden is Retired and lives with their spouse. he has 1 child. he reports that he is socially active and does interact with friends/family regularly. he is minimally physically active and enjoys tinkering with old cars doing mechanical and body work.  Patient Care Team: James Thomas as PCP - General (Family Medicine) James Thomas as Referring Physician (Dermatology) James Thomas as Consulting Physician (Gastroenterology) James Thomas as Consulting Physician (Pulmonary Disease) James Thomas as Consulting Physician (Neurology) James Thomas as Consulting Physician (Cardiology)  Advanced Directives 02/05/2019 02/02/2018 01/31/2017 01/27/2011  Does Patient Have a Medical Advance Directive? Yes Yes Yes Patient has advance directive, copy not in chart  Type of Advance Directive [place];Living will [place];Living will [place];Living will Living will  Does patient want to make changes to medical advance directive? No - Patient declined No - Patient declined No - Patient declined -  Copy of [place] in Chart? No - copy requested No - copy  requested No - copy requested Copy requested from family  Pre-existing out of facility DNR order (yellow form or pink MOST form) - - - No    Hospital Utilization Over the Past 12 Months: # of hospitalizations or ER visits: 0 # of surgeries: 0  Review of Systems    Patient reports that his overall health is unchanged compared to last year.  History obtained from chart review  Patient Reported Readings (BP, Pulse, CBG, Weight, etc) none  Pain Assessment Pain : No/denies pain     Current Medications & Allergies (verified) Allergies as of 02/05/2019      Reactions   Fenofibrate Cough   Meloxicam    Unsure but thinks it gave him a rash   Niacin And Related Itching, Rash      Medication List       Accurate as of February 05, 2019  8:51 AM. If you have any questions, ask your nurse or doctor.        amLODipine 5 MG tablet Commonly known as: NORVASC Take 1.5 tablets (7.5 mg total) by mouth daily.   CENTRUM SILVER 50+MEN PO Take 1 tablet by mouth daily.   cholecalciferol 1000 units tablet Commonly known as: VITAMIN D Take 2,000 Units by mouth daily.   CITRACAL + D PO Take by mouth daily.   cyanocobalamin 1000 MCG/ML injection Commonly known as: (VITAMIN B-12) Inject 1 mL (1,000 mcg total) into the muscle every 14 (fourteen) days.   Fish Oil 1200 MG Caps Take 2 capsules by mouth daily.   levothyroxine 75 MCG tablet Commonly known as: SYNTHROID Take 1 tablet (75 mcg total) by mouth daily.   losartan 100 MG tablet Commonly known as: COZAAR Take 100 mg by mouth daily.  polyethylene glycol 17 g packet Commonly known as: MIRALAX / GLYCOLAX Take 17 g by mouth at bedtime.   pravastatin 20 MG tablet Commonly known as: PRAVACHOL Take 1 tablet (20 mg total) by mouth daily.       History (reviewed): Past Medical History:  Diagnosis Date  . Arthritis   . B12 deficiency    On injections  . Hypercholesteremia   . Hypertension   . Hypothyroidism   .  Osteoporosis    Past Surgical History:  Procedure Laterality Date  . BACK SURGERY  1979;1985   slipped disc;ruptured disc  . CATARACT EXTRACTION W/PHACO  02/01/2011   Procedure: CATARACT EXTRACTION PHACO AND INTRAOCULAR LENS PLACEMENT (IOC);  Surgeon: James Thomas;  Location: [facility];  Service: Ophthalmology;  Laterality: Right;  CDE:18.53   Family History  Problem Relation Age of Onset  . Diabetes Father   . Heart attack Maternal Grandmother   . Heart attack Brother 21  . Anesthesia problems Neg Hx   . Hypotension Neg Hx   . Malignant hyperthermia Neg Hx   . Pseudochol deficiency Neg Hx   . Colon cancer Neg Hx    Social History   Socioeconomic History  . Marital status: Married    Spouse name: James Thomas  . Number of children: 1  . Years of education: 14 years  . Highest education level: Associate degree: occupational, Hotel manager, or vocational program  Occupational History  . Occupation: Retired    Comment: Engineer, building services, bodywork  Tobacco Use  . Smoking status: Former Smoker    Packs/day: 1.00    Years: 10.00    Pack years: 10.00    Types: Cigarettes    Quit date: 01/27/1972    Years since quitting: 47.0  . Smokeless tobacco: Never Used  Substance and Sexual Activity  . Alcohol use: No  . Drug use: No  . Sexual activity: Not Currently  Other Topics Concern  . 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 He is married and lives in a one story home with a basement with his wife. They have one adult daughter and two granddaughters.    Social Determinants of Health   Financial Resource Strain: Low Risk   . Difficulty of Paying Living Expenses: Not hard at all  Food Insecurity: No Food Insecurity  . Worried About Charity fundraiser in the Last Year: Never true  . Ran Out of Food in the Last Year: Never true  Transportation Needs: No Transportation Needs  . Lack of Transportation (Medical): No   . Lack of Transportation (Non-Medical): No  Physical Activity: Inactive  . Days of Exercise per Week: 0 days  . Minutes of Exercise per Session: 0 min  Stress: No Stress Concern Present  . Feeling of Stress : Not at all  Social Connections: Not Isolated  . Frequency of Communication with Friends and Family: More than three times a week  . Frequency of Social Gatherings with Friends and Family: More than three times a week  . Attends Religious Services: More than 4 times per year  . Active Member of Clubs or Organizations: Yes  . Attends Archivist Meetings: More than 4 times per year  . Marital Status: Married    Activities of Daily Living In your present state of health, do you have any difficulty performing the following activities: 02/05/2019  Hearing? Y  Comment wears hearing  aids  Vision? N  Comment wears glasses-gets yearly eye exam  Difficulty concentrating or making decisions? Y  Comment has trouble concentrating while reading  Walking or climbing stairs? N  Dressing or bathing? N  Doing errands, shopping? N  Preparing Food and eating ? N  Using the Toilet? N  In the past six months, have you accidently leaked urine? Y  Comment rarely  Do you have problems with loss of bowel control? N  Managing your Medications? Y  Comment his wife puts out all of his medicatins daily for him  Managing your Finances? N  Housekeeping or managing your Housekeeping? Y  Comment wife does most of the housework  Some recent data might be hidden    Patient Education/ Literacy How often do you need to have someone help you when you read instructions, pamphlets, or other written materials from your doctor or pharmacy?: 1 - Never What is the last grade level you completed in school?: [employer] in [place]  Exercise Current Exercise Habits: The patient does not participate in regular exercise at present, Exercise limited by: None identified  Diet Patient  reports consuming 3 meals a day and 1 snack(s) a day Patient reports that his primary diet is: Regular Patient reports that she does have regular access to food.   Depression Screen PHQ 2/9 Scores 02/05/2019 01/25/2019 07/25/2018 02/02/2018 01/23/2018 09/19/2017 08/18/2017  PHQ - 2 Score 0 0 0 0 0 0 0     Fall Risk Fall Risk  02/05/2019 01/25/2019 07/25/2018 01/23/2018 12/23/2017  Falls in the past year? 0 0 0 0 0  Number falls in past yr: - - - - 0  Injury with Fall? - - - - 0  Comment - - - - -     Objective:  James Thomas seemed alert and oriented and he participated appropriately during our telephone visit.  Blood Pressure Weight BMI  BP Readings from Last 3 Encounters:  01/25/19 136/77  07/25/18 139/80  02/02/18 133/75   Wt Readings from Last 3 Encounters:  01/25/19 188 lb (85.3 kg)  07/25/18 188 lb 3.2 oz (85.4 kg)  02/02/18 186 lb (84.4 kg)   BMI Readings from Last 1 Encounters:  01/25/19 24.80 kg/m    *Unable to obtain current vital signs, weight, and BMI due to telephone visit type  Hearing/Vision  . Tobyn did not seem to have difficulty with hearing/understanding during the telephone conversation . Reports that he has had a formal eye exam by an eye care professional within the past year . Reports that he has not had a formal hearing evaluation within the past year *Unable to fully assess hearing and vision during telephone visit type  Cognitive Function: 6CIT Screen 02/05/2019  What Year? 0 points  What month? 0 points  What time? 0 points  Count back from 20 0 points  Months in reverse 2 points  Repeat phrase 6 points  Total Score 8   (Normal:0-7, Significant for Dysfunction: >8)  Normal Cognitive Function Screening: No: recommend repeating MMSE at next visit with PCP   Immunization & Health Maintenance Record Immunization History  Administered Date(s) Administered  . Influenza, High Dose Seasonal PF 10/22/2015, 10/22/2016, 09/28/2017  . Influenza,inj,Quad  PF,6+ Mos 10/20/2012, 10/05/2013, 10/22/2014  . Influenza-Unspecified 11/06/2018  . PFIZER SARS-COV-2 Vaccination 01/24/2019  . Pneumococcal Conjugate-13 01/31/2017  . Pneumococcal Polysaccharide-23 10/20/2012  . Tdap 08/18/2010  . Zoster 10/05/2006  . Zoster Recombinat (Shingrix) 02/02/2018    Health Maintenance  Topic Date Due  . TETANUS/TDAP  08/17/2020  . INFLUENZA VACCINE  Completed  . PNA vac Low Risk Adult  Completed       Assessment  This is a routine wellness examination for James Thomas.  Health Maintenance: Due or Overdue There are no preventive care reminders to display for this patient.  James Thomas does not need a referral for Community Assistance: Care Management:   no Social Work:    no Prescription Assistance:  no Nutrition/Diabetes Education:  no   Plan:  Personalized Goals Goals Addressed            This Visit's Progress   . DIET - INCREASE WATER INTAKE       Try to drink 6-8 glasses of water daily      Personalized Health Maintenance & Screening Recommendations  Advanced directives: has an advanced directive - a copy HAS NOT been provided. 2nd Shingrix  Lung Cancer Screening Recommended: no (Low Dose CT Chest recommended if Age 66-80 years, 30 pack-year currently smoking OR have quit w/in past 15 years) Hepatitis C Screening recommended: no HIV Screening recommended: no  Advanced Directives: Written information was not prepared per patient's request.  Referrals & Orders  Follow-up Plan . Follow-up with James Thomas as planned . Consider 2nd Shingrix at your next visit with you PCP . Bring a copy of your Advanced Directives in for our records   I have personally reviewed and noted the following in the patient's chart:   . Medical and social history . Use of alcohol, tobacco or illicit drugs  . Current medications and supplements . Functional ability and status . Nutritional status . Physical activity . Advanced  directives . List of other physicians . Hospitalizations, surgeries, and ER visits in previous 12 months . Vitals . Screenings to include cognitive, depression, and falls . Referrals and appointments  In addition, I have reviewed and discussed with James Thomas certain preventive protocols, quality metrics, and best practice recommendations. A written personalized care plan for preventive services as well as general preventive health recommendations is available and can be mailed to the patient at his request.      Milas Hock, LPN  [id number]

## 2019-03-21 DIAGNOSIS — M170 Bilateral primary osteoarthritis of knee: Secondary | ICD-10-CM | POA: Diagnosis not present

## 2019-03-21 DIAGNOSIS — M1712 Unilateral primary osteoarthritis, left knee: Secondary | ICD-10-CM | POA: Diagnosis not present

## 2019-03-21 DIAGNOSIS — M1711 Unilateral primary osteoarthritis, right knee: Secondary | ICD-10-CM | POA: Diagnosis not present

## 2019-03-26 ENCOUNTER — Telehealth: Payer: Self-pay | Admitting: Family Medicine

## 2019-03-26 NOTE — Chronic Care Management (AMB) (Signed)
  Chronic Care Management   Outreach Note  03/26/2019 Name: [name] MRN: [id number] DOB: 18-Jul-1934  [name] is a 85 y.o. year old male who is a primary care patient of Dettinger, Fransisca Kaufmann, MD. I reached out to [name] by phone today in response to a referral sent by Mr. Evan Osen [facility] health plan.     A telephone outreach was attempted today spoke with daughter [name]. Daughter will discuss program with father and call back if interested. The patient was referred to the case management team for assistance with care management and care coordination.   Follow Up Plan: If patient returns call to provider office, please advise to call [place] at [phone].  [place], [place] [address] Direct Dial: [phone] [email or web address]: [email or web address]

## 2019-03-26 NOTE — Chronic Care Management (AMB) (Deleted)
  Chronic Care Management   Outreach Note  03/26/2019 Name: James Thomas MRN: [id number] DOB: 1934/07/01  James Thomas is a 85 y.o. year old male who is a primary care patient of Dettinger, Fransisca Kaufmann, MD. I reached out to James Thomas by phone today in response to a referral sent by James Thomas [facility] health plan.     A telephone outreach was attempted today spoke with patient daughter she will review information with father  Chronic Care Management   Outreach Note  03/26/2019 Name: James Thomas MRN: [id number] DOB: Feb 02, 1934  James Thomas is a 85 y.o. year old male who is a primary care patient of Dettinger, Fransisca Kaufmann, MD. I reached out to James Thomas by phone today in response to a referral sent by James Thomas [facility] health plan.     A telephone outreach was attempted today spoke with patients daughter James Thomas provided information about CCM program she will speak to her dad and see if his intrested. The patient was referred to the case management team for assistance with care management and care coordination.    Follow Up Plan: The care management team will reach out to the patient again over the next 7 days.  If patient returns call to provider office, please advise to call [place] at [phone].  [place], [place] [address] Direct Dial: [phone] [email or web address]: [email or web address]

## 2019-04-02 NOTE — Chronic Care Management (AMB) (Signed)
  Chronic Care Management   Outreach Note  04/02/2019 Name: ADVAITH VAYNSHTEYN MRN: [id number] DOB: [birth year]  SHINICHI DEGUIA is a 85 y.o. year old male who is a primary care patient of Dettinger, Fransisca Kaufmann, MD. I reached out to [name] by phone today in response to a referral sent by Mr. Ashish [facility] health plan.     A second telephone outreach was attempted today spoke with daughter not able to speak will call back. The patient was referred to the case management team for assistance with care management and care coordination.   Follow Up Plan: The care management team will reach out to the patient again over the next 7 days. If patient returns call to provider office, please advise to call [place] at [phone].  [place], [place] [address] Direct Dial: [phone] [email or web address]: [email or web address]

## 2019-04-05 NOTE — Chronic Care Management (AMB) (Signed)
  Chronic Care Management   Outreach Note  04/05/2019 Name: James Thomas MRN: [id number] DOB: [birth year]  FATHI DESCHEPPER is a 85 y.o. year old male who is a primary care patient of Dettinger, Fransisca Kaufmann, MD. I reached out to James Thomas by phone today in response to a referral sent by James Thomas.     Third unsuccessful telephone outreach was attempted today. The patient was referred to the case management team for assistance with care management and care coordination. The patient's primary care provider has been notified of our unsuccessful attempts to make or maintain contact with the patient. The care management team is pleased to engage with this patient at any time in the future should he/she be interested in assistance from the care management team.   Follow Up Thomas: If patient returns call to provider office, please advise to call [place] at [phone].  [place], [place] [address] Direct Dial: [phone] [email or web address]

## 2019-04-28 ENCOUNTER — Other Ambulatory Visit: Payer: Self-pay | Admitting: Family Medicine

## 2019-04-28 DIAGNOSIS — I10 Essential (primary) hypertension: Secondary | ICD-10-CM

## 2019-05-01 DIAGNOSIS — H5203 Hypermetropia, bilateral: Secondary | ICD-10-CM | POA: Diagnosis not present

## 2019-05-01 DIAGNOSIS — Z961 Presence of intraocular lens: Secondary | ICD-10-CM | POA: Diagnosis not present

## 2019-05-01 DIAGNOSIS — H2512 Age-related nuclear cataract, left eye: Secondary | ICD-10-CM | POA: Diagnosis not present

## 2019-05-01 DIAGNOSIS — H52222 Regular astigmatism, left eye: Secondary | ICD-10-CM | POA: Diagnosis not present

## 2019-05-01 DIAGNOSIS — H524 Presbyopia: Secondary | ICD-10-CM | POA: Diagnosis not present

## 2019-05-05 ENCOUNTER — Other Ambulatory Visit: Payer: Self-pay | Admitting: Family Medicine

## 2019-05-15 DIAGNOSIS — M79676 Pain in unspecified toe(s): Secondary | ICD-10-CM | POA: Diagnosis not present

## 2019-05-15 DIAGNOSIS — B351 Tinea unguium: Secondary | ICD-10-CM | POA: Diagnosis not present

## 2019-07-25 ENCOUNTER — Other Ambulatory Visit: Payer: Self-pay

## 2019-07-25 ENCOUNTER — Ambulatory Visit (INDEPENDENT_AMBULATORY_CARE_PROVIDER_SITE_OTHER): Payer: Medicare Other | Admitting: Family Medicine

## 2019-07-25 ENCOUNTER — Encounter: Payer: Self-pay | Admitting: Family Medicine

## 2019-07-25 VITALS — BP 133/81 | HR 75 | Temp 98.1°F | Ht 73.0 in | Wt 180.2 lb

## 2019-07-25 DIAGNOSIS — R7303 Prediabetes: Secondary | ICD-10-CM

## 2019-07-25 DIAGNOSIS — E538 Deficiency of other specified B group vitamins: Secondary | ICD-10-CM

## 2019-07-25 DIAGNOSIS — E782 Mixed hyperlipidemia: Secondary | ICD-10-CM | POA: Diagnosis not present

## 2019-07-25 DIAGNOSIS — E039 Hypothyroidism, unspecified: Secondary | ICD-10-CM

## 2019-07-25 DIAGNOSIS — I10 Essential (primary) hypertension: Secondary | ICD-10-CM

## 2019-07-25 DIAGNOSIS — Z125 Encounter for screening for malignant neoplasm of prostate: Secondary | ICD-10-CM | POA: Diagnosis not present

## 2019-07-25 LAB — BAYER DCA HB A1C WAIVED: HB A1C (BAYER DCA - WAIVED): 6 % (ref <7.0)

## 2019-07-25 NOTE — Progress Notes (Signed)
BP 133/81   Pulse 75   Temp 98.1 F (36.7 C)   Ht _0  (1.854 m)   Wt 180 lb 4 oz (81.8 kg)   SpO2 98%   BMI 23.78 kg/m    Subjective:   Patient ID: James Thomas, male    DOB: [birth year], 85 y.o.   MRN: [id number]  HPI: James Thomas is a 85 y.o. male presenting on 07/25/2019 for Medical Management of Chronic Issues, Hypertension, and Prediabetes   HPI Prediabetes Patient comes in today for recheck of his diabetes. Patient has been currently taking no medication, will check A1c today. Patient is currently on an ACE inhibitor/ARB. Patient has not seen an ophthalmologist this year. Patient denies any issues with their feet. The symptom started onset as an adult hypertension and hypothyroidism and hyperlipidemia ARE RELATED TO DM   Hypertension Patient is currently on amlodipine and losartan, and their blood pressure today is 133/81. Patient denies any lightheadedness or dizziness. Patient denies headaches, blurred vision, chest pains, shortness of breath, or weakness. Denies any side effects from medication and is content with current medication.  Hypothyroidism recheck Patient is coming in for thyroid recheck today as well. They deny any issues with hair changes or heat or cold problems or diarrhea or constipation. They deny any chest pain or palpitations. They are currently on levothyroxine 75 micrograms    Hyperlipidemia Patient is coming in for recheck of his hyperlipidemia. The patient is currently taking fish oil and pravastatin. They deny any issues with myalgias or history of liver damage from it. They deny any focal numbness or weakness or chest pain.   Patient has vitamin B12 deficiency and has been taking injections and will recheck today.  Relevant past medical, surgical, family and social history reviewed and updated as indicated. Interim medical history since our last visit reviewed. Allergies and medications reviewed and updated.  Review of Systems    Constitutional: Negative for chills and fever.  Eyes: Negative for visual disturbance.  Respiratory: Negative for shortness of breath and wheezing.   Cardiovascular: Positive for leg swelling. Negative for chest pain.  Musculoskeletal: Negative for back pain and gait problem.  Skin: Negative for rash.  Neurological: Negative for dizziness, weakness and numbness.  All other systems reviewed and are negative.   Per HPI unless specifically indicated above   Allergies as of 07/25/2019      Reactions   Fenofibrate Cough   Meloxicam    Unsure but thinks it gave him a rash   Niacin And Related Itching, Rash      Medication List       Accurate as of July 25, 2019  8:43 AM. If you have any questions, ask your nurse or doctor.        amLODipine 5 MG tablet Commonly known as: NORVASC TAKE 1 & 1/2 (ONE & ONE-HALF) TABLETS BY MOUTH ONCE DAILY   CENTRUM SILVER 50+MEN PO Take 1 tablet by mouth daily.   cholecalciferol 1000 units tablet Commonly known as: VITAMIN D Take 2,000 Units by mouth daily.   CITRACAL + D PO Take by mouth daily.   cyanocobalamin 1000 MCG/ML injection Commonly known as: (VITAMIN B-12) Inject 1 mL (1,000 mcg total) into the muscle every 14 (fourteen) days.   Fish Oil 1200 MG Caps Take 2 capsules by mouth daily.   levothyroxine 75 MCG tablet Commonly known as: SYNTHROID Take 1 tablet by mouth once daily   losartan 100 MG tablet Commonly known as:  COZAAR Take 100 mg by mouth daily.   polyethylene glycol 17 g packet Commonly known as: MIRALAX / GLYCOLAX Take 17 g by mouth at bedtime.   pravastatin 20 MG tablet Commonly known as: PRAVACHOL Take 1 tablet (20 mg total) by mouth daily.        Objective:   BP 133/81   Pulse 75   Temp 98.1 F (36.7 C)   Ht _0  (1.854 m)   Wt 180 lb 4 oz (81.8 kg)   SpO2 98%   BMI 23.78 kg/m   Wt Readings from Last 3 Encounters:  07/25/19 180 lb 4 oz (81.8 kg)  01/25/19 188 lb (85.3 kg)  07/25/18 188  lb 3.2 oz (85.4 kg)    Physical Exam Vitals and nursing note reviewed.  Constitutional:      General: He is not in acute distress.    Appearance: He is well-developed. He is not diaphoretic.  Eyes:     General: No scleral icterus.    Conjunctiva/sclera: Conjunctivae normal.  Neck:     Thyroid: No thyromegaly.  Cardiovascular:     Rate and Rhythm: Normal rate and regular rhythm.     Heart sounds: Normal heart sounds. No murmur heard.   Pulmonary:     Effort: Pulmonary effort is normal. No respiratory distress.     Breath sounds: Normal breath sounds. No wheezing or rales.  Musculoskeletal:        General: Swelling (1+ pitting edema bilateral lower extremities) present. Normal range of motion.     Cervical back: Neck supple.  Lymphadenopathy:     Cervical: No cervical adenopathy.  Skin:    General: Skin is warm and dry.     Findings: No rash.  Neurological:     Mental Status: He is alert and oriented to person, place, and time.     Coordination: Coordination normal.  Psychiatric:        Behavior: Behavior normal.       Assessment & Plan:   Problem List Items Addressed This Visit      Cardiovascular and Mediastinum   HTN (hypertension)   Relevant Orders   CMP14+EGFR     Endocrine   Hypothyroid   Relevant Orders   CBC with Differential/Platelet   TSH     Other   HLD (hyperlipidemia)   Relevant Orders   Lipid panel   Prediabetes - Primary   Relevant Orders   CBC with Differential/Platelet   Bayer DCA Hb A1c Waived    Other Visit Diagnoses    Prostate cancer screening       Relevant Orders   PSA, total and free   B12 deficiency       Relevant Orders   CBC with Differential/Platelet   Vitamin B12      Patient has small amount of swelling in his legs, recommended movement or compression stockings to help reduce this. Follow up plan: Return in about 6 months (around 01/25/2020), or if symptoms worsen or fail to improve, for Hypertension and cholesterol  recheck.  Counseling provided for all of the vaccine components Orders Placed This Encounter  Procedures  . CBC with Differential/Platelet  . CMP14+EGFR  . Lipid panel  . Bayer DCA Hb A1c Waived  . TSH  . PSA, total and free  . Vitamin B12    Caryl Pina, MD [facility] 07/25/2019, 8:43 AM

## 2019-07-26 LAB — CMP14+EGFR
ALT: 12 IU/L (ref 0–44)
AST: 15 IU/L (ref 0–40)
Albumin/Globulin Ratio: 2.2 (ref 1.2–2.2)
Albumin: 4.6 g/dL (ref 3.6–4.6)
Alkaline Phosphatase: 120 IU/L (ref 48–121)
BUN/Creatinine Ratio: 6 — ABNORMAL LOW (ref 10–24)
BUN: 8 mg/dL (ref 8–27)
Bilirubin Total: 0.7 mg/dL (ref 0.0–1.2)
CO2: 27 mmol/L (ref 20–29)
Calcium: 10 mg/dL (ref 8.6–10.2)
Chloride: 101 mmol/L (ref 96–106)
Creatinine, Ser: 1.31 mg/dL — ABNORMAL HIGH (ref 0.76–1.27)
GFR calc Af Amer: 58 mL/min/{1.73_m2} — ABNORMAL LOW (ref >59)
GFR calc non Af Amer: 50 mL/min/{1.73_m2} — ABNORMAL LOW (ref >59)
Globulin, Total: 2.1 g/dL (ref 1.5–4.5)
Glucose: 137 mg/dL — ABNORMAL HIGH (ref 65–99)
Potassium: 3.8 mmol/L (ref 3.5–5.2)
Sodium: 142 mmol/L (ref 134–144)
Total Protein: 6.7 g/dL (ref 6.0–8.5)

## 2019-07-26 LAB — LIPID PANEL
Chol/HDL Ratio: 5.4 ratio — ABNORMAL HIGH (ref 0.0–5.0)
Cholesterol, Total: 173 mg/dL (ref 100–199)
HDL: 32 mg/dL — ABNORMAL LOW (ref >39)
LDL Chol Calc (NIH): 102 mg/dL — ABNORMAL HIGH (ref 0–99)
Triglycerides: 229 mg/dL — ABNORMAL HIGH (ref 0–149)
VLDL Cholesterol Cal: 39 mg/dL (ref 5–40)

## 2019-07-26 LAB — PSA, TOTAL AND FREE
PSA, Free Pct: 29.6 %
PSA, Free: 0.71 ng/mL
Prostate Specific Ag, Serum: 2.4 ng/mL (ref 0.0–4.0)

## 2019-07-26 LAB — CBC WITH DIFFERENTIAL/PLATELET
Basophils Absolute: 0.1 10*3/uL (ref 0.0–0.2)
Basos: 1 %
EOS (ABSOLUTE): 0.3 10*3/uL (ref 0.0–0.4)
Eos: 3 %
Hematocrit: 44 % (ref 37.5–51.0)
Hemoglobin: 15.2 g/dL (ref 13.0–17.7)
Immature Grans (Abs): 0 10*3/uL (ref 0.0–0.1)
Immature Granulocytes: 0 %
Lymphocytes Absolute: 1.3 10*3/uL (ref 0.7–3.1)
Lymphs: 16 %
MCH: 31.3 pg (ref 26.6–33.0)
MCHC: 34.5 g/dL (ref 31.5–35.7)
MCV: 91 fL (ref 79–97)
Monocytes Absolute: 0.7 10*3/uL (ref 0.1–0.9)
Monocytes: 8 %
Neutrophils Absolute: 6 10*3/uL (ref 1.4–7.0)
Neutrophils: 72 %
Platelets: 328 10*3/uL (ref 150–450)
RBC: 4.86 x10E6/uL (ref 4.14–5.80)
RDW: 12.6 % (ref 11.6–15.4)
WBC: 8.4 10*3/uL (ref 3.4–10.8)

## 2019-07-26 LAB — TSH: TSH: 0.977 u[IU]/mL (ref 0.450–4.500)

## 2019-07-26 LAB — VITAMIN B12: Vitamin B-12: 995 pg/mL (ref 232–1245)

## 2019-07-31 ENCOUNTER — Other Ambulatory Visit: Payer: Self-pay | Admitting: Family Medicine

## 2019-07-31 DIAGNOSIS — I10 Essential (primary) hypertension: Secondary | ICD-10-CM

## 2019-08-05 ENCOUNTER — Other Ambulatory Visit: Payer: Self-pay | Admitting: Family Medicine

## 2019-08-14 ENCOUNTER — Other Ambulatory Visit: Payer: Self-pay | Admitting: Family Medicine

## 2019-08-23 ENCOUNTER — Other Ambulatory Visit: Payer: Self-pay | Admitting: Family Medicine

## 2019-10-02 DIAGNOSIS — M79676 Pain in unspecified toe(s): Secondary | ICD-10-CM | POA: Diagnosis not present

## 2019-10-02 DIAGNOSIS — B351 Tinea unguium: Secondary | ICD-10-CM | POA: Diagnosis not present

## 2019-10-27 ENCOUNTER — Other Ambulatory Visit: Payer: Self-pay | Admitting: Family Medicine

## 2019-10-29 ENCOUNTER — Encounter: Payer: Self-pay | Admitting: Family Medicine

## 2019-10-29 ENCOUNTER — Other Ambulatory Visit: Payer: Self-pay | Admitting: *Deleted

## 2019-10-29 MED ORDER — LOSARTAN POTASSIUM 100 MG PO TABS: 100.0000 mg | ORAL_TABLET | Freq: Every day | ORAL | 0 refills | Status: DC

## 2019-11-21 DIAGNOSIS — D485 Neoplasm of uncertain behavior of skin: Secondary | ICD-10-CM | POA: Diagnosis not present

## 2019-11-21 DIAGNOSIS — D0439 Carcinoma in situ of skin of other parts of face: Secondary | ICD-10-CM | POA: Diagnosis not present

## 2019-11-21 DIAGNOSIS — L570 Actinic keratosis: Secondary | ICD-10-CM | POA: Diagnosis not present

## 2019-12-10 DIAGNOSIS — M170 Bilateral primary osteoarthritis of knee: Secondary | ICD-10-CM | POA: Diagnosis not present

## 2020-01-01 DIAGNOSIS — M79676 Pain in unspecified toe(s): Secondary | ICD-10-CM | POA: Diagnosis not present

## 2020-01-01 DIAGNOSIS — B351 Tinea unguium: Secondary | ICD-10-CM | POA: Diagnosis not present

## 2020-01-21 ENCOUNTER — Other Ambulatory Visit: Payer: Self-pay | Admitting: Family Medicine

## 2020-01-25 ENCOUNTER — Ambulatory Visit: Payer: Medicare Other | Admitting: Family Medicine

## 2020-02-06 ENCOUNTER — Ambulatory Visit (INDEPENDENT_AMBULATORY_CARE_PROVIDER_SITE_OTHER): Payer: Medicare Other | Admitting: *Deleted

## 2020-02-06 VITALS — Ht 73.0 in | Wt 180.3 lb

## 2020-02-06 DIAGNOSIS — Z0000 Encounter for general adult medical examination without abnormal findings: Secondary | ICD-10-CM | POA: Diagnosis not present

## 2020-02-06 NOTE — Patient Instructions (Signed)
  James Thomas , Thank you for taking time to come for your Medicare Wellness Visit. I appreciate your ongoing commitment to your health goals. Please review the following plan we discussed and let me know if I can assist you in the future.   These are the goals we discussed: Goals    .  DIET - INCREASE WATER INTAKE      Try to drink 6-8 glasses of water daily    .  Exercise 3x per week (30 min per time)      Try to exercise for at least 30 minutes, 3 times weekly    .  Patient Stated (pt-stated)      Read books more often.        This is a list of the screening recommended for you and due dates:  Health Maintenance  Topic Date Due  . COVID-19 Vaccine (3 - Pfizer risk 4-dose series) 03/21/2019  . Flu Shot  08/05/2019  . Tetanus Vaccine  08/17/2020  . Pneumonia vaccines  Completed

## 2020-02-06 NOTE — Progress Notes (Signed)
MEDICARE ANNUAL WELLNESS VISIT  02/06/2020  Telephone Visit Disclaimer This Medicare AWV was conducted by telephone due to national recommendations for restrictions regarding the COVID-19 Pandemic (e.g. social distancing).  I verified, using two identifiers, that I am speaking with James Thomas or their authorized healthcare agent. I discussed the limitations, risks, security, and privacy concerns of performing an evaluation and management service by telephone and the potential availability of an in-person appointment in the future. The patient expressed understanding and agreed to proceed.  Location of Patient: Home Location of Provider (nurse):  Office   Subjective:    James Thomas is a 86 y.o. male patient of Dettinger, Fransisca Kaufmann, MD who had a Medicare Annual Wellness Visit today via telephone. [name] is Retired and lives with their spouse. he has 1 child. he reports that he is socially active and does interact with friends/family regularly. he is not physically active and enjoys watching television.  Patient Care Team: Dettinger, Fransisca Kaufmann, MD as PCP - General (Family Medicine) Sandford Craze, MD as Referring Physician (Dermatology) Danie Binder, MD (Inactive) as Consulting Physician (Gastroenterology) Rigoberto Noel, MD as Consulting Physician (Pulmonary Disease) Cameron Sprang, MD as Consulting Physician (Neurology) Minus Breeding, MD as Consulting Physician (Cardiology)  Advanced Directives 02/06/2020 02/05/2019 02/02/2018 01/31/2017 01/27/2011  Does Patient Have a Medical Advance Directive? Yes Yes Yes Yes Patient has advance directive, copy not in chart  Type of Advance Directive [place];Living will [place];Living will [place];Living will [place];Living will Living will  Does patient want to make changes to medical advance directive? No - Patient declined No - Patient declined No - Patient  declined No - Patient declined -  Copy of [place] in Chart? No - copy requested No - copy requested No - copy requested No - copy requested Copy requested from family  Pre-existing out of facility DNR order (yellow form or pink MOST form) - - - - No    Hospital Utilization Over the Past 12 Months: # of hospitalizations or ER visits: 0 # of surgeries: 0  Review of Systems    Patient reports that his overall health is unchanged compared to last year.  History obtained from chart review and the patient General ROS: negative  Patient Reported Readings (BP, Pulse, CBG, Weight, etc) none  Pain Assessment Pain : No/denies pain     Current Medications & Allergies (verified) Allergies as of 02/06/2020      Reactions   Fenofibrate Cough   Meloxicam    Unsure but thinks it gave him a rash   Niacin And Related Itching, Rash      Medication List       Accurate as of February 06, 2020  8:30 AM. If you have any questions, ask your nurse or doctor.        amLODipine 5 MG tablet Commonly known as: NORVASC TAKE 1 & 1/2 (ONE & ONE-HALF) TABLETS BY MOUTH ONCE DAILY   CENTRUM SILVER 50+MEN PO Take 1 tablet by mouth daily.   cholecalciferol 1000 units tablet Commonly known as: VITAMIN D Take 2,000 Units by mouth daily.   CITRACAL + D PO Take by mouth daily.   cyanocobalamin 1000 MCG/ML injection Commonly known as: (VITAMIN B-12) INJECT 1 ML INTRAMUSCULARLY EVERY 14 DAYS   Euthyrox 75 MCG tablet Generic drug: levothyroxine Take 1 tablet by mouth once daily   Fish Oil 1200 MG  Caps Take 2 capsules by mouth daily.   losartan 100 MG tablet Commonly known as: COZAAR Take 1 tablet (100 mg total) by mouth daily.   polyethylene glycol 17 g packet Commonly known as: MIRALAX / GLYCOLAX Take 17 g by mouth at bedtime.   pravastatin 20 MG tablet Commonly known as: PRAVACHOL Take 1 tablet by mouth once daily       History (reviewed): Past Medical History:   Diagnosis Date  . Arthritis   . B12 deficiency    On injections  . Hypercholesteremia   . Hypertension   . Hypothyroidism   . Osteoporosis    Past Surgical History:  Procedure Laterality Date  . BACK SURGERY  1979;1985   slipped disc;ruptured disc  . CATARACT EXTRACTION W/PHACO  02/01/2011   Procedure: CATARACT EXTRACTION PHACO AND INTRAOCULAR LENS PLACEMENT (IOC);  Surgeon: Tonny Branch, MD;  Location: [facility];  Service: Ophthalmology;  Laterality: Right;  CDE:18.53   Family History  Problem Relation Age of Onset  . Diabetes Father   . Heart attack Maternal Grandmother   . Heart attack Brother 70  . Anesthesia problems Neg Hx   . Hypotension Neg Hx   . Malignant hyperthermia Neg Hx   . Pseudochol deficiency Neg Hx   . Colon cancer Neg Hx    Social History   Socioeconomic History  . Marital status: Married    Spouse name: [name]  . Number of children: 1  . Years of education: 14 years  . Highest education level: Associate degree: occupational, Hotel manager, or vocational program  Occupational History  . Occupation: Retired    Comment: Engineer, building services, bodywork  Tobacco Use  . Smoking status: Former Smoker    Packs/day: 1.00    Years: 10.00    Pack years: 10.00    Types: Cigarettes    Quit date: 01/27/1972    Years since quitting: 48.0  . Smokeless tobacco: Never Used  Vaping Use  . Vaping Use: Never used  Substance and Sexual Activity  . Alcohol use: No  . Drug use: No  . Sexual activity: Not Currently  Other Topics Concern  . 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 He is married and lives in a one story home with a basement with his wife. They have one adult daughter and two granddaughters.    Social Determinants of Health   Financial Resource Strain: Low Risk   . Difficulty of Paying Living Expenses: Not hard at all  Food Insecurity: No Food Insecurity  . Worried About Ship broker in the Last Year: Never true  . Ran Out of Food in the Last Year: Never true  Transportation Needs: No Transportation Needs  . Lack of Transportation (Medical): No  . Lack of Transportation (Non-Medical): No  Physical Activity: Inactive  . Days of Exercise per Week: 0 days  . Minutes of Exercise per Session: 0 min  Stress: No Stress Concern Present  . Feeling of Stress : Not at all  Social Connections: Socially Integrated  . Frequency of Communication with Friends and Family: More than three times a week  . Frequency of Social Gatherings with Friends and Family: More than three times a week  . Attends Religious Services: More than 4 times per year  . Active Member of Clubs or Organizations: Yes  . Attends Archivist Meetings: Not on file  . Marital  Status: Married    Activities of Daily Living In your present state of health, do you have any difficulty performing the following activities: 02/06/2020  Hearing? Y  Comment Wears hearing aids  Vision? N  Difficulty concentrating or making decisions? N  Walking or climbing stairs? Y  Dressing or bathing? N  Doing errands, shopping? N  Preparing Food and eating ? N  Using the Toilet? N  In the past six months, have you accidently leaked urine? N  Do you have problems with loss of bowel control? N  Managing your Medications? N  Managing your Finances? N  Housekeeping or managing your Housekeeping? N  Some recent data might be hidden    Patient Education/ Literacy How often do you need to have someone help you when you read instructions, pamphlets, or other written materials from your doctor or pharmacy?: 1 - Never What is the last grade level you completed in school?: 12th Grade  Exercise Current Exercise Habits: The patient does not participate in regular exercise at present, Exercise limited by: None identified  Diet Patient reports consuming 3 meals a day and 1 snack(s) a day Patient reports that his primary  diet is: Regular Patient reports that she does have regular access to food.   Depression Screen PHQ 2/9 Scores 02/06/2020 07/25/2019 02/05/2019 01/25/2019 07/25/2018 02/02/2018 01/23/2018  PHQ - 2 Score 0 0 0 0 0 0 0     Fall Risk Fall Risk  02/06/2020 07/25/2019 02/05/2019 01/25/2019 07/25/2018  Falls in the past year? 0 0 0 0 0  Number falls in past yr: - - - - -  Injury with Fall? - - - - -  Comment - - - - -  Risk for fall due to : No Fall Risks - - - -  Follow up Falls evaluation completed - - - -     Objective:  James Thomas seemed alert and oriented and he participated appropriately during our telephone visit.  Blood Pressure Weight BMI  BP Readings from Last 3 Encounters:  07/25/19 133/81  01/25/19 136/77  07/25/18 139/80   Wt Readings from Last 3 Encounters:  02/06/20 180 lb 5.4 oz (81.8 kg)  07/25/19 180 lb 4 oz (81.8 kg)  01/25/19 188 lb (85.3 kg)   BMI Readings from Last 1 Encounters:  02/06/20 23.79 kg/m    *Unable to obtain current vital signs, weight, and BMI due to telephone visit type  Hearing/Vision  . Rohit did  seem to have difficulty with hearing/understanding during the telephone conversation . Reports that he has not had a formal eye exam by an eye care professional within the past year . Reports that he has had a formal hearing evaluation within the past year *Unable to fully assess hearing and vision during telephone visit type  Cognitive Function: 6CIT Screen 02/06/2020 02/05/2019  What Year? 0 points 0 points  What month? 0 points 0 points  What time? 0 points 0 points  Count back from 20 0 points 0 points  Months in reverse 0 points 2 points  Repeat phrase 0 points 6 points  Total Score 0 8   (Normal:0-7, Significant for Dysfunction: >8)  Normal Cognitive Function Screening: Yes   Immunization & Health Maintenance Record Immunization History  Administered Date(s) Administered  . Influenza, High Dose Seasonal PF 10/22/2015, 10/22/2016, 09/28/2017   . Influenza,inj,Quad PF,6+ Mos 10/20/2012, 10/05/2013, 10/22/2014  . Influenza-Unspecified 11/06/2018  . PFIZER(Purple Top)SARS-COV-2 Vaccination 01/24/2019, 02/21/2019  . Pneumococcal Conjugate-13 01/31/2017  .  Pneumococcal Polysaccharide-23 10/20/2012  . Tdap 08/18/2010  . Zoster 10/05/2006  . Zoster Recombinat (Shingrix) 02/02/2018    Health Maintenance  Topic Date Due  . COVID-19 Vaccine (3 - Pfizer risk 4-dose series) 03/21/2019  . INFLUENZA VACCINE  08/05/2019  . TETANUS/TDAP  08/17/2020  . PNA vac Low Risk Adult  Completed       Assessment  This is a routine wellness examination for James Thomas.  Health Maintenance: Due or Overdue Health Maintenance Due  Topic Date Due  . COVID-19 Vaccine (3 - Pfizer risk 4-dose series) 03/21/2019  . INFLUENZA VACCINE  08/05/2019    James Thomas does not need a referral for Community Assistance: Care Management:   no Social Work:    no Prescription Assistance:  no Nutrition/Diabetes Education:  no   Plan:  Personalized Goals Goals Addressed            This Visit's Progress   . Exercise 3x per week (30 min per time)       Try to exercise for at least 30 minutes, 3 times weekly      Personalized Health Maintenance & Screening Recommendations  Influenza vaccine Advanced directives: has an advanced directive - a copy HAS NOT been provided.  Lung Cancer Screening Recommended: no (Low Dose CT Chest recommended if Age 60-80 years, 30 pack-year currently smoking OR have quit w/in past 15 years) Hepatitis C Screening recommended: no HIV Screening recommended: no  Advanced Directives: Written information was not prepared per patient's request.  Referrals & Orders No orders of the defined types were placed in this encounter.   Follow-up Plan . Follow-up with Dettinger, Fransisca Kaufmann, MD as planned . Discuss flu vaccine at next office visit.    I have personally reviewed and noted the following in the patient's chart:    . Medical and social history . Use of alcohol, tobacco or illicit drugs  . Current medications and supplements . Functional ability and status . Nutritional status . Physical activity . Advanced directives . List of other physicians . Hospitalizations, surgeries, and ER visits in previous 12 months . Vitals . Screenings to include cognitive, depression, and falls . Referrals and appointments  In addition, I have reviewed and discussed with James Thomas certain preventive protocols, quality metrics, and best practice recommendations. A written personalized care plan for preventive services as well as general preventive health recommendations is available and can be mailed to the patient at his request.      Wardell Heath, LPN  [phone]   AVS printed and mailed to patient.

## 2020-02-09 ENCOUNTER — Other Ambulatory Visit: Payer: Self-pay | Admitting: Family Medicine

## 2020-02-09 DIAGNOSIS — I10 Essential (primary) hypertension: Secondary | ICD-10-CM

## 2020-02-15 ENCOUNTER — Other Ambulatory Visit: Payer: Self-pay

## 2020-02-15 ENCOUNTER — Ambulatory Visit (INDEPENDENT_AMBULATORY_CARE_PROVIDER_SITE_OTHER): Payer: Medicare Other | Admitting: Family Medicine

## 2020-02-15 ENCOUNTER — Encounter: Payer: Self-pay | Admitting: Family Medicine

## 2020-02-15 VITALS — BP 128/64 | HR 71 | Ht 73.0 in | Wt 184.0 lb

## 2020-02-15 DIAGNOSIS — Z23 Encounter for immunization: Secondary | ICD-10-CM | POA: Diagnosis not present

## 2020-02-15 DIAGNOSIS — E782 Mixed hyperlipidemia: Secondary | ICD-10-CM | POA: Diagnosis not present

## 2020-02-15 DIAGNOSIS — E039 Hypothyroidism, unspecified: Secondary | ICD-10-CM | POA: Diagnosis not present

## 2020-02-15 DIAGNOSIS — R7303 Prediabetes: Secondary | ICD-10-CM

## 2020-02-15 DIAGNOSIS — I10 Essential (primary) hypertension: Secondary | ICD-10-CM | POA: Diagnosis not present

## 2020-02-15 DIAGNOSIS — E538 Deficiency of other specified B group vitamins: Secondary | ICD-10-CM | POA: Diagnosis not present

## 2020-02-15 MED ORDER — CYANOCOBALAMIN 1000 MCG/ML IJ SOLN: INTRAMUSCULAR | 3 refills | Status: DC

## 2020-02-15 MED ORDER — LOSARTAN POTASSIUM 100 MG PO TABS: 100.0000 mg | ORAL_TABLET | Freq: Every day | ORAL | 3 refills | Status: DC

## 2020-02-15 MED ORDER — LEVOTHYROXINE SODIUM 75 MCG PO TABS: 75.0000 ug | ORAL_TABLET | Freq: Every day | ORAL | 3 refills | Status: DC

## 2020-02-15 MED ORDER — AMLODIPINE BESYLATE 5 MG PO TABS: 7.5000 mg | ORAL_TABLET | Freq: Every day | ORAL | 3 refills | Status: DC

## 2020-02-15 MED ORDER — PRAVASTATIN SODIUM 20 MG PO TABS: 20.0000 mg | ORAL_TABLET | Freq: Every day | ORAL | 3 refills | Status: DC

## 2020-02-15 NOTE — Progress Notes (Signed)
BP 128/64   Pulse 71   Ht _0  (1.854 m)   Wt 184 lb (83.5 kg)   SpO2 99%   BMI 24.28 kg/m    Subjective:   Patient ID: James Thomas, male    DOB: 09/22/1934, 86 y.o.   MRN: [id number]  HPI: James Thomas is a 86 y.o. male presenting on 02/15/2020 for Medical Management of Chronic Issues, Hyperlipidemia, and Hypertension   HPI Hypertension Patient is currently on losartan and amlodipine, and their blood pressure today is 128/64. Patient denies any lightheadedness or dizziness. Patient denies headaches, blurred vision, chest pains, shortness of breath, or weakness. Denies any side effects from medication and is content with current medication.   Hyperlipidemia Patient is coming in for recheck of his hyperlipidemia. The patient is currently taking fish oil and pravastatin. They deny any issues with myalgias or history of liver damage from it. They deny any focal numbness or weakness or chest pain.   Hypothyroidism recheck Patient is coming in for thyroid recheck today as well. They deny any issues with hair changes or heat or cold problems or diarrhea or constipation. They deny any chest pain or palpitations. They are currently on levothyroxine 75 micrograms   Prediabetes Patient comes in today for recheck of his diabetes. Patient has been currently taking no medication currently, has been diet controlled. Patient is currently on an ACE inhibitor/ARB. Patient has not seen an ophthalmologist this year. Patient denies any issues with their feet. The symptom started onset as an adult hypertension and hyperlipidemia and hypothyroidism ARE RELATED TO DM   Relevant past medical, surgical, family and social history reviewed and updated as indicated. Interim medical history since our last visit reviewed. Allergies and medications reviewed and updated.  Review of Systems  Constitutional: Negative for chills and fever.  Eyes: Negative for visual disturbance.  Respiratory: Negative for  shortness of breath and wheezing.   Cardiovascular: Negative for chest pain and leg swelling.  Musculoskeletal: Negative for back pain and gait problem.  Skin: Negative for rash.  Neurological: Negative for dizziness, weakness and light-headedness.  All other systems reviewed and are negative.   Per HPI unless specifically indicated above   Allergies as of 02/15/2020      Reactions   Fenofibrate Cough   Meloxicam    Unsure but thinks it gave him a rash   Niacin And Related Itching, Rash      Medication List       Accurate as of February 15, 2020  9:57 AM. If you have any questions, ask your nurse or doctor.        amLODipine 5 MG tablet Commonly known as: NORVASC Take 1.5 tablets (7.5 mg total) by mouth daily. What changed: See the new instructions. Changed by: Fransisca Kaufmann Dwayne Begay, MD   CENTRUM SILVER 50+MEN PO Take 1 tablet by mouth daily.   cholecalciferol 1000 units tablet Commonly known as: VITAMIN D Take 2,000 Units by mouth daily.   CITRACAL + D PO Take by mouth daily.   cyanocobalamin 1000 MCG/ML injection Commonly known as: (VITAMIN B-12) INJECT 1 ML INTRAMUSCULARLY EVERY 14 DAYS   Fish Oil 1200 MG Caps Take 2 capsules by mouth daily.   levothyroxine 75 MCG tablet Commonly known as: Euthyrox Take 1 tablet (75 mcg total) by mouth daily. What changed: how much to take Changed by: Fransisca Kaufmann Koren Sermersheim, MD   losartan 100 MG tablet Commonly known as: COZAAR Take 1 tablet (100 mg total) by  mouth daily.   polyethylene glycol 17 g packet Commonly known as: MIRALAX / GLYCOLAX Take 17 g by mouth at bedtime.   pravastatin 20 MG tablet Commonly known as: PRAVACHOL Take 1 tablet (20 mg total) by mouth daily.        Objective:   BP 128/64   Pulse 71   Ht _0  (1.854 m)   Wt 184 lb (83.5 kg)   SpO2 99%   BMI 24.28 kg/m   Wt Readings from Last 3 Encounters:  02/15/20 184 lb (83.5 kg)  02/06/20 180 lb 5.4 oz (81.8 kg)  07/25/19 180 lb 4 oz (81.8  kg)    Physical Exam Vitals and nursing note reviewed.  Constitutional:      General: He is not in acute distress.    Appearance: He is well-developed and well-nourished. He is not diaphoretic.  Eyes:     General: No scleral icterus.    Extraocular Movements: EOM normal.     Conjunctiva/sclera: Conjunctivae normal.  Neck:     Thyroid: No thyromegaly.  Cardiovascular:     Rate and Rhythm: Normal rate and regular rhythm.     Pulses: Intact distal pulses.     Heart sounds: Normal heart sounds. No murmur heard.   Pulmonary:     Effort: Pulmonary effort is normal. No respiratory distress.     Breath sounds: Normal breath sounds. No wheezing.  Musculoskeletal:        General: No edema. Normal range of motion.     Cervical back: Neck supple.  Lymphadenopathy:     Cervical: No cervical adenopathy.  Skin:    General: Skin is warm and dry.     Findings: No rash.  Neurological:     Mental Status: He is alert and oriented to person, place, and time.     Coordination: Coordination normal.  Psychiatric:        Mood and Affect: Mood and affect normal.        Behavior: Behavior normal.       Assessment & Plan:   Problem List Items Addressed This Visit      Cardiovascular and Mediastinum   HTN (hypertension)   Relevant Medications   pravastatin (PRAVACHOL) 20 MG tablet   losartan (COZAAR) 100 MG tablet   amLODipine (NORVASC) 5 MG tablet   Other Relevant Orders   CMP14+EGFR     Endocrine   Hypothyroid   Relevant Medications   levothyroxine (EUTHYROX) 75 MCG tablet   Other Relevant Orders   TSH     Other   HLD (hyperlipidemia)   Relevant Medications   pravastatin (PRAVACHOL) 20 MG tablet   losartan (COZAAR) 100 MG tablet   amLODipine (NORVASC) 5 MG tablet   Other Relevant Orders   Lipid panel   Prediabetes - Primary   Relevant Orders   CBC with Differential/Platelet   CMP14+EGFR   B12 deficiency   Relevant Orders   Vitamin B12    Other Visit Diagnoses     Essential hypertension       Relevant Medications   pravastatin (PRAVACHOL) 20 MG tablet   losartan (COZAAR) 100 MG tablet   amLODipine (NORVASC) 5 MG tablet   Other Relevant Orders   CBC with Differential/Platelet      Continue current medication, no changes.  Blood pressure and everything seems to be doing well.  We will check blood work today. Follow up plan: Return in about 6 months (around 08/14/2020), or if symptoms worsen or fail to  improve, for Hypertension and hyperlipidemia.  Counseling provided for all of the vaccine components Orders Placed This Encounter  Procedures  . CBC with Differential/Platelet  . CMP14+EGFR  . Lipid panel  . TSH  . Vitamin B12    Caryl Pina, MD [facility] 02/15/2020, 9:57 AM

## 2020-02-16 LAB — CBC WITH DIFFERENTIAL/PLATELET
Basophils Absolute: 0.1 10*3/uL (ref 0.0–0.2)
Basos: 1 %
EOS (ABSOLUTE): 0.4 10*3/uL (ref 0.0–0.4)
Eos: 4 %
Hematocrit: 43.1 % (ref 37.5–51.0)
Hemoglobin: 14.7 g/dL (ref 13.0–17.7)
Immature Grans (Abs): 0 10*3/uL (ref 0.0–0.1)
Immature Granulocytes: 0 %
Lymphocytes Absolute: 1.3 10*3/uL (ref 0.7–3.1)
Lymphs: 16 %
MCH: 30.7 pg (ref 26.6–33.0)
MCHC: 34.1 g/dL (ref 31.5–35.7)
MCV: 90 fL (ref 79–97)
Monocytes Absolute: 0.7 10*3/uL (ref 0.1–0.9)
Monocytes: 8 %
Neutrophils Absolute: 5.7 10*3/uL (ref 1.4–7.0)
Neutrophils: 71 %
Platelets: 318 10*3/uL (ref 150–450)
RBC: 4.79 x10E6/uL (ref 4.14–5.80)
RDW: 13.1 % (ref 11.6–15.4)
WBC: 8.1 10*3/uL (ref 3.4–10.8)

## 2020-02-16 LAB — CMP14+EGFR
ALT: 16 IU/L (ref 0–44)
AST: 21 IU/L (ref 0–40)
Albumin/Globulin Ratio: 2 (ref 1.2–2.2)
Albumin: 4.5 g/dL (ref 3.6–4.6)
Alkaline Phosphatase: 125 IU/L — ABNORMAL HIGH (ref 44–121)
BUN/Creatinine Ratio: 10 (ref 10–24)
BUN: 14 mg/dL (ref 8–27)
Bilirubin Total: 0.6 mg/dL (ref 0.0–1.2)
CO2: 24 mmol/L (ref 20–29)
Calcium: 10 mg/dL (ref 8.6–10.2)
Chloride: 103 mmol/L (ref 96–106)
Creatinine, Ser: 1.35 mg/dL — ABNORMAL HIGH (ref 0.76–1.27)
GFR calc Af Amer: 56 mL/min/{1.73_m2} — ABNORMAL LOW (ref >59)
GFR calc non Af Amer: 48 mL/min/{1.73_m2} — ABNORMAL LOW (ref >59)
Globulin, Total: 2.2 g/dL (ref 1.5–4.5)
Glucose: 99 mg/dL (ref 65–99)
Potassium: 4.4 mmol/L (ref 3.5–5.2)
Sodium: 142 mmol/L (ref 134–144)
Total Protein: 6.7 g/dL (ref 6.0–8.5)

## 2020-02-16 LAB — LIPID PANEL
Chol/HDL Ratio: 4.9 ratio (ref 0.0–5.0)
Cholesterol, Total: 163 mg/dL (ref 100–199)
HDL: 33 mg/dL — ABNORMAL LOW (ref >39)
LDL Chol Calc (NIH): 95 mg/dL (ref 0–99)
Triglycerides: 200 mg/dL — ABNORMAL HIGH (ref 0–149)
VLDL Cholesterol Cal: 35 mg/dL (ref 5–40)

## 2020-02-16 LAB — VITAMIN B12: Vitamin B-12: >2000 pg/mL — ABNORMAL HIGH (ref 232–1245)

## 2020-02-16 LAB — TSH: TSH: 2.11 u[IU]/mL (ref 0.450–4.500)

## 2020-03-18 DIAGNOSIS — Z1379 Encounter for other screening for genetic and chromosomal anomalies: Secondary | ICD-10-CM | POA: Diagnosis not present

## 2020-03-18 DIAGNOSIS — I10 Essential (primary) hypertension: Secondary | ICD-10-CM | POA: Diagnosis not present

## 2020-03-18 DIAGNOSIS — E7800 Pure hypercholesterolemia, unspecified: Secondary | ICD-10-CM | POA: Diagnosis not present

## 2020-03-26 ENCOUNTER — Encounter: Payer: Self-pay | Admitting: Family Medicine

## 2020-04-01 DIAGNOSIS — M79676 Pain in unspecified toe(s): Secondary | ICD-10-CM | POA: Diagnosis not present

## 2020-04-01 DIAGNOSIS — B351 Tinea unguium: Secondary | ICD-10-CM | POA: Diagnosis not present

## 2020-05-06 DIAGNOSIS — H5203 Hypermetropia, bilateral: Secondary | ICD-10-CM | POA: Diagnosis not present

## 2020-05-06 DIAGNOSIS — H2512 Age-related nuclear cataract, left eye: Secondary | ICD-10-CM | POA: Diagnosis not present

## 2020-05-06 DIAGNOSIS — Z961 Presence of intraocular lens: Secondary | ICD-10-CM | POA: Diagnosis not present

## 2020-05-06 DIAGNOSIS — H524 Presbyopia: Secondary | ICD-10-CM | POA: Diagnosis not present

## 2020-05-06 DIAGNOSIS — H52222 Regular astigmatism, left eye: Secondary | ICD-10-CM | POA: Diagnosis not present

## 2020-06-20 DIAGNOSIS — M170 Bilateral primary osteoarthritis of knee: Secondary | ICD-10-CM | POA: Diagnosis not present

## 2020-06-25 ENCOUNTER — Other Ambulatory Visit: Payer: Self-pay

## 2020-06-25 ENCOUNTER — Encounter (HOSPITAL_COMMUNITY): Payer: Self-pay | Admitting: Emergency Medicine

## 2020-06-25 ENCOUNTER — Emergency Department (HOSPITAL_COMMUNITY)
Admission: EM | Admit: 2020-06-25 | Discharge: 2020-06-25 | Disposition: A | Discharge status: Home / Self Care | Payer: Medicare Other | Attending: Emergency Medicine | Admitting: Emergency Medicine

## 2020-06-25 DIAGNOSIS — Z85828 Personal history of other malignant neoplasm of skin: Secondary | ICD-10-CM | POA: Diagnosis not present

## 2020-06-25 DIAGNOSIS — X58XXXA Exposure to other specified factors, initial encounter: Secondary | ICD-10-CM | POA: Insufficient documentation

## 2020-06-25 DIAGNOSIS — Z79899 Other long term (current) drug therapy: Secondary | ICD-10-CM | POA: Diagnosis not present

## 2020-06-25 DIAGNOSIS — I10 Essential (primary) hypertension: Secondary | ICD-10-CM | POA: Insufficient documentation

## 2020-06-25 DIAGNOSIS — E039 Hypothyroidism, unspecified: Secondary | ICD-10-CM | POA: Insufficient documentation

## 2020-06-25 DIAGNOSIS — Z87891 Personal history of nicotine dependence: Secondary | ICD-10-CM | POA: Diagnosis not present

## 2020-06-25 DIAGNOSIS — Z20822 Contact with and (suspected) exposure to covid-19: Secondary | ICD-10-CM | POA: Insufficient documentation

## 2020-06-25 DIAGNOSIS — T18128A Food in esophagus causing other injury, initial encounter: Secondary | ICD-10-CM | POA: Diagnosis not present

## 2020-06-25 LAB — CBC WITH DIFFERENTIAL/PLATELET
Abs Immature Granulocytes: 0.03 10*3/uL (ref 0.00–0.07)
Basophils Absolute: 0.1 10*3/uL (ref 0.0–0.1)
Basophils Relative: 1 %
Eosinophils Absolute: 0.3 10*3/uL (ref 0.0–0.5)
Eosinophils Relative: 3 %
HCT: 42.3 % (ref 39.0–52.0)
Hemoglobin: 14.1 g/dL (ref 13.0–17.0)
Immature Granulocytes: 0 %
Lymphocytes Relative: 13 %
Lymphs Abs: 1.4 10*3/uL (ref 0.7–4.0)
MCH: 31.1 pg (ref 26.0–34.0)
MCHC: 33.3 g/dL (ref 30.0–36.0)
MCV: 93.2 fL (ref 80.0–100.0)
Monocytes Absolute: 0.9 10*3/uL (ref 0.1–1.0)
Monocytes Relative: 8 %
Neutro Abs: 7.8 10*3/uL — ABNORMAL HIGH (ref 1.7–7.7)
Neutrophils Relative %: 75 %
Platelets: 301 10*3/uL (ref 150–400)
RBC: 4.54 MIL/uL (ref 4.22–5.81)
RDW: 13.2 % (ref 11.5–15.5)
WBC: 10.4 10*3/uL (ref 4.0–10.5)
nRBC: 0 % (ref 0.0–0.2)

## 2020-06-25 LAB — RESP PANEL BY RT-PCR (FLU A&B, COVID) ARPGX2
Influenza A by PCR: NEGATIVE
Influenza B by PCR: NEGATIVE
SARS Coronavirus 2 by RT PCR: NEGATIVE

## 2020-06-25 LAB — BASIC METABOLIC PANEL
Anion gap: 8 (ref 5–15)
BUN: 19 mg/dL (ref 8–23)
CO2: 27 mmol/L (ref 22–32)
Calcium: 9.3 mg/dL (ref 8.9–10.3)
Chloride: 105 mmol/L (ref 98–111)
Creatinine, Ser: 1.3 mg/dL — ABNORMAL HIGH (ref 0.61–1.24)
GFR, Estimated: 54 mL/min — ABNORMAL LOW (ref >60)
Glucose, Bld: 137 mg/dL — ABNORMAL HIGH (ref 70–99)
Potassium: 3.5 mmol/L (ref 3.5–5.1)
Sodium: 140 mmol/L (ref 135–145)

## 2020-06-25 MED ORDER — FAMOTIDINE 20 MG PO TABS: 20.0000 mg | ORAL_TABLET | Freq: Two times a day (BID) | ORAL | 1 refills | Status: DC

## 2020-06-25 MED ORDER — GLUCAGON HCL RDNA (DIAGNOSTIC) 1 MG IJ SOLR
1.0000 mg | Freq: Once | INTRAMUSCULAR | Status: AC
  Administered 2020-06-25: 22:00:00 1 mg via INTRAVENOUS
  Filled 2020-06-25: qty 1

## 2020-06-25 NOTE — ED Triage Notes (Signed)
Pt was eating dinner when he began to choke. Daughter states that pt is unable to keep anything down.

## 2020-06-25 NOTE — ED Provider Notes (Signed)
[facility] EMERGENCY DEPARTMENT Provider Note   CSN: [id number] Arrival date & time: 06/25/20  2013     History Chief Complaint  Patient presents with   Foreign body in throat    James Thomas is a 86 y.o. male.  Pt's daughter reports pt choked on an omelet tonight.  Omelet had ham in it.  Pt unable to tolerate fluids since choking.  Pt had similar 2 years ago that showed some abnormality on swallowing study   The history is provided by the patient. No language interpreter was used.  Pt vomited after trying to drink water.  Pt tried to drink pepsi without relief.      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 08/17/2012   Osteoporosis 08/17/2012    Past Surgical History:  Procedure Laterality Date   BACK SURGERY  1979;1985   slipped disc;ruptured disc   CATARACT EXTRACTION W/PHACO  02/01/2011   Procedure: CATARACT EXTRACTION PHACO AND INTRAOCULAR LENS PLACEMENT ([place]);  Surgeon: Tonny Branch, MD;  Location: [facility];  Service: Ophthalmology;  Laterality: Right;  CDE:18.53       Family History  Problem Relation Age of Onset   Diabetes Father    Heart attack Maternal Grandmother    Heart attack Brother 23   Anesthesia problems Neg Hx    Hypotension Neg Hx    Malignant hyperthermia Neg Hx    Pseudochol deficiency Neg Hx    Colon cancer Neg Hx     Social History   Tobacco Use    Smoking status: Former    Packs/day: 1.00    Years: 10.00    Pack years: 10.00    Types: Cigarettes    Quit date: 01/27/1972    Years since quitting: 48.4   Smokeless tobacco: Never  Vaping Use   Vaping Use: Never used  Substance Use Topics   Alcohol use: No   Drug use: No    Home Medications Prior to Admission medications   Medication Sig Start Date End Date Taking? Authorizing Provider  amLODipine (NORVASC) 5 MG tablet Take 1.5 tablets (7.5 mg total) by mouth daily. 02/15/20   Dettinger, Fransisca Kaufmann, MD  Calcium Citrate-Vitamin D (CITRACAL + D PO) Take by mouth daily.    [provider]  cholecalciferol (VITAMIN D) 1000 UNITS tablet Take 2,000 Units by mouth daily.     [provider]  cyanocobalamin (,VITAMIN B-12,) 1000 MCG/ML injection INJECT 1 ML INTRAMUSCULARLY EVERY 14 DAYS 02/15/20   Dettinger, Fransisca Kaufmann, MD  levothyroxine (EUTHYROX) 75 MCG tablet Take 1 tablet (75 mcg total) by mouth daily. 02/15/20   Dettinger, Fransisca Kaufmann, MD  losartan (COZAAR) 100 MG tablet Take 1 tablet (100 mg total) by mouth daily. 02/15/20   Dettinger, Fransisca Kaufmann, MD  Multiple Vitamins-Minerals (CENTRUM SILVER 50+MEN PO) Take 1 tablet by mouth daily.    [provider]  Omega-3 Fatty Acids (FISH OIL) 1200 MG CAPS Take 2 capsules by mouth daily.    [provider]  polyethylene glycol (MIRALAX / GLYCOLAX) packet Take 17 g by mouth at bedtime.    [provider]  pravastatin (PRAVACHOL) 20 MG tablet Take 1 tablet (20 mg total) by mouth daily. 02/15/20   Dettinger, Fransisca Kaufmann, MD    Allergies    Fenofibrate, Meloxicam, and Niacin and related  Review of Systems   Review of Systems  Constitutional:  Negative for chills.  HENT:  Negative for sore throat.   Respiratory:  Negative for cough.   Gastrointestinal:  Positive for vomiting.  Skin:  Negative for color change and rash.  Neurological:  Negative for seizures.  All other systems reviewed and are  negative.  Physical Exam Updated Vital Signs BP 140/69   Pulse 70   Temp 98 F (36.7 C)   Resp 18   Ht 6\' 1"  (1.854 m)   Wt 83.5 kg   SpO2 98%   BMI 24.29 kg/m   Physical Exam Vitals and nursing note reviewed.  Constitutional:      Appearance: He is well-developed.  HENT:     Head: Normocephalic.  Cardiovascular:     Rate and Rhythm: Normal rate.  Pulmonary:     Effort: Pulmonary effort is normal.  Abdominal:     General: There is no distension.  Musculoskeletal:        General: Normal range of motion.     Cervical back: Normal range of motion.  Neurological:     Mental Status: He is alert and oriented to person, place, and time.  Psychiatric:        Mood and Affect: Mood normal.    ED Results / Procedures / Treatments   Labs (all labs ordered are listed, but only abnormal results are displayed) Labs Reviewed - No data to display  EKG None  Radiology No results found.  Procedures Procedures   Medications Ordered in ED Medications - No data to display  ED Course  I have reviewed the triage vital signs and the nursing notes.  Pertinent labs & imaging results that were available during my care of the patient were reviewed by me and considered in my medical decision making (see chart for details).    MDM Rules/Calculators/A&P                          MDM:  Pt unable to tolerate water.  Pt given glucagon 1mg  IV.  Pt reports sensation of fullness resolved.  Pt tolerated 10 ounces of water.   I advised schedule to see Dr. Laural Golden for evaluation.  Pt given rx for pepcid.  I suspect pt has reflux.   Final Clinical Impression(s) / ED Diagnoses Final diagnoses:  Food impaction of esophagus, initial encounter    Rx / DC Orders ED Discharge Orders     None     An After Visit Summary was printed and given to the patient.    Sidney Ace 06/25/20 2345    Luna Fuse, MD 07/04/20 2342

## 2020-06-25 NOTE — ED Notes (Signed)
Able to tolerate po after glucagon

## 2020-06-26 ENCOUNTER — Encounter: Payer: Self-pay | Admitting: Family Medicine

## 2020-06-30 ENCOUNTER — Encounter: Payer: Self-pay | Admitting: Family Medicine

## 2020-06-30 ENCOUNTER — Other Ambulatory Visit: Payer: Self-pay

## 2020-06-30 ENCOUNTER — Ambulatory Visit (INDEPENDENT_AMBULATORY_CARE_PROVIDER_SITE_OTHER): Payer: Medicare Other | Admitting: Family Medicine

## 2020-06-30 VITALS — BP 138/78 | HR 72 | Ht 73.0 in | Wt 187.0 lb

## 2020-06-30 DIAGNOSIS — R1310 Dysphagia, unspecified: Secondary | ICD-10-CM | POA: Diagnosis not present

## 2020-06-30 DIAGNOSIS — D649 Anemia, unspecified: Secondary | ICD-10-CM | POA: Diagnosis not present

## 2020-06-30 NOTE — Progress Notes (Signed)
BP 138/78   Pulse 72   Ht 6\' 1"  (1.854 m)   Wt 187 lb (84.8 kg)   SpO2 99%   BMI 24.67 kg/m    Subjective:   Patient ID: James Thomas, male    DOB: [birth year], 86 y.o.   MRN: [id number]  HPI: James Thomas is a 86 y.o. male presenting on 06/30/2020 for Dysphagia (Recent seen in ER)   HPI Dysphagia and ER visit follow-up Patient is coming in today for ER visit follow-up for dysphagia.  Patient was seen in the ED on 06/23/2020 and found to have dysphagia and food impaction.  Patient was initially not able to tolerate water in the negative some glucagon and then he was able to tolerate water and was recommended to follow-up with his gastroenterologist Dr. Laural Golden.  He was also given a prescription for Pepcid.  He has had no problems since leaving ER  Relevant past medical, surgical, family and social history reviewed and updated as indicated. Interim medical history since our last visit reviewed. Allergies and medications reviewed and updated.  Review of Systems  Constitutional:  Negative for chills and fever.  HENT:  Positive for trouble swallowing.   Respiratory:  Negative for shortness of breath and wheezing.   Cardiovascular:  Negative for chest pain and leg swelling.  Gastrointestinal:  Negative for abdominal distention, abdominal pain, constipation, nausea and vomiting.  Musculoskeletal:  Negative for back pain and gait problem.  Skin:  Negative for rash.  All other systems reviewed and are negative.  Per HPI unless specifically indicated above   Allergies as of 06/30/2020       Reactions   Fenofibrate Cough   Meloxicam    Unsure but thinks it gave him a rash   Niacin And Related Itching, Rash        Medication List        Accurate as of June 30, 2020  4:10 PM. If you have any questions, ask your nurse or doctor.          amLODipine 5 MG tablet Commonly known as: NORVASC Take 1.5 tablets (7.5 mg total) by mouth daily.   CENTRUM SILVER 50+MEN PO Take  1 tablet by mouth daily.   cholecalciferol 1000 units tablet Commonly known as: VITAMIN D Take 2,000 Units by mouth daily.   CITRACAL + D PO Take by mouth daily.   cyanocobalamin 1000 MCG/ML injection Commonly known as: (VITAMIN B-12) INJECT 1 ML INTRAMUSCULARLY EVERY 14 DAYS   famotidine 20 MG tablet Commonly known as: PEPCID Take 1 tablet (20 mg total) by mouth 2 (two) times daily.   Fish Oil 1200 MG Caps Take 2 capsules by mouth daily.   levothyroxine 75 MCG tablet Commonly known as: Euthyrox Take 1 tablet (75 mcg total) by mouth daily.   losartan 100 MG tablet Commonly known as: COZAAR Take 1 tablet (100 mg total) by mouth daily.   polyethylene glycol 17 g packet Commonly known as: MIRALAX / GLYCOLAX Take 17 g by mouth at bedtime.   pravastatin 20 MG tablet Commonly known as: PRAVACHOL Take 1 tablet (20 mg total) by mouth daily.         Objective:   BP 138/78   Pulse 72   Ht 6\' 1"  (1.854 m)   Wt 187 lb (84.8 kg)   SpO2 99%   BMI 24.67 kg/m   Wt Readings from Last 3 Encounters:  06/30/20 187 lb (84.8 kg)  06/25/20 184 lb 1.4 oz (  83.5 kg)  02/15/20 184 lb (83.5 kg)    Physical Exam Vitals and nursing note reviewed.  Constitutional:      General: He is not in acute distress.    Appearance: He is well-developed. He is not diaphoretic.  Eyes:     General: No scleral icterus.    Conjunctiva/sclera: Conjunctivae normal.  Neck:     Thyroid: No thyromegaly.  Cardiovascular:     Rate and Rhythm: Normal rate and regular rhythm.     Heart sounds: Normal heart sounds. No murmur heard. Pulmonary:     Effort: Pulmonary effort is normal. No respiratory distress.     Breath sounds: Normal breath sounds. No wheezing.  Abdominal:     General: Abdomen is flat. Bowel sounds are normal. There is no distension.     Tenderness: There is no abdominal tenderness. There is no guarding or rebound.  Musculoskeletal:     Cervical back: Neck supple.  Lymphadenopathy:      Cervical: No cervical adenopathy.  Skin:    General: Skin is warm and dry.     Findings: No rash.  Neurological:     Mental Status: He is alert and oriented to person, place, and time.     Coordination: Coordination normal.  Psychiatric:        Behavior: Behavior normal.      Assessment & Plan:   Problem List Items Addressed This Visit   None Visit Diagnoses     Dysphagia, unspecified type    -  Primary   Relevant Orders   Ambulatory referral to Gastroenterology       Patient not having any issues now but has had this recurrently although this was more severe than his ever had before, placed referral to Dr. Melony Overly in [place] Follow up plan: Return if symptoms worsen or fail to improve.  Counseling provided for all of the vaccine components Orders Placed This Encounter  Procedures   Ambulatory referral to Gastroenterology     Caryl Pina, MD [facility] 06/30/2020, 4:10 PM

## 2020-06-30 NOTE — Addendum Note (Signed)
Addended by: Caryl Pina on: 06/30/2020 04:22 PM   Modules accepted: Orders

## 2020-07-01 LAB — CBC WITH DIFFERENTIAL/PLATELET
Basophils Absolute: 0.1 10*3/uL (ref 0.0–0.2)
Basos: 1 %
EOS (ABSOLUTE): 0.3 10*3/uL (ref 0.0–0.4)
Eos: 4 %
Hematocrit: 41.8 % (ref 37.5–51.0)
Hemoglobin: 14.2 g/dL (ref 13.0–17.7)
Immature Grans (Abs): 0 10*3/uL (ref 0.0–0.1)
Immature Granulocytes: 0 %
Lymphocytes Absolute: 1.6 10*3/uL (ref 0.7–3.1)
Lymphs: 19 %
MCH: 30.6 pg (ref 26.6–33.0)
MCHC: 34 g/dL (ref 31.5–35.7)
MCV: 90 fL (ref 79–97)
Monocytes Absolute: 0.8 10*3/uL (ref 0.1–0.9)
Monocytes: 10 %
Neutrophils Absolute: 5.8 10*3/uL (ref 1.4–7.0)
Neutrophils: 66 %
Platelets: 322 10*3/uL (ref 150–450)
RBC: 4.64 x10E6/uL (ref 4.14–5.80)
RDW: 13.1 % (ref 11.6–15.4)
WBC: 8.7 10*3/uL (ref 3.4–10.8)

## 2020-07-01 LAB — CMP14+EGFR
ALT: 16 IU/L (ref 0–44)
AST: 16 IU/L (ref 0–40)
Albumin/Globulin Ratio: 2 (ref 1.2–2.2)
Albumin: 4.4 g/dL (ref 3.6–4.6)
Alkaline Phosphatase: 113 IU/L (ref 44–121)
BUN/Creatinine Ratio: 11 (ref 10–24)
BUN: 12 mg/dL (ref 8–27)
Bilirubin Total: 0.6 mg/dL (ref 0.0–1.2)
CO2: 27 mmol/L (ref 20–29)
Calcium: 9.4 mg/dL (ref 8.6–10.2)
Chloride: 104 mmol/L (ref 96–106)
Creatinine, Ser: 1.07 mg/dL (ref 0.76–1.27)
Globulin, Total: 2.2 g/dL (ref 1.5–4.5)
Glucose: 108 mg/dL — ABNORMAL HIGH (ref 65–99)
Potassium: 4.3 mmol/L (ref 3.5–5.2)
Sodium: 145 mmol/L — ABNORMAL HIGH (ref 134–144)
Total Protein: 6.6 g/dL (ref 6.0–8.5)
eGFR: 68 mL/min/{1.73_m2} (ref >59)

## 2020-07-02 ENCOUNTER — Encounter (INDEPENDENT_AMBULATORY_CARE_PROVIDER_SITE_OTHER): Payer: Self-pay | Admitting: *Deleted

## 2020-07-02 ENCOUNTER — Encounter: Payer: Self-pay | Admitting: Family Medicine

## 2020-07-03 ENCOUNTER — Other Ambulatory Visit: Payer: Self-pay

## 2020-07-03 DIAGNOSIS — R1310 Dysphagia, unspecified: Secondary | ICD-10-CM

## 2020-07-07 DIAGNOSIS — Z20822 Contact with and (suspected) exposure to covid-19: Secondary | ICD-10-CM | POA: Diagnosis not present

## 2020-07-10 ENCOUNTER — Encounter: Payer: Self-pay | Admitting: Gastroenterology

## 2020-07-21 ENCOUNTER — Other Ambulatory Visit: Payer: Self-pay | Admitting: Family Medicine

## 2020-08-07 ENCOUNTER — Ambulatory Visit (INDEPENDENT_AMBULATORY_CARE_PROVIDER_SITE_OTHER): Payer: Medicare Other | Admitting: Gastroenterology

## 2020-08-07 ENCOUNTER — Encounter: Payer: Self-pay | Admitting: Gastroenterology

## 2020-08-07 VITALS — BP 110/60 | HR 71 | Ht 73.0 in | Wt 176.0 lb

## 2020-08-07 DIAGNOSIS — R1310 Dysphagia, unspecified: Secondary | ICD-10-CM

## 2020-08-07 NOTE — Progress Notes (Signed)
08/07/2020 James Thomas [id number] September 20, 1934   HISTORY OF PRESENT ILLNESS: This is a pleasant 86 year old male who is here today with his daughter for evaluation of dysphagia.  He is new to our office.  His daughter reports that he has had some intermittent issues with dysphagia for a while.  He had a modified barium swallow study performed in February 2020 that was fairly unremarkable, but they gave him some modifications and recommendations to utilize while eating.  He has been following those and has done well.  Then in June he had an episode of food getting stuck.  He was eating an omelette that had peppers and onions and ham in it.  They could not get the food to pass so they eventually went to the emergency department as liquids were even coming back up.  Once he got to the emergency department he was given a dose of glucagon and that allowed the food to pass.  He was told to follow-up with GI.  He has not had any other issues with getting food stuck since that time.  They placed him on Pepcid 20 mg twice daily, which he has continued, but he reports absolutely no history of any heartburn/reflux type symptoms.  Past Medical History:  Diagnosis Date   Arthritis    B12 deficiency    On injections   Hypercholesteremia    Hypertension    Hypothyroidism    Osteoporosis    Past Surgical History:  Procedure Laterality Date   BACK SURGERY  1979;1985   slipped disc;ruptured disc   CATARACT EXTRACTION W/PHACO  02/01/2011   Procedure: CATARACT EXTRACTION PHACO AND INTRAOCULAR LENS PLACEMENT ([facility]);  Surgeon: Tonny Branch, MD;  Location: [facility];  Service: Ophthalmology;  Laterality: Right;  CDE:18.53    reports that he quit smoking about 48 years ago. His smoking use included cigarettes. He has a 10.00 pack-year smoking history. He has never used smokeless tobacco. He reports that he does not drink alcohol and does not use drugs. family history includes Diabetes in his father; Heart attack in  his maternal grandmother; Heart attack (age of onset: 2) in his brother. Allergies  Allergen Reactions   Fenofibrate Cough   Meloxicam     Unsure but thinks it gave him a rash   Niacin And Related Itching and Rash      Outpatient Encounter Medications as of 08/07/2020  Medication Sig   amLODipine (NORVASC) 5 MG tablet Take 1.5 tablets (7.5 mg total) by mouth daily.   Calcium Citrate-Vitamin D (CITRACAL + D PO) Take by mouth daily.   cholecalciferol (VITAMIN D) 1000 UNITS tablet Take 2,000 Units by mouth daily.    cyanocobalamin (,VITAMIN B-12,) 1000 MCG/ML injection INJECT 1 ML INTRAMUSCULARLY EVERY 14 DAYS   famotidine (PEPCID) 20 MG tablet Take 1 tablet (20 mg total) by mouth 2 (two) times daily.   levothyroxine (EUTHYROX) 75 MCG tablet Take 1 tablet (75 mcg total) by mouth daily.   losartan (COZAAR) 100 MG tablet Take 1 tablet (100 mg total) by mouth daily.   Multiple Vitamins-Minerals (CENTRUM SILVER 50+MEN PO) Take 1 tablet by mouth daily.   Omega-3 Fatty Acids (FISH OIL) 1200 MG CAPS Take 2 capsules by mouth daily.   polyethylene glycol (MIRALAX / GLYCOLAX) packet Take 17 g by mouth at bedtime.   pravastatin (PRAVACHOL) 20 MG tablet Take 1 tablet (20 mg total) by mouth daily.   No facility-administered encounter medications on file as of 08/07/2020.  REVIEW OF SYSTEMS  : All other systems reviewed and negative except where noted in the History of Present Illness.   PHYSICAL EXAM: BP 110/60   Pulse 71   Ht '6\' 1"'$  (1.854 m)   Wt 176 lb (79.8 kg)   SpO2 97%   BMI 23.22 kg/m  General: Well developed white male in no acute distress Head: Normocephalic and atraumatic Eyes:  Sclerae anicteric, conjunctiva pink. Ears: Normal auditory acuity Lungs: Clear throughout to auscultation; no W/R/R. Heart: Regular rate and rhythm; no M/R/G. Abdomen: Soft, non-distended.  BS present.  Non-tender. Musculoskeletal: Symmetrical with no gross deformities  Skin: No lesions on visible  extremities Extremities: No edema  Neurological: Alert oriented x 4, grossly non-focal Psychological:  Alert and cooperative. Normal mood and affect  ASSESSMENT AND PLAN: *Dysphagia: Very rare, occasional dysphagia.  Had an episode in June that took him to the emergency department.  Food passed with glucagon.  He had a modified barium swallow study in 2020 that was fairly unremarkable.  He has had no further episodes of dysphagia since June.  He takes a lot of caution while eating since his swallow study in 2020 and has done well with those modifications.  Suspect more of an esophageal dysmotility issue rather than a structural issue.  We will plan for barium esophagram to rule out structural issue.  He is currently on Pepcid 20 mg twice a day just since his ER visit, but reports absolutely no symptoms of heartburn/reflux so we can likely discontinue that pending results of the esophagram.  CC:  Dettinger, Fransisca Kaufmann, MD

## 2020-08-07 NOTE — Patient Instructions (Signed)
If you are age 84 or older, your body mass index should be between 23-30. Your Body mass index is 23.22 kg/m. If this is out of the aforementioned range listed, please consider follow up with your Primary Care Provider. __________________________________________________________  The [facility] providers would like to encourage you to use [facility] to communicate with providers for non-urgent requests or questions.  Due to long hold times on the telephone, sending your provider a message by [facility] may be a faster and more efficient way to get a response.  Please allow 48 business hours for a response.  Please remember that this is for non-urgent requests.   You have been scheduled for a Barium Esophogram at [facility] (1st floor of the hospital) on 08/18/2020 at 10:30 am. Please arrive 15 minutes prior to your appointment for registration. Make certain not to have anything to eat or drink 3 hours prior to your test. If you need to reschedule for any reason, please contact radiology at [phone] to do so. __________________________________________________________________ A barium swallow is an examination that concentrates on views of the esophagus. This tends to be a double contrast exam (barium and two liquids which, when combined, create a gas to distend the wall of the oesophagus) or single contrast (non-ionic iodine based). The study is usually tailored to your symptoms so a good history is essential. Attention is paid during the study to the form, structure and configuration of the esophagus, looking for functional disorders (such as aspiration, dysphagia, achalasia, motility and reflux) EXAMINATION You may be asked to change into a gown, depending on the type of swallow being performed. A radiologist and radiographer will perform the procedure. The radiologist will advise you of the type of contrast selected for your procedure and direct you during the exam. You will be asked to stand,  sit or lie in several different positions and to hold a small amount of fluid in your mouth before being asked to swallow while the imaging is performed .In some instances you may be asked to swallow barium coated marshmallows to assess the motility of a solid food bolus. The exam can be recorded as a digital or video fluoroscopy procedure. POST PROCEDURE It will take 1-2 days for the barium to pass through your system. To facilitate this, it is important, unless otherwise directed, to increase your fluids for the next 24-48hrs and to resume your normal diet.  This test typically takes about 30 minutes to perform. ____________________________________________________________  Follow up pending the results of your Barium Swallow.  Thank you for entrusting me with your care and choosing [facility].  Alonza Bogus, PA-C

## 2020-08-07 NOTE — Progress Notes (Signed)
Attending Physician's Attestation   I have reviewed the chart.   I agree with the Advanced Practitioner's note, impression, and recommendations with any updates as below. This patient is feeling well reasonable to begin work-up with barium swallow. If the barium swallow shows a structural abnormality then diagnostic endoscopy will be strongly recommended.   If the patient continues to do well and a barium swallow is negative then likelihood is okay for patient to hold on endoscopic evaluation. However, if patient has recurrent symptoms then a structural evaluation via diagnostic EGD and empiric dilation should strongly be considered at that time.   Justice Britain, MD [facility] Advanced Endoscopy Office # [id number]

## 2020-08-14 ENCOUNTER — Ambulatory Visit: Payer: Medicare Other | Admitting: Family Medicine

## 2020-08-18 ENCOUNTER — Ambulatory Visit (HOSPITAL_COMMUNITY): Admission: RE | Admit: 2020-08-18 | Payer: Medicare Other | Source: Ambulatory Visit

## 2020-08-26 DIAGNOSIS — M79676 Pain in unspecified toe(s): Secondary | ICD-10-CM | POA: Diagnosis not present

## 2020-08-26 DIAGNOSIS — B351 Tinea unguium: Secondary | ICD-10-CM | POA: Diagnosis not present

## 2020-08-27 DIAGNOSIS — Z20822 Contact with and (suspected) exposure to covid-19: Secondary | ICD-10-CM | POA: Diagnosis not present

## 2020-09-01 ENCOUNTER — Other Ambulatory Visit: Payer: Self-pay | Admitting: Family Medicine

## 2020-09-02 ENCOUNTER — Encounter: Payer: Self-pay | Admitting: Family Medicine

## 2020-09-02 ENCOUNTER — Other Ambulatory Visit: Payer: Self-pay

## 2020-09-02 ENCOUNTER — Ambulatory Visit (HOSPITAL_COMMUNITY)
Admission: RE | Admit: 2020-09-02 | Discharge: 2020-09-02 | Disposition: A | Discharge status: Home / Self Care | Payer: Medicare Other | Source: Ambulatory Visit | Attending: Gastroenterology | Admitting: Gastroenterology

## 2020-09-02 DIAGNOSIS — R1310 Dysphagia, unspecified: Secondary | ICD-10-CM | POA: Diagnosis not present

## 2020-09-03 ENCOUNTER — Encounter: Payer: Self-pay | Admitting: Family Medicine

## 2020-09-07 DIAGNOSIS — Z20822 Contact with and (suspected) exposure to covid-19: Secondary | ICD-10-CM | POA: Diagnosis not present

## 2020-09-23 ENCOUNTER — Encounter: Payer: Medicare Other | Admitting: Gastroenterology

## 2020-09-25 ENCOUNTER — Ambulatory Visit (AMBULATORY_SURGERY_CENTER): Payer: Medicare Other | Admitting: *Deleted

## 2020-09-25 ENCOUNTER — Other Ambulatory Visit: Payer: Self-pay

## 2020-09-25 VITALS — Ht 72.0 in | Wt 185.0 lb

## 2020-09-25 DIAGNOSIS — R1310 Dysphagia, unspecified: Secondary | ICD-10-CM

## 2020-09-25 NOTE — Progress Notes (Signed)
Virtual pre-visit completed over telephone.  Patient's daughter, [name] assisted. Instructions sent through [place] and email [email or web address]    No egg or soy allergy known to patient  No issues known to pt with past sedation with any surgeries or procedures Patient denies ever being told they had issues or difficulty with intubation  No FH of Malignant Hyperthermia Pt is not on diet pills Pt is not on  home 02  Pt is not on blood thinners  Pt denies issues with constipation  No A fib or A flutter  EMMI video to pt or via [place] 19 guidelines implemented in [place] today with Pt and RN   Pt is fully vaccinated  for Covid   Due to the COVID-19 pandemic we are asking patients to follow certain guidelines.  Pt aware of COVID protocols and [facility] guidelines

## 2020-10-02 ENCOUNTER — Other Ambulatory Visit: Payer: Self-pay | Admitting: Family Medicine

## 2020-10-14 DIAGNOSIS — Z20822 Contact with and (suspected) exposure to covid-19: Secondary | ICD-10-CM | POA: Diagnosis not present

## 2020-10-21 ENCOUNTER — Encounter: Payer: Self-pay | Admitting: Gastroenterology

## 2020-10-21 ENCOUNTER — Ambulatory Visit (AMBULATORY_SURGERY_CENTER): Payer: Medicare Other | Admitting: Gastroenterology

## 2020-10-21 VITALS — BP 133/81 | HR 64 | Temp 98.2°F | Resp 13 | Ht 73.0 in | Wt 185.0 lb

## 2020-10-21 DIAGNOSIS — K449 Diaphragmatic hernia without obstruction or gangrene: Secondary | ICD-10-CM | POA: Diagnosis not present

## 2020-10-21 DIAGNOSIS — K222 Esophageal obstruction: Secondary | ICD-10-CM

## 2020-10-21 DIAGNOSIS — R1310 Dysphagia, unspecified: Secondary | ICD-10-CM | POA: Diagnosis not present

## 2020-10-21 DIAGNOSIS — K319 Disease of stomach and duodenum, unspecified: Secondary | ICD-10-CM | POA: Diagnosis not present

## 2020-10-21 DIAGNOSIS — R12 Heartburn: Secondary | ICD-10-CM | POA: Diagnosis not present

## 2020-10-21 DIAGNOSIS — K229 Disease of esophagus, unspecified: Secondary | ICD-10-CM | POA: Diagnosis not present

## 2020-10-21 MED ORDER — OMEPRAZOLE 40 MG PO CPDR: 40.0000 mg | DELAYED_RELEASE_CAPSULE | Freq: Every day | ORAL | 3 refills | Status: DC

## 2020-10-21 MED ORDER — SODIUM CHLORIDE 0.9 % IV SOLN: 500.0000 mL | Freq: Once | INTRAVENOUS | Status: DC

## 2020-10-21 NOTE — Progress Notes (Signed)
Called to room to assist during endoscopic procedure.  Patient ID and intended procedure confirmed with present staff. Received instructions for my participation in the procedure from the performing physician.  

## 2020-10-21 NOTE — Progress Notes (Signed)
1003 ETCO

## 2020-10-21 NOTE — Progress Notes (Signed)
Sedate, gd SR, tolerated procedure well, VSS, report to RN 

## 2020-10-21 NOTE — Op Note (Signed)
[place] Patient Name: James Thomas Procedure Date: 10/21/2020 9:54 AM MRN: [id number] Endoscopist: Justice Britain , MD Age: 86 Referring MD:  Date of Birth: 09/25/1934 Gender: Male Account #: [id number] Procedure:                Upper GI endoscopy Indications:              Dysphagia (patient already feeling improved),                            Heartburn, Abnormal UGI series, Endoscopy to                            confirm esophageal stricture that was demonstrated                            on previous imaging study Medicines:                Monitored Anesthesia Care Procedure:                Pre-Anesthesia Assessment:                           - Prior to the procedure, a History and Physical                            was performed, and patient medications and                            allergies were reviewed. The patient's tolerance of                            previous anesthesia was also reviewed. The risks                            and benefits of the procedure and the sedation                            options and risks were discussed with the patient.                            All questions were answered, and informed consent                            was obtained. Prior Anticoagulants: The patient has                            taken no previous anticoagulant or antiplatelet                            agents. ASA Grade Assessment: III - A patient with                            severe systemic disease. After reviewing the risks  and benefits, the patient was deemed in                            satisfactory condition to undergo the procedure.                           After obtaining informed consent, the endoscope was                            passed under direct vision. Throughout the                            procedure, the patient's blood pressure, pulse, and                            oxygen saturations were monitored  continuously. The                            Endoscope was introduced through the mouth, and                            advanced to the second part of duodenum. The upper                            GI endoscopy was accomplished without difficulty.                            The patient tolerated the procedure. Scope In: Scope Out: Findings:                 No gross lesions were noted in the entire                            esophagus. Biopsies were taken with a cold forceps                            for histology to rule out EoE.                           A low-grade of narrowing Schatzki ring was found at                            the gastroesophageal junction approximately 10 mm                            in size. After the rest of the procedure was                            complete, a guidewire was placed and the scope was                            withdrawn. Dilation was performed with a Savary  dilator with mild resistance at 10 mm and 12 mm and                            moderate resistance at 14 mm. The dilation site was                            examined following endoscope reinsertion and showed                            moderate mucosal disruption, moderate improvement                            in luminal narrowing and no perforation of the ring.                           The Z-line was regular and was found 40 cm from the                            incisors.                           A 3 cm hiatal hernia was present.                           Patchy mildly erythematous mucosa without bleeding                            was found in the entire examined stomach. Biopsies                            were taken with a cold forceps for histology and                            Helicobacter pylori testing.                           No gross lesions were noted in the duodenal bulb,                            in the first portion of the duodenum and in  the                            second portion of the duodenum. Complications:            No immediate complications. Estimated Blood Loss:     Estimated blood loss was minimal. Impression:               - No gross lesions in esophagus. Biopsied.                           - Low-grade of narrowing Schatzki ring. Dilated                            with appropriate mucosal wrent to 14 mm.                           -  Z-line regular, 40 cm from the incisors.                           - 3 cm hiatal hernia.                           - Erythematous mucosa in the stomach. Biopsied.                           - No gross lesions in the duodenal bulb, in the                            first portion of the duodenum and in the second                            portion of the duodenum. Recommendation:           - The patient will be observed post-procedure,                            until all discharge criteria are met.                           - Discharge patient to home.                           - Patient has a contact number available for                            emergencies. The signs and symptoms of potential                            delayed complications were discussed with the                            patient. Return to normal activities tomorrow.                            Written discharge instructions were provided to the                            patient.                           - Dilation diet as per protocol.                           - Start Omeprazole 40 mg daily. May discontinue                            Famotidine, but if he feels this is helpful he may                            continue.                           -  Observe patient's clinical course.                           - Await pathology results.                           - Repeat upper endoscopy PRN for retreatment.                           - The findings and recommendations were discussed                             with the patient.                           - The findings and recommendations were discussed                            with the patient's family. Justice Britain, MD 10/21/2020 10:20:35 AM

## 2020-10-21 NOTE — Progress Notes (Signed)
CW - VS  Pt's states no medical or surgical changes since previsit or office visit.   Pt wears bil hearing aids.  His daughter, [name], is at the bedside to help.  maw

## 2020-10-21 NOTE — Progress Notes (Signed)
GASTROENTEROLOGY PROCEDURE H&P NOTE   Primary Care Physician: Dettinger, James Kaufmann, MD  HPI: James Thomas is a 86 y.o. male who presents for EGD for evaluation of dysphagia and Schatzki ring on Barium swallow.  Past Medical History:  Diagnosis Date   Arthritis    B12 deficiency    On injections   GERD (gastroesophageal reflux disease)    Hypercholesteremia    Hypertension    Hypothyroidism    Osteoporosis    Past Surgical History:  Procedure Laterality Date   BACK SURGERY  1979;1985   slipped disc;ruptured disc   CATARACT EXTRACTION W/PHACO  02/01/2011   Procedure: CATARACT EXTRACTION PHACO AND INTRAOCULAR LENS PLACEMENT (IOC);  Surgeon: James Branch, MD;  Location: [facility];  Service: Ophthalmology;  Laterality: Right;  CDE:18.53   Current Outpatient Medications  Medication Sig Dispense Refill   amLODipine (NORVASC) 5 MG tablet Take 1.5 tablets (7.5 mg total) by mouth daily. 135 tablet 3   Calcium Citrate-Vitamin D (CITRACAL + D PO) Take by mouth daily.     cholecalciferol (VITAMIN D) 1000 UNITS tablet Take 2,000 Units by mouth daily.      cyanocobalamin (,VITAMIN B-12,) 1000 MCG/ML injection INJECT 1 ML INTRAMUSCULARLY EVERY 14 DAYS. 2 mL 0   famotidine (PEPCID) 20 MG tablet Take 1 tablet (20 mg total) by mouth 2 (two) times daily. 60 tablet 1   levothyroxine (EUTHYROX) 75 MCG tablet Take 1 tablet (75 mcg total) by mouth daily. 90 tablet 3   losartan (COZAAR) 100 MG tablet Take 1 tablet (100 mg total) by mouth daily. 90 tablet 3   Multiple Vitamins-Minerals (CENTRUM SILVER 50+MEN PO) Take 1 tablet by mouth daily.     Omega-3 Fatty Acids (FISH OIL) 1200 MG CAPS Take 2 capsules by mouth daily.     polyethylene glycol (MIRALAX / GLYCOLAX) packet Take 17 g by mouth at bedtime.     pravastatin (PRAVACHOL) 20 MG tablet Take 1 tablet (20 mg total) by mouth daily. 90 tablet 3   No current facility-administered medications for this visit.    Current Outpatient Medications:     amLODipine (NORVASC) 5 MG tablet, Take 1.5 tablets (7.5 mg total) by mouth daily., Disp: 135 tablet, Rfl: 3   Calcium Citrate-Vitamin D (CITRACAL + D PO), Take by mouth daily., Disp: , Rfl:    cholecalciferol (VITAMIN D) 1000 UNITS tablet, Take 2,000 Units by mouth daily. , Disp: , Rfl:    cyanocobalamin (,VITAMIN B-12,) 1000 MCG/ML injection, INJECT 1 ML INTRAMUSCULARLY EVERY 14 DAYS., Disp: 2 mL, Rfl: 0   famotidine (PEPCID) 20 MG tablet, Take 1 tablet (20 mg total) by mouth 2 (two) times daily., Disp: 60 tablet, Rfl: 1   levothyroxine (EUTHYROX) 75 MCG tablet, Take 1 tablet (75 mcg total) by mouth daily., Disp: 90 tablet, Rfl: 3   losartan (COZAAR) 100 MG tablet, Take 1 tablet (100 mg total) by mouth daily., Disp: 90 tablet, Rfl: 3   Multiple Vitamins-Minerals (CENTRUM SILVER 50+MEN PO), Take 1 tablet by mouth daily., Disp: , Rfl:    Omega-3 Fatty Acids (FISH OIL) 1200 MG CAPS, Take 2 capsules by mouth daily., Disp: , Rfl:    polyethylene glycol (MIRALAX / GLYCOLAX) packet, Take 17 g by mouth at bedtime., Disp: , Rfl:    pravastatin (PRAVACHOL) 20 MG tablet, Take 1 tablet (20 mg total) by mouth daily., Disp: 90 tablet, Rfl: 3 Allergies  Allergen Reactions   Fenofibrate Cough   Meloxicam     Unsure but  thinks it gave him a rash   Niacin And Related Itching and Rash   Family History  Problem Relation Age of Onset   Diabetes Father    Heart attack Brother 34   Heart attack Maternal Grandmother    Anesthesia problems Neg Hx    Hypotension Neg Hx    Malignant hyperthermia Neg Hx    Pseudochol deficiency Neg Hx    Colon cancer Neg Hx    Esophageal cancer Neg Hx    Pancreatic cancer Neg Hx    Social History   Socioeconomic History   Marital status: Married    Spouse name: James Thomas   Number of children: 1   Years of education: 14 years   Highest education level: Futures trader degree: occupational, Hotel manager, or vocational program  Occupational History   Occupation: Retired    Comment:  Engineer, building services, bodywork  Tobacco Use   Smoking status: Former    Packs/day: 1.00    Years: 10.00    Pack years: 10.00    Types: Cigarettes    Quit date: 01/27/1972    Years since quitting: 48.7   Smokeless tobacco: Never  Vaping Use   Vaping Use: Never used  Substance and Sexual Activity   Alcohol use: No   Drug use: No   Sexual activity: Not Currently  Other Topics Concern   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 He is married and lives in a one story home with a basement with his wife. They have one adult daughter and two granddaughters.    Social Determinants of Health   Financial Resource Strain: Low Risk    Difficulty of Paying Living Expenses: Not hard at all  Food Insecurity: No Food Insecurity   Worried About Charity fundraiser in the Last Year: Never true   [place] in the Last Year: Never true  Transportation Needs: No Transportation Needs   Lack of Transportation (Medical): No   Lack of Transportation (Non-Medical): No  Physical Activity: Inactive   Days of Exercise per Week: 0 days   Minutes of Exercise per Session: 0 min  Stress: No Stress Concern Present   Feeling of Stress : Not at all  Social Connections: Socially Integrated   Frequency of Communication with Friends and Family: More than three times a week   Frequency of Social Gatherings with Friends and Family: More than three times a week   Attends Religious Services: More than 4 times per year   Active Member of [employer] or Organizations: Yes   Attends Archivist Meetings: Not on file   Marital Status: Married  Human resources officer Violence: Not At Risk   Fear of Current or Ex-Partner: No   Emotionally Abused: No   Physically Abused: No   Sexually Abused: No    Physical Exam: There were no vitals filed for this visit. There is no height or weight on file to calculate BMI. GEN: NAD EYE: Sclerae  anicteric ENT: MMM CV: Non-tachycardic GI: Soft, NT/ND NEURO:  Alert & Oriented x 3  Lab Results: No results for input(s): WBC, HGB, HCT, PLT in the last 72 hours. BMET No results for input(s): NA, K, CL, CO2, GLUCOSE, BUN, CREATININE, CALCIUM in the last 72 hours. LFT No results for input(s): PROT, ALBUMIN, AST, ALT, ALKPHOS, BILITOT, BILIDIR, IBILI in the last 72 hours. PT/INR No results for input(s): LABPROT, INR in  the last 72 hours.   Impression / Plan: This is a 86 y.o.male who presents for EGD for evaluation of dysphagia and Schatzki ring on Barium swallow.  The risks and benefits of endoscopic evaluation/treatment were discussed with the patient and/or family; these include but are not limited to the risk of perforation, infection, bleeding, missed lesions, lack of diagnosis, severe illness requiring hospitalization, as well as anesthesia and sedation related illnesses.  The patient's history has been reviewed, patient examined, no change in status, and deemed stable for procedure.  The patient and/or family is agreeable to proceed.    Justice Britain, MD [facility] Office # [phone]

## 2020-10-21 NOTE — Progress Notes (Signed)
ET CO2 not registering, gd SR's 16-18

## 2020-10-21 NOTE — Patient Instructions (Signed)
Follow the post-dilation diet. Start omeprazole 40 mg daily.  You may discontinue Famotidine, but if it helps, you may continue. Await pathology results. Repeat EGD as needed for treatment.   YOU HAD AN ENDOSCOPIC PROCEDURE TODAY AT [facility]:   Refer to the procedure report that was given to you for any specific questions about what was found during the examination.  If the procedure report does not answer your questions, please call your gastroenterologist to clarify.  If you requested that your care partner not be given the details of your procedure findings, then the procedure report has been included in a sealed envelope for you to review at your convenience later.  YOU SHOULD EXPECT: Some feelings of bloating in the abdomen. Passage of more gas than usual.  Walking can help get rid of the air that was put into your GI tract during the procedure and reduce the bloating. If you had a lower endoscopy (such as a colonoscopy or flexible sigmoidoscopy) you may notice spotting of blood in your stool or on the toilet paper. If you underwent a bowel prep for your procedure, you may not have a normal bowel movement for a few days.  Please Note:  You might notice some irritation and congestion in your nose or some drainage.  This is from the oxygen used during your procedure.  There is no need for concern and it should clear up in a day or so.  SYMPTOMS TO REPORT IMMEDIATELY:  Following upper endoscopy (EGD)  Vomiting of blood or coffee ground material  New chest pain or pain under the shoulder blades  Painful or persistently difficult swallowing  New shortness of breath  Fever of 100F or higher  Black, tarry-looking stools  For urgent or emergent issues, a gastroenterologist can be reached at any hour by calling [phone]. Do not use MyChart messaging for urgent concerns.    DIET:  We do recommend a small meal at first, but then you may proceed to your regular diet.   Drink plenty of fluids but you should avoid alcoholic beverages for 24 hours.  ACTIVITY:  You should plan to take it easy for the rest of today and you should NOT DRIVE or use heavy machinery until tomorrow (because of the sedation medicines used during the test).    FOLLOW UP: Our staff will call the number listed on your records 48-72 hours following your procedure to check on you and address any questions or concerns that you may have regarding the information given to you following your procedure. If we do not reach you, we will leave a message.  We will attempt to reach you two times.  During this call, we will ask if you have developed any symptoms of COVID 19. If you develop any symptoms (ie: fever, flu-like symptoms, shortness of breath, cough etc.) before then, please call [phone].  If you test positive for Covid 19 in the 2 weeks post procedure, please call and report this information to [place].    If any biopsies were taken you will be contacted by phone or by letter within the next 1-3 weeks.  Please call us at [phone] if you have not heard about the biopsies in 3 weeks.    SIGNATURES/CONFIDENTIALITY: You and/or your care partner have signed paperwork which will be entered into your electronic medical record.  These signatures attest to the fact that that the information above on your After Visit Summary has been reviewed and is  understood.  Full responsibility of the confidentiality of this discharge information lies with you and/or your care-partner.

## 2020-10-23 ENCOUNTER — Ambulatory Visit (INDEPENDENT_AMBULATORY_CARE_PROVIDER_SITE_OTHER): Payer: Medicare Other | Admitting: Family Medicine

## 2020-10-23 ENCOUNTER — Encounter: Payer: Self-pay | Admitting: Family Medicine

## 2020-10-23 ENCOUNTER — Telehealth: Payer: Self-pay

## 2020-10-23 ENCOUNTER — Other Ambulatory Visit: Payer: Self-pay

## 2020-10-23 VITALS — BP 132/66 | HR 78 | Ht 73.0 in | Wt 175.0 lb

## 2020-10-23 DIAGNOSIS — E782 Mixed hyperlipidemia: Secondary | ICD-10-CM | POA: Diagnosis not present

## 2020-10-23 DIAGNOSIS — I10 Essential (primary) hypertension: Secondary | ICD-10-CM

## 2020-10-23 DIAGNOSIS — E538 Deficiency of other specified B group vitamins: Secondary | ICD-10-CM

## 2020-10-23 DIAGNOSIS — Z23 Encounter for immunization: Secondary | ICD-10-CM | POA: Diagnosis not present

## 2020-10-23 DIAGNOSIS — R7303 Prediabetes: Secondary | ICD-10-CM | POA: Diagnosis not present

## 2020-10-23 DIAGNOSIS — E039 Hypothyroidism, unspecified: Secondary | ICD-10-CM | POA: Diagnosis not present

## 2020-10-23 LAB — BAYER DCA HB A1C WAIVED: HB A1C (BAYER DCA - WAIVED): 5.6 % (ref 4.8–5.6)

## 2020-10-23 NOTE — Progress Notes (Signed)
BP 132/66   Pulse 78   Ht _0  (1.854 m)   Wt 175 lb (79.4 kg)   SpO2 95%   BMI 23.09 kg/m    Subjective:   Patient ID: James Thomas, male    DOB: Oct 19, 1934, 86 y.o.   MRN: [id number]  HPI: James Thomas is a 86 y.o. male presenting on 10/23/2020 for Medical Management of Chronic Issues and Hypertension   HPI Hypertension Patient is currently on amlodipine and losartan, and their blood pressure today is 132/66. Patient denies any lightheadedness or dizziness. Patient denies headaches, blurred vision, chest pains, shortness of breath, or weakness. Denies any side effects from medication and is content with current medication.   Hyperlipidemia Patient is coming in for recheck of his hyperlipidemia. The patient is currently taking fish oils and pravastatin. They deny any issues with myalgias or history of liver damage from it. They deny any focal numbness or weakness or chest pain.   Hypothyroidism recheck Patient is coming in for thyroid recheck today as well. They deny any issues with hair changes or heat or cold problems or diarrhea or constipation. They deny any chest pain or palpitations. They are currently on levothyroxine 75 micrograms   Prediabetes  patient comes in today for recheck of his diabetes. Patient has been currently taking no medication currently has been diet controlled. Patient is currently on an ACE inhibitor/ARB. Patient has not seen an ophthalmologist this year. Patient denies any issues with their feet. The symptom started onset as an adult hypothyroidism and hyperlipidemia and hypertension ARE RELATED TO DM   Patient is coming today for a recheck of vitamin B12 deficiency, will check blood work today.  Relevant past medical, surgical, family and social history reviewed and updated as indicated. Interim medical history since our last visit reviewed. Allergies and medications reviewed and updated.  Review of Systems  Constitutional:  Negative for  chills and fever.  Respiratory:  Negative for shortness of breath and wheezing.   Cardiovascular:  Negative for chest pain and leg swelling.  Musculoskeletal:  Negative for back pain and gait problem.  Skin:  Negative for rash.  Neurological:  Negative for dizziness, weakness and light-headedness.  All other systems reviewed and are negative.  Per HPI unless specifically indicated above   Allergies as of 10/23/2020       Reactions   Fenofibrate Cough   Meloxicam    Unsure but thinks it gave him a rash   Niacin And Related Itching, Rash        Medication List        Accurate as of October 23, 2020 12:05 PM. If you have any questions, ask your nurse or doctor.          amLODipine 5 MG tablet Commonly known as: NORVASC Take 1.5 tablets (7.5 mg total) by mouth daily.   CENTRUM SILVER 50+MEN PO Take 1 tablet by mouth daily.   cholecalciferol 1000 units tablet Commonly known as: VITAMIN D Take 2,000 Units by mouth daily.   CITRACAL + D PO Take by mouth daily.   cyanocobalamin 1000 MCG/ML injection Commonly known as: (VITAMIN B-12) INJECT 1 ML INTRAMUSCULARLY EVERY 14 DAYS.   famotidine 20 MG tablet Commonly known as: PEPCID Take 1 tablet (20 mg total) by mouth 2 (two) times daily.   Fish Oil 1200 MG Caps Take 2 capsules by mouth daily.   levothyroxine 75 MCG tablet Commonly known as: Euthyrox Take 1 tablet (75 mcg total) by  mouth daily.   losartan 100 MG tablet Commonly known as: COZAAR Take 1 tablet (100 mg total) by mouth daily.   omeprazole 40 MG capsule Commonly known as: PRILOSEC Take 1 capsule (40 mg total) by mouth daily.   polyethylene glycol 17 g packet Commonly known as: MIRALAX / GLYCOLAX Take 17 g by mouth at bedtime.   pravastatin 20 MG tablet Commonly known as: PRAVACHOL Take 1 tablet (20 mg total) by mouth daily.         Objective:   BP 132/66   Pulse 78   Ht _0  (1.854 m)   Wt 175 lb (79.4 kg)   SpO2 95%   BMI 23.09  kg/m   Wt Readings from Last 3 Encounters:  10/23/20 175 lb (79.4 kg)  10/21/20 185 lb (83.9 kg)  09/25/20 185 lb (83.9 kg)    Physical Exam Vitals and nursing note reviewed.  Constitutional:      General: He is not in acute distress.    Appearance: He is well-developed. He is not diaphoretic.  Eyes:     General: No scleral icterus.    Conjunctiva/sclera: Conjunctivae normal.  Neck:     Thyroid: No thyromegaly.  Cardiovascular:     Rate and Rhythm: Normal rate and regular rhythm.     Heart sounds: Normal heart sounds. No murmur heard. Pulmonary:     Effort: Pulmonary effort is normal. No respiratory distress.     Breath sounds: Normal breath sounds. No wheezing.  Musculoskeletal:        General: Normal range of motion.     Cervical back: Neck supple.  Lymphadenopathy:     Cervical: No cervical adenopathy.  Skin:    General: Skin is warm and dry.     Findings: No rash.  Neurological:     Mental Status: He is alert and oriented to person, place, and time.     Coordination: Coordination normal.  Psychiatric:        Behavior: Behavior normal.        Cognition and Memory: Memory is impaired.      Assessment & Plan:   Problem List Items Addressed This Visit       Cardiovascular and Mediastinum   HTN (hypertension) - Primary   Relevant Orders   CBC with Differential/Platelet   CMP14+EGFR     Endocrine   Hypothyroid   Relevant Orders   TSH     Other   B12 deficiency   Relevant Orders   Vitamin B12   HLD (hyperlipidemia)   Relevant Orders   Lipid panel   Prediabetes   Relevant Orders   Bayer DCA Hb A1c Waived    Will check blood work, continue current medicine, seems to be doing well.  No changes. Follow up plan: Return in about 6 months (around 04/23/2021), or if symptoms worsen or fail to improve, for Hypertension and thyroid and cholesterol.  Counseling provided for all of the vaccine components Orders Placed This Encounter  Procedures   CBC with  Differential/Platelet   CMP14+EGFR   Lipid panel   Vitamin B12   TSH   Bayer DCA Hb A1c [place], MD [facility] 10/23/2020, 12:05 PM

## 2020-10-23 NOTE — Telephone Encounter (Signed)
  Follow up Call-  Call back number 10/21/2020  Post procedure Call Back phone  # [phone] cell  Permission to leave phone message No  comments no answering machine  Some recent data might be hidden     Patient questions:  Do you have a fever, pain , or abdominal swelling? No. Pain Score  0 *  Have you tolerated food without any problems? Yes.    Have you been able to return to your normal activities? Yes.    Do you have any questions about your discharge instructions: Diet   No. Medications  No. Follow up visit  No.  Do you have questions or concerns about your Care? No.  Actions: * If pain score is 4 or above: No action needed, pain <4. Have you developed a fever since your procedure? no  2.   Have you had an respiratory symptoms (SOB or cough) since your procedure? no  3.   Have you tested positive for COVID 19 since your procedure no  4.   Have you had any family members/close contacts diagnosed with the COVID 19 since your procedure?  no   If yes to any of these questions please route to Joylene John, RN and Joella Prince, RN

## 2020-10-24 LAB — CBC WITH DIFFERENTIAL/PLATELET
Basophils Absolute: 0.1 10*3/uL (ref 0.0–0.2)
Basos: 1 %
EOS (ABSOLUTE): 0.3 10*3/uL (ref 0.0–0.4)
Eos: 3 %
Hematocrit: 40.6 % (ref 37.5–51.0)
Hemoglobin: 13.5 g/dL (ref 13.0–17.7)
Immature Grans (Abs): 0 10*3/uL (ref 0.0–0.1)
Immature Granulocytes: 0 %
Lymphocytes Absolute: 1.4 10*3/uL (ref 0.7–3.1)
Lymphs: 15 %
MCH: 29.9 pg (ref 26.6–33.0)
MCHC: 33.3 g/dL (ref 31.5–35.7)
MCV: 90 fL (ref 79–97)
Monocytes Absolute: 0.6 10*3/uL (ref 0.1–0.9)
Monocytes: 7 %
Neutrophils Absolute: 7.1 10*3/uL — ABNORMAL HIGH (ref 1.4–7.0)
Neutrophils: 74 %
Platelets: 402 10*3/uL (ref 150–450)
RBC: 4.52 x10E6/uL (ref 4.14–5.80)
RDW: 13 % (ref 11.6–15.4)
WBC: 9.5 10*3/uL (ref 3.4–10.8)

## 2020-10-24 LAB — LIPID PANEL
Chol/HDL Ratio: 4.8 ratio (ref 0.0–5.0)
Cholesterol, Total: 157 mg/dL (ref 100–199)
HDL: 33 mg/dL — ABNORMAL LOW (ref >39)
LDL Chol Calc (NIH): 96 mg/dL (ref 0–99)
Triglycerides: 161 mg/dL — ABNORMAL HIGH (ref 0–149)
VLDL Cholesterol Cal: 28 mg/dL (ref 5–40)

## 2020-10-24 LAB — TSH: TSH: 2.96 u[IU]/mL (ref 0.450–4.500)

## 2020-10-24 LAB — CMP14+EGFR
ALT: 17 IU/L (ref 0–44)
AST: 18 IU/L (ref 0–40)
Albumin/Globulin Ratio: 1.7 (ref 1.2–2.2)
Albumin: 4.2 g/dL (ref 3.6–4.6)
Alkaline Phosphatase: 135 IU/L — ABNORMAL HIGH (ref 44–121)
BUN/Creatinine Ratio: 8 — ABNORMAL LOW (ref 10–24)
BUN: 9 mg/dL (ref 8–27)
Bilirubin Total: 0.7 mg/dL (ref 0.0–1.2)
CO2: 23 mmol/L (ref 20–29)
Calcium: 9.4 mg/dL (ref 8.6–10.2)
Chloride: 103 mmol/L (ref 96–106)
Creatinine, Ser: 1.07 mg/dL (ref 0.76–1.27)
Globulin, Total: 2.5 g/dL (ref 1.5–4.5)
Glucose: 105 mg/dL — ABNORMAL HIGH (ref 70–99)
Potassium: 4.1 mmol/L (ref 3.5–5.2)
Sodium: 143 mmol/L (ref 134–144)
Total Protein: 6.7 g/dL (ref 6.0–8.5)
eGFR: 68 mL/min/{1.73_m2} (ref >59)

## 2020-10-24 LAB — VITAMIN B12: Vitamin B-12: 1527 pg/mL — ABNORMAL HIGH (ref 232–1245)

## 2020-10-27 ENCOUNTER — Encounter: Payer: Self-pay | Admitting: Gastroenterology

## 2020-11-10 ENCOUNTER — Ambulatory Visit (INDEPENDENT_AMBULATORY_CARE_PROVIDER_SITE_OTHER): Payer: Medicare Other | Admitting: Gastroenterology

## 2020-11-12 DIAGNOSIS — Z20828 Contact with and (suspected) exposure to other viral communicable diseases: Secondary | ICD-10-CM | POA: Diagnosis not present

## 2020-11-15 ENCOUNTER — Other Ambulatory Visit: Payer: Self-pay | Admitting: Family Medicine

## 2020-11-17 MED ORDER — CYANOCOBALAMIN 1000 MCG/ML IJ SOLN: INTRAMUSCULAR | 0 refills | Status: DC

## 2020-11-18 DIAGNOSIS — Z85828 Personal history of other malignant neoplasm of skin: Secondary | ICD-10-CM | POA: Diagnosis not present

## 2020-11-18 DIAGNOSIS — L570 Actinic keratosis: Secondary | ICD-10-CM | POA: Diagnosis not present

## 2020-11-18 DIAGNOSIS — L853 Xerosis cutis: Secondary | ICD-10-CM | POA: Diagnosis not present

## 2020-12-02 DIAGNOSIS — B351 Tinea unguium: Secondary | ICD-10-CM | POA: Diagnosis not present

## 2020-12-02 DIAGNOSIS — M79676 Pain in unspecified toe(s): Secondary | ICD-10-CM | POA: Diagnosis not present

## 2020-12-18 ENCOUNTER — Other Ambulatory Visit: Payer: Self-pay | Admitting: Nurse Practitioner

## 2020-12-24 ENCOUNTER — Other Ambulatory Visit: Payer: Self-pay | Admitting: Family Medicine

## 2020-12-24 DIAGNOSIS — M170 Bilateral primary osteoarthritis of knee: Secondary | ICD-10-CM | POA: Diagnosis not present

## 2021-01-01 DIAGNOSIS — Z20822 Contact with and (suspected) exposure to covid-19: Secondary | ICD-10-CM | POA: Diagnosis not present

## 2021-01-27 DIAGNOSIS — Z20822 Contact with and (suspected) exposure to covid-19: Secondary | ICD-10-CM | POA: Diagnosis not present

## 2021-02-09 DIAGNOSIS — Z20822 Contact with and (suspected) exposure to covid-19: Secondary | ICD-10-CM | POA: Diagnosis not present

## 2021-02-26 DIAGNOSIS — Z20822 Contact with and (suspected) exposure to covid-19: Secondary | ICD-10-CM | POA: Diagnosis not present

## 2021-03-03 DIAGNOSIS — B351 Tinea unguium: Secondary | ICD-10-CM | POA: Diagnosis not present

## 2021-03-03 DIAGNOSIS — M79676 Pain in unspecified toe(s): Secondary | ICD-10-CM | POA: Diagnosis not present

## 2021-03-24 DIAGNOSIS — Z20822 Contact with and (suspected) exposure to covid-19: Secondary | ICD-10-CM | POA: Diagnosis not present

## 2021-04-07 DIAGNOSIS — Z20822 Contact with and (suspected) exposure to covid-19: Secondary | ICD-10-CM | POA: Diagnosis not present

## 2021-04-20 DIAGNOSIS — Z20822 Contact with and (suspected) exposure to covid-19: Secondary | ICD-10-CM | POA: Diagnosis not present

## 2021-04-24 ENCOUNTER — Ambulatory Visit: Payer: Medicare Other | Admitting: Family Medicine

## 2021-04-24 DIAGNOSIS — Z20822 Contact with and (suspected) exposure to covid-19: Secondary | ICD-10-CM | POA: Diagnosis not present

## 2021-04-25 DIAGNOSIS — Z20828 Contact with and (suspected) exposure to other viral communicable diseases: Secondary | ICD-10-CM | POA: Diagnosis not present

## 2021-04-28 DIAGNOSIS — Z20822 Contact with and (suspected) exposure to covid-19: Secondary | ICD-10-CM | POA: Diagnosis not present

## 2021-04-30 DIAGNOSIS — M170 Bilateral primary osteoarthritis of knee: Secondary | ICD-10-CM | POA: Diagnosis not present

## 2021-05-02 ENCOUNTER — Other Ambulatory Visit: Payer: Self-pay | Admitting: Family Medicine

## 2021-05-02 DIAGNOSIS — I10 Essential (primary) hypertension: Secondary | ICD-10-CM

## 2021-05-05 DIAGNOSIS — Z20822 Contact with and (suspected) exposure to covid-19: Secondary | ICD-10-CM | POA: Diagnosis not present

## 2021-05-11 DIAGNOSIS — Z20822 Contact with and (suspected) exposure to covid-19: Secondary | ICD-10-CM | POA: Diagnosis not present

## 2021-05-12 DIAGNOSIS — H524 Presbyopia: Secondary | ICD-10-CM | POA: Diagnosis not present

## 2021-05-12 DIAGNOSIS — H2512 Age-related nuclear cataract, left eye: Secondary | ICD-10-CM | POA: Diagnosis not present

## 2021-05-12 DIAGNOSIS — H5203 Hypermetropia, bilateral: Secondary | ICD-10-CM | POA: Diagnosis not present

## 2021-05-12 DIAGNOSIS — Z961 Presence of intraocular lens: Secondary | ICD-10-CM | POA: Diagnosis not present

## 2021-05-12 DIAGNOSIS — H52222 Regular astigmatism, left eye: Secondary | ICD-10-CM | POA: Diagnosis not present

## 2021-05-13 DIAGNOSIS — Z20822 Contact with and (suspected) exposure to covid-19: Secondary | ICD-10-CM | POA: Diagnosis not present

## 2021-05-14 DIAGNOSIS — Z20822 Contact with and (suspected) exposure to covid-19: Secondary | ICD-10-CM | POA: Diagnosis not present

## 2021-06-04 ENCOUNTER — Encounter: Payer: Self-pay | Admitting: Family Medicine

## 2021-06-04 ENCOUNTER — Ambulatory Visit (INDEPENDENT_AMBULATORY_CARE_PROVIDER_SITE_OTHER): Payer: Medicare Other | Admitting: Family Medicine

## 2021-06-04 VITALS — BP 124/71 | HR 80 | Temp 97.9°F | Ht 73.0 in | Wt 172.0 lb

## 2021-06-04 DIAGNOSIS — E782 Mixed hyperlipidemia: Secondary | ICD-10-CM

## 2021-06-04 DIAGNOSIS — Z23 Encounter for immunization: Secondary | ICD-10-CM

## 2021-06-04 DIAGNOSIS — I10 Essential (primary) hypertension: Secondary | ICD-10-CM

## 2021-06-04 DIAGNOSIS — R7303 Prediabetes: Secondary | ICD-10-CM | POA: Diagnosis not present

## 2021-06-04 DIAGNOSIS — E538 Deficiency of other specified B group vitamins: Secondary | ICD-10-CM | POA: Diagnosis not present

## 2021-06-04 DIAGNOSIS — E039 Hypothyroidism, unspecified: Secondary | ICD-10-CM | POA: Diagnosis not present

## 2021-06-04 LAB — BAYER DCA HB A1C WAIVED: HB A1C (BAYER DCA - WAIVED): 5.7 % — ABNORMAL HIGH (ref 4.8–5.6)

## 2021-06-04 MED ORDER — AMLODIPINE BESYLATE 5 MG PO TABS: ORAL_TABLET | ORAL | 3 refills | Status: DC

## 2021-06-04 MED ORDER — LEVOTHYROXINE SODIUM 75 MCG PO TABS: 75.0000 ug | ORAL_TABLET | Freq: Every day | ORAL | 3 refills | Status: DC

## 2021-06-04 MED ORDER — LOSARTAN POTASSIUM 100 MG PO TABS: 100.0000 mg | ORAL_TABLET | Freq: Every day | ORAL | 3 refills | Status: DC

## 2021-06-04 MED ORDER — CYANOCOBALAMIN 1000 MCG/ML IJ SOLN: INTRAMUSCULAR | 5 refills | Status: DC

## 2021-06-04 MED ORDER — PRAVASTATIN SODIUM 20 MG PO TABS: 20.0000 mg | ORAL_TABLET | Freq: Every day | ORAL | 3 refills | Status: DC

## 2021-06-04 NOTE — Progress Notes (Signed)
BP 124/71   Pulse 80   Temp 97.9 F (36.6 C)   Ht _0  (1.854 m)   Wt 172 lb (78 kg)   SpO2 96%   BMI 22.69 kg/m    Subjective:   Patient ID: James Thomas, male    DOB: Feb 13, 1934, 87 y.o.   MRN: [id number]  HPI: James Thomas is a 87 y.o. male presenting on 06/04/2021 for Medical Management of Chronic Issues, Hypertension, and Prediabetes   HPI Prediabetes  patient comes in today for recheck of his diabetes. Patient has been currently taking no medication currently, diet control. Patient is currently on an ACE inhibitor/ARB. Patient has not seen an ophthalmologist this year. Patient denies any issues with their feet. The symptom started onset as an adult hypertension and hyperlipidemia ARE RELATED TO DM   Hypertension Patient is currently on losartan and amlodipine, and their blood pressure today is 124/71. Patient denies any lightheadedness or dizziness. Patient denies headaches, blurred vision, chest pains, shortness of breath, or weakness. Denies any side effects from medication and is content with current medication.   Hyperlipidemia Patient is coming in for recheck of his hyperlipidemia. The patient is currently taking fish oils and pravastatin. They deny any issues with myalgias or history of liver damage from it. They deny any focal numbness or weakness or chest pain. Marland Kitchen  Hypothyroidism recheck Patient is coming in for thyroid recheck today as well. They deny any issues with hair changes or heat or cold problems or diarrhea or constipation. They deny any chest pain or palpitations. They are currently on levothyroxine 75 micrograms   Patient is coming in for recheck of B12 deficiency.  He is taking oral B12 now.  Relevant past medical, surgical, family and social history reviewed and updated as indicated. Interim medical history since our last visit reviewed. Allergies and medications reviewed and updated.  Review of Systems  Constitutional:  Negative for chills and  fever.  Eyes:  Negative for visual disturbance.  Respiratory:  Negative for shortness of breath and wheezing.   Cardiovascular:  Negative for chest pain and leg swelling.  Musculoskeletal:  Negative for back pain and gait problem.  Skin:  Negative for rash.  Neurological:  Negative for dizziness, weakness and light-headedness.  All other systems reviewed and are negative.  Per HPI unless specifically indicated above   Allergies as of 06/04/2021       Reactions   Fenofibrate Cough   Meloxicam    Unsure but thinks it gave him a rash   Niacin And Related Itching, Rash        Medication List        Accurate as of June 04, 2021 11:23 AM. If you have any questions, ask your nurse or doctor.          amLODipine 5 MG tablet Commonly known as: NORVASC TAKE 1 & 1/2 (ONE & ONE-HALF) TABLETS BY MOUTH ONCE DAILY   CENTRUM SILVER 50+MEN PO Take 1 tablet by mouth daily.   cholecalciferol 1000 units tablet Commonly known as: VITAMIN D Take 2,000 Units by mouth daily.   CITRACAL + D PO Take by mouth daily.   cyanocobalamin 1000 MCG/ML injection Commonly known as: (VITAMIN B-12) Inject 1ML every 14 days What changed: See the new instructions. Changed by: Fransisca Kaufmann Ioma Chismar, MD   famotidine 20 MG tablet Commonly known as: PEPCID Take 1 tablet (20 mg total) by mouth 2 (two) times daily.   Fish Oil 1200 MG  Caps Take 2 capsules by mouth daily.   levothyroxine 75 MCG tablet Commonly known as: Euthyrox Take 1 tablet (75 mcg total) by mouth daily.   losartan 100 MG tablet Commonly known as: COZAAR Take 1 tablet (100 mg total) by mouth daily.   omeprazole 40 MG capsule Commonly known as: PRILOSEC Take 1 capsule (40 mg total) by mouth daily.   polyethylene glycol 17 g packet Commonly known as: MIRALAX / GLYCOLAX Take 17 g by mouth at bedtime.   pravastatin 20 MG tablet Commonly known as: PRAVACHOL Take 1 tablet (20 mg total) by mouth daily.         Objective:    BP 124/71   Pulse 80   Temp 97.9 F (36.6 C)   Ht _0  (1.854 m)   Wt 172 lb (78 kg)   SpO2 96%   BMI 22.69 kg/m   Wt Readings from Last 3 Encounters:  06/04/21 172 lb (78 kg)  10/23/20 175 lb (79.4 kg)  10/21/20 185 lb (83.9 kg)    Physical Exam Vitals and nursing note reviewed.  Constitutional:      General: He is not in acute distress.    Appearance: He is well-developed. He is not diaphoretic.  Eyes:     General: No scleral icterus.       Right eye: No discharge.     Conjunctiva/sclera: Conjunctivae normal.     Pupils: Pupils are equal, round, and reactive to light.  Neck:     Thyroid: No thyromegaly.  Cardiovascular:     Rate and Rhythm: Normal rate and regular rhythm.     Heart sounds: Normal heart sounds. No murmur heard. Pulmonary:     Effort: Pulmonary effort is normal. No respiratory distress.     Breath sounds: Normal breath sounds. No wheezing.  Musculoskeletal:        General: Normal range of motion.     Cervical back: Neck supple.  Lymphadenopathy:     Cervical: No cervical adenopathy.  Skin:    General: Skin is warm and dry.     Findings: No rash.  Neurological:     Mental Status: He is alert and oriented to person, place, and time.     Coordination: Coordination normal.  Psychiatric:        Behavior: Behavior normal.      Assessment & Plan:   Problem List Items Addressed This Visit       Cardiovascular and Mediastinum   HTN (hypertension) - Primary   Relevant Medications   amLODipine (NORVASC) 5 MG tablet   losartan (COZAAR) 100 MG tablet   pravastatin (PRAVACHOL) 20 MG tablet   Other Relevant Orders   CBC with Differential/Platelet   CMP14+EGFR     Endocrine   Hypothyroid   Relevant Medications   levothyroxine (EUTHYROX) 75 MCG tablet   Other Relevant Orders   TSH     Other   HLD (hyperlipidemia)   Relevant Medications   amLODipine (NORVASC) 5 MG tablet   losartan (COZAAR) 100 MG tablet   pravastatin (PRAVACHOL) 20 MG  tablet   Other Relevant Orders   Lipid panel   Prediabetes   Relevant Orders   Bayer DCA Hb A1c Waived   B12 deficiency   Relevant Medications   cyanocobalamin (,VITAMIN B-12,) 1000 MCG/ML injection   Other Relevant Orders   Vitamin B12   Other Visit Diagnoses     Need for Tdap vaccination       Relevant Orders  Tdap vaccine greater than or equal to 7yo IM   Need for shingles vaccine       Relevant Orders   Varicella-zoster vaccine IM (Shingrix)       Memory is about the same, he seems to be doing well in other regards per daughter.  Continue current medicine, will check blood work.  He is taking oral B12 instead of injections now and we will check the levels and see how he is doing. Follow up plan: Return in about 6 months (around 12/04/2021), or if symptoms worsen or fail to improve, for Thyroid and hypertension and cholesterol recheck.  Counseling provided for all of the vaccine components Orders Placed This Encounter  Procedures   Tdap vaccine greater than or equal to 7yo IM   Varicella-zoster vaccine IM (Shingrix)   CBC with Differential/Platelet   CMP14+EGFR   Lipid panel   TSH   Bayer [place] Hb A1c Waived   Vitamin B12    Caryl Pina, MD [facility] 06/04/2021, 11:23 AM

## 2021-06-05 ENCOUNTER — Encounter: Payer: Self-pay | Admitting: Family Medicine

## 2021-06-05 LAB — CBC WITH DIFFERENTIAL/PLATELET
Basophils Absolute: 0.1 10*3/uL (ref 0.0–0.2)
Basos: 1 %
EOS (ABSOLUTE): 0.2 10*3/uL (ref 0.0–0.4)
Eos: 3 %
Hematocrit: 44.1 % (ref 37.5–51.0)
Hemoglobin: 15 g/dL (ref 13.0–17.7)
Immature Grans (Abs): 0 10*3/uL (ref 0.0–0.1)
Immature Granulocytes: 0 %
Lymphocytes Absolute: 1.2 10*3/uL (ref 0.7–3.1)
Lymphs: 15 %
MCH: 31.6 pg (ref 26.6–33.0)
MCHC: 34 g/dL (ref 31.5–35.7)
MCV: 93 fL (ref 79–97)
Monocytes Absolute: 0.6 10*3/uL (ref 0.1–0.9)
Monocytes: 7 %
Neutrophils Absolute: 6.1 10*3/uL (ref 1.4–7.0)
Neutrophils: 74 %
Platelets: 373 10*3/uL (ref 150–450)
RBC: 4.75 x10E6/uL (ref 4.14–5.80)
RDW: 12.4 % (ref 11.6–15.4)
WBC: 8.2 10*3/uL (ref 3.4–10.8)

## 2021-06-05 LAB — CMP14+EGFR
ALT: 16 IU/L (ref 0–44)
AST: 19 IU/L (ref 0–40)
Albumin/Globulin Ratio: 1.8 (ref 1.2–2.2)
Albumin: 4.3 g/dL (ref 3.6–4.6)
Alkaline Phosphatase: 136 IU/L — ABNORMAL HIGH (ref 44–121)
BUN/Creatinine Ratio: 8 — ABNORMAL LOW (ref 10–24)
BUN: 10 mg/dL (ref 8–27)
Bilirubin Total: 0.7 mg/dL (ref 0.0–1.2)
CO2: 25 mmol/L (ref 20–29)
Calcium: 9.8 mg/dL (ref 8.6–10.2)
Chloride: 102 mmol/L (ref 96–106)
Creatinine, Ser: 1.22 mg/dL (ref 0.76–1.27)
Globulin, Total: 2.4 g/dL (ref 1.5–4.5)
Glucose: 179 mg/dL — ABNORMAL HIGH (ref 70–99)
Potassium: 4.5 mmol/L (ref 3.5–5.2)
Sodium: 143 mmol/L (ref 134–144)
Total Protein: 6.7 g/dL (ref 6.0–8.5)
eGFR: 58 mL/min/{1.73_m2} — ABNORMAL LOW (ref >59)

## 2021-06-05 LAB — LIPID PANEL
Chol/HDL Ratio: 4.5 ratio (ref 0.0–5.0)
Cholesterol, Total: 159 mg/dL (ref 100–199)
HDL: 35 mg/dL — ABNORMAL LOW (ref >39)
LDL Chol Calc (NIH): 99 mg/dL (ref 0–99)
Triglycerides: 141 mg/dL (ref 0–149)
VLDL Cholesterol Cal: 25 mg/dL (ref 5–40)

## 2021-06-05 LAB — TSH: TSH: 2.66 u[IU]/mL (ref 0.450–4.500)

## 2021-06-05 LAB — VITAMIN B12: Vitamin B-12: 724 pg/mL (ref 232–1245)

## 2021-06-23 DIAGNOSIS — D0439 Carcinoma in situ of skin of other parts of face: Secondary | ICD-10-CM | POA: Diagnosis not present

## 2021-06-23 DIAGNOSIS — Z1283 Encounter for screening for malignant neoplasm of skin: Secondary | ICD-10-CM | POA: Diagnosis not present

## 2021-06-23 DIAGNOSIS — D485 Neoplasm of uncertain behavior of skin: Secondary | ICD-10-CM | POA: Diagnosis not present

## 2021-06-23 DIAGNOSIS — Z85828 Personal history of other malignant neoplasm of skin: Secondary | ICD-10-CM | POA: Diagnosis not present

## 2021-07-02 DIAGNOSIS — C44329 Squamous cell carcinoma of skin of other parts of face: Secondary | ICD-10-CM | POA: Diagnosis not present

## 2021-10-08 ENCOUNTER — Encounter: Payer: Self-pay | Admitting: Family Medicine

## 2021-10-08 ENCOUNTER — Ambulatory Visit (INDEPENDENT_AMBULATORY_CARE_PROVIDER_SITE_OTHER): Payer: Medicare Other

## 2021-10-08 ENCOUNTER — Ambulatory Visit (INDEPENDENT_AMBULATORY_CARE_PROVIDER_SITE_OTHER): Payer: Medicare Other | Admitting: Family Medicine

## 2021-10-08 VITALS — BP 125/61 | HR 73 | Temp 97.2°F | Ht 73.0 in | Wt 174.0 lb

## 2021-10-08 DIAGNOSIS — M25511 Pain in right shoulder: Secondary | ICD-10-CM

## 2021-10-08 DIAGNOSIS — M25551 Pain in right hip: Secondary | ICD-10-CM

## 2021-10-08 DIAGNOSIS — Z23 Encounter for immunization: Secondary | ICD-10-CM | POA: Diagnosis not present

## 2021-10-08 DIAGNOSIS — W19XXXA Unspecified fall, initial encounter: Secondary | ICD-10-CM

## 2021-10-08 NOTE — Progress Notes (Signed)
BP 125/61   Pulse 73   Temp (!) 97.2 F (36.2 C)   Ht '6\' 1"'$  (1.854 m)   Wt 174 lb (78.9 kg)   SpO2 100%   BMI 22.96 kg/m    Subjective:   Patient ID: James Thomas, male    DOB: [birth year], 87 y.o.   MRN: [id number]  HPI: James Thomas is a 87 y.o. male presenting on 10/08/2021 for Fall (/) and nodules (chest)   HPI Patient is brought in today by his daughter who says that he had a fall.  He had a fall initially in August and then again on 10/01/2021.  In August he did not have any effects from it but on his last 1 which was just over a week ago he has been complaining of right arm/right shoulder pain and right hip pain because he fell onto that side.  He did have a scratch on his leg for head and his forehead but both those are healing.  He has been using a walker since this happened for support because it does hurt to stand on that right leg on the lateral aspect of the hip is what he describes.  On the shoulder he describes pain anteriorly and is limited in range of motion.  Relevant past medical, surgical, family and social history reviewed and updated as indicated. Interim medical history since our last visit reviewed. Allergies and medications reviewed and updated.  Review of Systems  Constitutional:  Negative for chills and fever.  Eyes:  Negative for discharge.  Respiratory:  Negative for shortness of breath and wheezing.   Cardiovascular:  Negative for chest pain and leg swelling.  Musculoskeletal:  Positive for arthralgias, gait problem and myalgias. Negative for back pain.  Skin:  Negative for color change and rash.  All other systems reviewed and are negative.   Per HPI unless specifically indicated above   Allergies as of 10/08/2021       Reactions   Fenofibrate Cough   Meloxicam    Unsure but thinks it gave him a rash   Niacin And Related Itching, Rash        Medication List        Accurate as of October 08, 2021  1:52 PM. If you have any questions,  ask your nurse or doctor.          STOP taking these medications    famotidine 20 MG tablet Commonly known as: PEPCID Stopped by: Fransisca Kaufmann Eshika Reckart, MD       TAKE these medications    amLODipine 5 MG tablet Commonly known as: NORVASC TAKE 1 & 1/2 (ONE & ONE-HALF) TABLETS BY MOUTH ONCE DAILY   B-12 500 MCG Tabs Take 1 tablet by mouth at bedtime. What changed: Another medication with the same name was removed. Continue taking this medication, and follow the directions you see here. Changed by: Fransisca Kaufmann Yeilin Zweber, MD   CENTRUM SILVER 50+MEN PO Take 1 tablet by mouth daily.   cholecalciferol 1000 units tablet Commonly known as: VITAMIN D Take 2,000 Units by mouth daily.   CITRACAL + D PO Take by mouth daily.   Fish Oil 1200 MG Caps Take 2 capsules by mouth daily.   levothyroxine 75 MCG tablet Commonly known as: Euthyrox Take 1 tablet (75 mcg total) by mouth daily.   losartan 100 MG tablet Commonly known as: COZAAR Take 1 tablet (100 mg total) by mouth daily.   omeprazole 40 MG capsule Commonly known  as: PRILOSEC Take 1 capsule (40 mg total) by mouth daily.   polyethylene glycol 17 g packet Commonly known as: MIRALAX / GLYCOLAX Take 17 g by mouth at bedtime.   pravastatin 20 MG tablet Commonly known as: PRAVACHOL Take 1 tablet (20 mg total) by mouth daily.         Objective:   BP 125/61   Pulse 73   Temp (!) 97.2 F (36.2 C)   Ht '6\' 1"'$  (8.341 m)   Wt 174 lb (78.9 kg)   SpO2 100%   BMI 22.96 kg/m   Wt Readings from Last 3 Encounters:  10/08/21 174 lb (78.9 kg)  06/04/21 172 lb (78 kg)  10/23/20 175 lb (79.4 kg)    Physical Exam Vitals and nursing note reviewed.  Constitutional:      Appearance: Normal appearance.  Musculoskeletal:     Right shoulder: Crepitus present. No tenderness. Decreased range of motion (Pain with range of motion, especially overhead).     Right hip: Tenderness (Lateral right hip tenderness) present. No bony  tenderness or crepitus. Normal range of motion.     Left hip: No bony tenderness or crepitus. Normal range of motion.  Neurological:     General: No focal deficit present.     Mental Status: He is alert. Mental status is at baseline. He is disoriented.       Assessment & Plan:   Problem List Items Addressed This Visit   None Visit Diagnoses     Fall, initial encounter    -  Primary   Relevant Orders   DG HIP UNILAT W OR W/O PELVIS 2-3 VIEWS RIGHT   DG Shoulder Right   Need for immunization against influenza       Relevant Orders   Flu Vaccine QUAD High Dose(Fluad) (Completed)   Acute pain of right shoulder       Relevant Orders   DG HIP UNILAT W OR W/O PELVIS 2-3 VIEWS RIGHT   DG Shoulder Right   Right hip pain       Relevant Orders   DG HIP UNILAT W OR W/O PELVIS 2-3 VIEWS RIGHT   DG Shoulder Right       X-ray pending radiology read.  Patient seems to be walking okay with a walker on it and complains of mild pain with walking  Patient has a few spots on his checks that look precancerous, recommended for him to go see dermatology Follow up plan: Return if symptoms worsen or fail to improve.  Counseling provided for all of the vaccine components Orders Placed This Encounter  Procedures   DG HIP UNILAT W OR W/O PELVIS 2-3 VIEWS RIGHT   DG Shoulder Right   Flu Vaccine QUAD High Dose(Fluad)    Caryl Pina, MD [facility] 10/08/2021, 1:52 PM

## 2021-11-07 ENCOUNTER — Other Ambulatory Visit: Payer: Self-pay | Admitting: Gastroenterology

## 2021-11-09 DIAGNOSIS — M170 Bilateral primary osteoarthritis of knee: Secondary | ICD-10-CM | POA: Diagnosis not present

## 2021-12-01 ENCOUNTER — Other Ambulatory Visit: Payer: Self-pay | Admitting: Gastroenterology

## 2021-12-01 NOTE — Telephone Encounter (Signed)
I spoke to the patients daughter [name]. They are planning on transferring care to pcp for the omeprazole.

## 2021-12-04 ENCOUNTER — Ambulatory Visit (INDEPENDENT_AMBULATORY_CARE_PROVIDER_SITE_OTHER): Payer: Medicare Other | Admitting: Family Medicine

## 2021-12-04 ENCOUNTER — Encounter: Payer: Self-pay | Admitting: Family Medicine

## 2021-12-04 VITALS — BP 116/67 | HR 66 | Temp 97.4°F | Ht 73.0 in | Wt 173.0 lb

## 2021-12-04 DIAGNOSIS — E782 Mixed hyperlipidemia: Secondary | ICD-10-CM

## 2021-12-04 DIAGNOSIS — R7303 Prediabetes: Secondary | ICD-10-CM | POA: Diagnosis not present

## 2021-12-04 DIAGNOSIS — E039 Hypothyroidism, unspecified: Secondary | ICD-10-CM | POA: Diagnosis not present

## 2021-12-04 DIAGNOSIS — I10 Essential (primary) hypertension: Secondary | ICD-10-CM | POA: Diagnosis not present

## 2021-12-04 LAB — BAYER DCA HB A1C WAIVED: HB A1C (BAYER DCA - WAIVED): 5.9 % — ABNORMAL HIGH (ref 4.8–5.6)

## 2021-12-04 MED ORDER — OMEPRAZOLE 40 MG PO CPDR: 40.0000 mg | DELAYED_RELEASE_CAPSULE | Freq: Every day | ORAL | 3 refills | Status: DC

## 2021-12-04 NOTE — Progress Notes (Signed)
BP 116/67   Pulse 66   Temp (!) 97.4 F (36.3 C)   Ht _0  (1.854 m)   Wt 173 lb (78.5 kg)   SpO2 98%   BMI 22.82 kg/m    Subjective:   Patient ID: James Thomas, male    DOB: 1934-04-07, 87 y.o.   MRN: [id number]  HPI: James Thomas is a 87 y.o. male presenting on 12/04/2021 for Medical Management of Chronic Issues, Hypertension, and Dysphagia   HPI Prediabetes Patient comes in today for recheck of his diabetes. Patient has been currently taking no medication currently. Patient is currently on an ACE inhibitor/ARB. Patient has not seen an ophthalmologist this year. Patient denies any issues with their feet. The symptom started onset as an adult hypothyroidism hypertension and hyperlipidemia ARE RELATED TO DM   Hypothyroidism recheck Patient is coming in for thyroid recheck today as well. They deny any issues with hair changes or heat or cold problems or diarrhea or constipation. They deny any chest pain or palpitations. They are currently on levothyroxine 75 micrograms   Hypertension Patient is currently on losartan and amlodipine, and their blood pressure today is 116/67. Patient denies any lightheadedness or dizziness. Patient denies headaches, blurred vision, chest pains, shortness of breath, or weakness. Denies any side effects from medication and is content with current medication.   Hyperlipidemia Patient is coming in for recheck of his hyperlipidemia. The patient is currently taking fish oils and pravastatin. They deny any issues with myalgias or history of liver damage from it. They deny any focal numbness or weakness or chest pain.   Relevant past medical, surgical, family and social history reviewed and updated as indicated. Interim medical history since our last visit reviewed. Allergies and medications reviewed and updated.  Review of Systems  Constitutional:  Negative for chills and fever.  Eyes:  Negative for visual disturbance.  Respiratory:  Negative for  shortness of breath and wheezing.   Cardiovascular:  Negative for chest pain and leg swelling.  Musculoskeletal:  Negative for back pain and gait problem.  Skin:  Negative for rash.  Neurological:  Negative for dizziness, weakness and light-headedness.  All other systems reviewed and are negative.   Per HPI unless specifically indicated above   Allergies as of 12/04/2021       Reactions   Fenofibrate Cough   Meloxicam    Unsure but thinks it gave him a rash   Niacin And Related Itching, Rash        Medication List        Accurate as of December 04, 2021  8:20 AM. If you have any questions, ask your nurse or doctor.          amLODipine 5 MG tablet Commonly known as: NORVASC TAKE 1 & 1/2 (ONE & ONE-HALF) TABLETS BY MOUTH ONCE DAILY   B-12 500 MCG Tabs Take 1 tablet by mouth at bedtime.   CENTRUM SILVER 50+MEN PO Take 1 tablet by mouth daily.   cholecalciferol 1000 units tablet Commonly known as: VITAMIN D Take 2,000 Units by mouth daily.   CITRACAL + D PO Take by mouth daily.   Fish Oil 1200 MG Caps Take 2 capsules by mouth daily.   levothyroxine 75 MCG tablet Commonly known as: Euthyrox Take 1 tablet (75 mcg total) by mouth daily.   losartan 100 MG tablet Commonly known as: COZAAR Take 1 tablet (100 mg total) by mouth daily.   omeprazole 40 MG capsule Commonly known  as: PRILOSEC Take 1 capsule (40 mg total) by mouth daily.   polyethylene glycol 17 g packet Commonly known as: MIRALAX / GLYCOLAX Take 17 g by mouth at bedtime.   pravastatin 20 MG tablet Commonly known as: PRAVACHOL Take 1 tablet (20 mg total) by mouth daily.         Objective:   BP 116/67   Pulse 66   Temp (!) 97.4 F (36.3 C)   Ht _0  (1.854 m)   Wt 173 lb (78.5 kg)   SpO2 98%   BMI 22.82 kg/m   Wt Readings from Last 3 Encounters:  12/04/21 173 lb (78.5 kg)  10/08/21 174 lb (78.9 kg)  06/04/21 172 lb (78 kg)    Physical Exam Vitals and nursing note reviewed.   Constitutional:      General: He is not in acute distress.    Appearance: He is well-developed. He is not diaphoretic.  Eyes:     General: No scleral icterus.    Conjunctiva/sclera: Conjunctivae normal.  Neck:     Thyroid: No thyromegaly.  Cardiovascular:     Rate and Rhythm: Normal rate and regular rhythm.     Heart sounds: Normal heart sounds. No murmur heard. Pulmonary:     Effort: Pulmonary effort is normal. No respiratory distress.     Breath sounds: Normal breath sounds. No wheezing.  Musculoskeletal:        General: No swelling. Normal range of motion.     Cervical back: Neck supple.  Lymphadenopathy:     Cervical: No cervical adenopathy.  Skin:    General: Skin is warm and dry.     Findings: No rash.  Neurological:     Mental Status: He is alert and oriented to person, place, and time.     Coordination: Coordination normal.  Psychiatric:        Behavior: Behavior normal.       Assessment & Plan:   Problem List Items Addressed This Visit       Cardiovascular and Mediastinum   HTN (hypertension) - Primary   Relevant Orders   CBC with Differential/Platelet   CMP14+EGFR   Lipid panel   Bayer DCA Hb A1c Waived   TSH     Endocrine   Hypothyroid   Relevant Orders   CBC with Differential/Platelet   CMP14+EGFR   Lipid panel   Bayer DCA Hb A1c Waived   TSH     Other   HLD (hyperlipidemia)   Relevant Orders   CBC with Differential/Platelet   CMP14+EGFR   Lipid panel   Bayer DCA Hb A1c Waived   TSH   Prediabetes   Relevant Orders   CBC with Differential/Platelet   CMP14+EGFR   Lipid panel   Bayer DCA Hb A1c Waived   TSH    Seems to be doing well, will check blood work.  No changes Follow up plan: Return in about 6 months (around 06/05/2022), or if symptoms worsen or fail to improve, for Hypertension cholesterol and hypothyroidism.  Counseling provided for all of the vaccine components Orders Placed This Encounter  Procedures   CBC with  Differential/Platelet   CMP14+EGFR   Lipid panel   Bayer DCA Hb A1c Waived   TSH    Caryl Pina, MD [facility] 12/04/2021, 8:21 AM

## 2021-12-05 LAB — CBC WITH DIFFERENTIAL/PLATELET
Basophils Absolute: 0.1 10*3/uL (ref 0.0–0.2)
Basos: 1 %
EOS (ABSOLUTE): 0.3 10*3/uL (ref 0.0–0.4)
Eos: 3 %
Hematocrit: 42.8 % (ref 37.5–51.0)
Hemoglobin: 14 g/dL (ref 13.0–17.7)
Immature Grans (Abs): 0 10*3/uL (ref 0.0–0.1)
Immature Granulocytes: 0 %
Lymphocytes Absolute: 1.7 10*3/uL (ref 0.7–3.1)
Lymphs: 17 %
MCH: 29.9 pg (ref 26.6–33.0)
MCHC: 32.7 g/dL (ref 31.5–35.7)
MCV: 91 fL (ref 79–97)
Monocytes Absolute: 0.8 10*3/uL (ref 0.1–0.9)
Monocytes: 7 %
Neutrophils Absolute: 7.4 10*3/uL — ABNORMAL HIGH (ref 1.4–7.0)
Neutrophils: 72 %
Platelets: 301 10*3/uL (ref 150–450)
RBC: 4.69 x10E6/uL (ref 4.14–5.80)
RDW: 13.2 % (ref 11.6–15.4)
WBC: 10.2 10*3/uL (ref 3.4–10.8)

## 2021-12-05 LAB — CMP14+EGFR
ALT: 13 IU/L (ref 0–44)
AST: 16 IU/L (ref 0–40)
Albumin/Globulin Ratio: 2 (ref 1.2–2.2)
Albumin: 4.2 g/dL (ref 3.7–4.7)
Alkaline Phosphatase: 131 IU/L — ABNORMAL HIGH (ref 44–121)
BUN/Creatinine Ratio: 9 — ABNORMAL LOW (ref 10–24)
BUN: 10 mg/dL (ref 8–27)
Bilirubin Total: 0.6 mg/dL (ref 0.0–1.2)
CO2: 27 mmol/L (ref 20–29)
Calcium: 9.5 mg/dL (ref 8.6–10.2)
Chloride: 103 mmol/L (ref 96–106)
Creatinine, Ser: 1.16 mg/dL (ref 0.76–1.27)
Globulin, Total: 2.1 g/dL (ref 1.5–4.5)
Glucose: 73 mg/dL (ref 70–99)
Potassium: 4.5 mmol/L (ref 3.5–5.2)
Sodium: 142 mmol/L (ref 134–144)
Total Protein: 6.3 g/dL (ref 6.0–8.5)
eGFR: 62 mL/min/{1.73_m2} (ref >59)

## 2021-12-05 LAB — LIPID PANEL
Chol/HDL Ratio: 4.8 ratio (ref 0.0–5.0)
Cholesterol, Total: 159 mg/dL (ref 100–199)
HDL: 33 mg/dL — ABNORMAL LOW (ref >39)
LDL Chol Calc (NIH): 100 mg/dL — ABNORMAL HIGH (ref 0–99)
Triglycerides: 145 mg/dL (ref 0–149)
VLDL Cholesterol Cal: 26 mg/dL (ref 5–40)

## 2021-12-05 LAB — TSH: TSH: 3.81 u[IU]/mL (ref 0.450–4.500)

## 2021-12-16 DIAGNOSIS — L570 Actinic keratosis: Secondary | ICD-10-CM | POA: Diagnosis not present

## 2021-12-16 DIAGNOSIS — Z1283 Encounter for screening for malignant neoplasm of skin: Secondary | ICD-10-CM | POA: Diagnosis not present

## 2021-12-16 DIAGNOSIS — D239 Other benign neoplasm of skin, unspecified: Secondary | ICD-10-CM | POA: Diagnosis not present

## 2021-12-16 DIAGNOSIS — Z85828 Personal history of other malignant neoplasm of skin: Secondary | ICD-10-CM | POA: Diagnosis not present

## 2022-02-24 DIAGNOSIS — M170 Bilateral primary osteoarthritis of knee: Secondary | ICD-10-CM | POA: Diagnosis not present

## 2022-05-26 DIAGNOSIS — M170 Bilateral primary osteoarthritis of knee: Secondary | ICD-10-CM | POA: Diagnosis not present

## 2022-05-30 ENCOUNTER — Other Ambulatory Visit: Payer: Self-pay | Admitting: Family Medicine

## 2022-05-30 DIAGNOSIS — E039 Hypothyroidism, unspecified: Secondary | ICD-10-CM

## 2022-05-30 DIAGNOSIS — I10 Essential (primary) hypertension: Secondary | ICD-10-CM

## 2022-06-11 ENCOUNTER — Encounter: Payer: Self-pay | Admitting: Family Medicine

## 2022-06-11 ENCOUNTER — Ambulatory Visit (INDEPENDENT_AMBULATORY_CARE_PROVIDER_SITE_OTHER): Payer: PPO | Admitting: Family Medicine

## 2022-06-11 VITALS — BP 124/75 | HR 68 | Temp 97.8°F | Ht 73.0 in | Wt 169.0 lb

## 2022-06-11 DIAGNOSIS — E538 Deficiency of other specified B group vitamins: Secondary | ICD-10-CM

## 2022-06-11 DIAGNOSIS — E039 Hypothyroidism, unspecified: Secondary | ICD-10-CM

## 2022-06-11 DIAGNOSIS — E782 Mixed hyperlipidemia: Secondary | ICD-10-CM | POA: Diagnosis not present

## 2022-06-11 DIAGNOSIS — R7303 Prediabetes: Secondary | ICD-10-CM | POA: Diagnosis not present

## 2022-06-11 DIAGNOSIS — I10 Essential (primary) hypertension: Secondary | ICD-10-CM

## 2022-06-11 LAB — CBC WITH DIFFERENTIAL/PLATELET
EOS (ABSOLUTE): 0.3 10*3/uL (ref 0.0–0.4)
Eos: 2 %
Immature Grans (Abs): 0 10*3/uL (ref 0.0–0.1)
Immature Granulocytes: 0 %
Lymphocytes Absolute: 1.5 10*3/uL (ref 0.7–3.1)
Monocytes: 7 %
RBC: 4.53 x10E6/uL (ref 4.14–5.80)
RDW: 13 % (ref 11.6–15.4)

## 2022-06-11 LAB — VITAMIN B12

## 2022-06-11 LAB — CMP14+EGFR

## 2022-06-11 LAB — BAYER DCA HB A1C WAIVED: HB A1C (BAYER DCA - WAIVED): 5.7 % — ABNORMAL HIGH (ref 4.8–5.6)

## 2022-06-11 LAB — LIPID PANEL

## 2022-06-11 MED ORDER — AMLODIPINE BESYLATE 2.5 MG PO TABS
2.5000 mg | ORAL_TABLET | Freq: Every day | ORAL | 3 refills | Status: DC
Start: 2022-06-11 — End: 2023-06-16

## 2022-06-11 MED ORDER — PRAVASTATIN SODIUM 20 MG PO TABS: 20.0000 mg | ORAL_TABLET | Freq: Every day | ORAL | 3 refills | Status: DC

## 2022-06-11 MED ORDER — LOSARTAN POTASSIUM 100 MG PO TABS: 100.0000 mg | ORAL_TABLET | Freq: Every day | ORAL | 0 refills | Status: DC

## 2022-06-11 MED ORDER — LEVOTHYROXINE SODIUM 75 MCG PO TABS: 75.0000 ug | ORAL_TABLET | Freq: Every day | ORAL | 0 refills | Status: DC

## 2022-06-11 MED ORDER — AMLODIPINE BESYLATE 5 MG PO TABS: 5.0000 mg | ORAL_TABLET | Freq: Every day | ORAL | 3 refills | Status: DC

## 2022-06-11 NOTE — Progress Notes (Signed)
BP 124/75   Pulse 68   Temp 97.8 F (36.6 C)   Ht 6\' 1"  (1.854 m)   Wt 169 lb (76.7 kg)   SpO2 97%   BMI 22.30 kg/m    Subjective:   Patient ID: James Thomas, male    DOB: [birth year], 88 y.o.   MRN: [id number]  HPI: James Thomas is a 88 y.o. male presenting on 06/11/2022 for Medical Management of Chronic Issues, Hypertension, and Hyperlipidemia   HPI Hypertension Patient is currently on amlodipine 7.5 mg, and their blood pressure today is 124/75. Patient denies any lightheadedness or dizziness. Patient denies headaches, blurred vision, chest pains, shortness of breath, or weakness. Denies any side effects from medication and is content with current medication.   Hyperlipidemia Patient is coming in for recheck of his hyperlipidemia. The patient is currently taking fish oils and pravastatin. They deny any issues with myalgias or history of liver damage from it. They deny any focal numbness or weakness or chest pain.   Hypothyroidism recheck Patient is coming in for thyroid recheck today as well. They deny any issues with hair changes or heat or cold problems or diarrhea or constipation. They deny any chest pain or palpitations. They are currently on levothyroxine 75 micrograms   Prediabetes Patient comes in today for recheck of his diabetes. Patient has been currently taking no medicine currently, diet control. Patient is currently on an ACE inhibitor/ARB. Patient has not seen an ophthalmologist this year. Patient denies any new issues with their feet. The symptom started onset as an adult hypertension and hyperlipidemia and hypothyroidism ARE RELATED TO DM   Relevant past medical, surgical, family and social history reviewed and updated as indicated. Interim medical history since our last visit reviewed. Allergies and medications reviewed and updated.  Review of Systems  Constitutional:  Positive for appetite change and unexpected weight change. Negative for chills and fever.   HENT:  Positive for hearing loss.   Eyes:  Negative for visual disturbance.  Respiratory:  Negative for shortness of breath and wheezing.   Cardiovascular:  Negative for chest pain and leg swelling.  Musculoskeletal:  Negative for back pain and gait problem.  Skin:  Negative for rash.  Neurological:  Negative for dizziness, weakness and numbness.  All other systems reviewed and are negative.   Per HPI unless specifically indicated above   Allergies as of 06/11/2022       Reactions   Fenofibrate Cough   Meloxicam    Unsure but thinks it gave him a rash   Niacin And Related Itching, Rash        Medication List        Accurate as of June 11, 2022  8:21 AM. If you have any questions, ask your nurse or doctor.          amLODipine 5 MG tablet Commonly known as: NORVASC Take 1 tablet (5 mg total) by mouth daily. Take 2.5 and 5 mg together for a total of 7.5 mg What changed:  how much to take how to take this when to take this additional instructions Changed by: Elige Radon Madigan Rosensteel, MD   amLODipine 2.5 MG tablet Commonly known as: NORVASC Take 1 tablet (2.5 mg total) by mouth daily. Take 2.5 and 5 mg together for a total of 7.5 mg What changed: You were already taking a medication with the same name, and this prescription was added. Make sure you understand how and when to take each. Changed by:  Elige Radon Rovena Hearld, MD   B-12 500 MCG Tabs Take 1 tablet by mouth at bedtime.   CENTRUM SILVER 50+MEN PO Take 1 tablet by mouth daily.   cholecalciferol 1000 units tablet Commonly known as: VITAMIN D Take 2,000 Units by mouth daily.   CITRACAL + D PO Take by mouth daily.   Fish Oil 1200 MG Caps Take 2 capsules by mouth daily.   levothyroxine 75 MCG tablet Commonly known as: SYNTHROID Take 1 tablet (75 mcg total) by mouth daily.   losartan 100 MG tablet Commonly known as: COZAAR Take 1 tablet (100 mg total) by mouth daily.   omeprazole 40 MG capsule Commonly  known as: PRILOSEC Take 1 capsule (40 mg total) by mouth daily.   polyethylene glycol 17 g packet Commonly known as: MIRALAX / GLYCOLAX Take 17 g by mouth at bedtime.   pravastatin 20 MG tablet Commonly known as: PRAVACHOL Take 1 tablet (20 mg total) by mouth daily.         Objective:   BP 124/75   Pulse 68   Temp 97.8 F (36.6 C)   Ht 6\' 1"  (1.854 m)   Wt 169 lb (76.7 kg)   SpO2 97%   BMI 22.30 kg/m   Wt Readings from Last 3 Encounters:  06/11/22 169 lb (76.7 kg)  12/04/21 173 lb (78.5 kg)  10/08/21 174 lb (78.9 kg)    Physical Exam Vitals and nursing note reviewed.  Constitutional:      General: He is not in acute distress.    Appearance: He is well-developed. He is not diaphoretic.  Eyes:     General: No scleral icterus.    Conjunctiva/sclera: Conjunctivae normal.  Neck:     Thyroid: No thyromegaly.  Cardiovascular:     Rate and Rhythm: Normal rate and regular rhythm.     Heart sounds: Normal heart sounds. No murmur heard. Pulmonary:     Effort: Pulmonary effort is normal. No respiratory distress.     Breath sounds: Normal breath sounds. No wheezing.  Musculoskeletal:        General: No swelling. Normal range of motion.     Cervical back: Neck supple.  Lymphadenopathy:     Cervical: No cervical adenopathy.  Skin:    General: Skin is warm and dry.     Findings: No rash.  Neurological:     Mental Status: He is alert and oriented to person, place, and time.     Coordination: Coordination normal.  Psychiatric:        Behavior: Behavior normal.       Assessment & Plan:   Problem List Items Addressed This Visit       Cardiovascular and Mediastinum   HTN (hypertension) - Primary   Relevant Medications   losartan (COZAAR) 100 MG tablet   pravastatin (PRAVACHOL) 20 MG tablet   amLODipine (NORVASC) 5 MG tablet   amLODipine (NORVASC) 2.5 MG tablet   Other Relevant Orders   CBC with Differential/Platelet   CMP14+EGFR     Endocrine    Hypothyroid   Relevant Medications   levothyroxine (SYNTHROID) 75 MCG tablet   Other Relevant Orders   TSH     Other   HLD (hyperlipidemia)   Relevant Medications   losartan (COZAAR) 100 MG tablet   pravastatin (PRAVACHOL) 20 MG tablet   amLODipine (NORVASC) 5 MG tablet   amLODipine (NORVASC) 2.5 MG tablet   Other Relevant Orders   CBC with Differential/Platelet   CMP14+EGFR   Lipid  panel   Prediabetes   Relevant Orders   Bayer DCA Hb A1c Waived   B12 deficiency   Relevant Orders   Vitamin B12    Daughter's biggest concern is that he is down a little bit on the weight, is down about 3 or 4 pounds from his baseline and she feels like maybe his appetite is slightly less, she wants to go ahead and try and do some Ensure or boost and we approved for that.  Patient coming in for recheck of B12, takes oral B12  He does still see orthopedics for his knees.  Will check blood work today Follow up plan: Return in about 6 months (around 12/11/2022), or if symptoms worsen or fail to improve, for Hypothyroidism and prediabetes and hypertension.  Counseling provided for all of the vaccine components Orders Placed This Encounter  Procedures   Bayer DCA Hb A1c Waived   CBC with Differential/Platelet   CMP14+EGFR   Lipid panel   TSH   Vitamin B12    Arville Care, MD [facility] 06/11/2022, 8:21 AM

## 2022-06-12 LAB — CMP14+EGFR
ALT: 11 IU/L (ref 0–44)
Albumin/Globulin Ratio: 2.2 (ref 1.2–2.2)
Alkaline Phosphatase: 107 IU/L (ref 44–121)
BUN/Creatinine Ratio: 10 (ref 10–24)
BUN: 12 mg/dL (ref 8–27)
Bilirubin Total: 0.8 mg/dL (ref 0.0–1.2)
CO2: 25 mmol/L (ref 20–29)
Calcium: 9.7 mg/dL (ref 8.6–10.2)
Globulin, Total: 1.9 g/dL (ref 1.5–4.5)
Glucose: 98 mg/dL (ref 70–99)
Sodium: 143 mmol/L (ref 134–144)
Total Protein: 6.1 g/dL (ref 6.0–8.5)

## 2022-06-12 LAB — CBC WITH DIFFERENTIAL/PLATELET
Basophils Absolute: 0.1 10*3/uL (ref 0.0–0.2)
Basos: 1 %
Hematocrit: 41.7 % (ref 37.5–51.0)
Hemoglobin: 13.8 g/dL (ref 13.0–17.7)
Lymphs: 14 %
MCH: 30.5 pg (ref 26.6–33.0)
MCHC: 33.1 g/dL (ref 31.5–35.7)
MCV: 92 fL (ref 79–97)
Monocytes Absolute: 0.8 10*3/uL (ref 0.1–0.9)
Neutrophils Absolute: 8.5 10*3/uL — ABNORMAL HIGH (ref 1.4–7.0)
Neutrophils: 76 %
Platelets: 281 10*3/uL (ref 150–450)
WBC: 11.2 10*3/uL — ABNORMAL HIGH (ref 3.4–10.8)

## 2022-06-12 LAB — LIPID PANEL
Chol/HDL Ratio: 5.3 ratio — ABNORMAL HIGH (ref 0.0–5.0)
Cholesterol, Total: 170 mg/dL (ref 100–199)
HDL: 32 mg/dL — ABNORMAL LOW (ref >39)
LDL Chol Calc (NIH): 105 mg/dL — ABNORMAL HIGH (ref 0–99)
VLDL Cholesterol Cal: 33 mg/dL (ref 5–40)

## 2022-06-12 LAB — TSH: TSH: 3.2 u[IU]/mL (ref 0.450–4.500)

## 2022-06-16 DIAGNOSIS — D485 Neoplasm of uncertain behavior of skin: Secondary | ICD-10-CM | POA: Diagnosis not present

## 2022-06-16 DIAGNOSIS — Z1283 Encounter for screening for malignant neoplasm of skin: Secondary | ICD-10-CM | POA: Diagnosis not present

## 2022-06-16 DIAGNOSIS — L570 Actinic keratosis: Secondary | ICD-10-CM | POA: Diagnosis not present

## 2022-06-16 DIAGNOSIS — Z85828 Personal history of other malignant neoplasm of skin: Secondary | ICD-10-CM | POA: Diagnosis not present

## 2022-07-06 ENCOUNTER — Encounter: Payer: Self-pay | Admitting: Family Medicine

## 2022-07-07 DIAGNOSIS — M170 Bilateral primary osteoarthritis of knee: Secondary | ICD-10-CM | POA: Diagnosis not present

## 2022-07-14 DIAGNOSIS — M170 Bilateral primary osteoarthritis of knee: Secondary | ICD-10-CM | POA: Diagnosis not present

## 2022-07-22 DIAGNOSIS — M170 Bilateral primary osteoarthritis of knee: Secondary | ICD-10-CM | POA: Diagnosis not present

## 2022-07-24 ENCOUNTER — Other Ambulatory Visit: Payer: Self-pay | Admitting: Family Medicine

## 2022-07-24 DIAGNOSIS — I10 Essential (primary) hypertension: Secondary | ICD-10-CM

## 2022-11-16 ENCOUNTER — Encounter: Payer: Self-pay | Admitting: Family Medicine

## 2022-11-20 ENCOUNTER — Other Ambulatory Visit: Payer: Self-pay | Admitting: Family Medicine

## 2022-11-20 DIAGNOSIS — E039 Hypothyroidism, unspecified: Secondary | ICD-10-CM

## 2022-11-20 DIAGNOSIS — I10 Essential (primary) hypertension: Secondary | ICD-10-CM

## 2022-12-13 ENCOUNTER — Encounter: Payer: Self-pay | Admitting: Family Medicine

## 2022-12-13 ENCOUNTER — Ambulatory Visit (INDEPENDENT_AMBULATORY_CARE_PROVIDER_SITE_OTHER): Payer: PPO | Admitting: Family Medicine

## 2022-12-13 VITALS — BP 128/72 | HR 73 | Ht 73.0 in | Wt 164.0 lb

## 2022-12-13 DIAGNOSIS — I10 Essential (primary) hypertension: Secondary | ICD-10-CM | POA: Diagnosis not present

## 2022-12-13 DIAGNOSIS — D1801 Hemangioma of skin and subcutaneous tissue: Secondary | ICD-10-CM | POA: Diagnosis not present

## 2022-12-13 DIAGNOSIS — R7303 Prediabetes: Secondary | ICD-10-CM

## 2022-12-13 DIAGNOSIS — E538 Deficiency of other specified B group vitamins: Secondary | ICD-10-CM

## 2022-12-13 DIAGNOSIS — Z85828 Personal history of other malignant neoplasm of skin: Secondary | ICD-10-CM | POA: Diagnosis not present

## 2022-12-13 DIAGNOSIS — Z23 Encounter for immunization: Secondary | ICD-10-CM | POA: Diagnosis not present

## 2022-12-13 DIAGNOSIS — E782 Mixed hyperlipidemia: Secondary | ICD-10-CM

## 2022-12-13 DIAGNOSIS — E039 Hypothyroidism, unspecified: Secondary | ICD-10-CM

## 2022-12-13 DIAGNOSIS — L570 Actinic keratosis: Secondary | ICD-10-CM | POA: Diagnosis not present

## 2022-12-13 LAB — BAYER DCA HB A1C WAIVED: HB A1C (BAYER DCA - WAIVED): 5.5 % (ref 4.8–5.6)

## 2022-12-13 LAB — LIPID PANEL

## 2022-12-13 MED ORDER — AMLODIPINE BESYLATE 5 MG PO TABS: 5.0000 mg | ORAL_TABLET | Freq: Every day | ORAL | 3 refills | Status: DC

## 2022-12-13 MED ORDER — OMEPRAZOLE 40 MG PO CPDR: 40.0000 mg | DELAYED_RELEASE_CAPSULE | Freq: Every day | ORAL | 3 refills | Status: DC

## 2022-12-13 MED ORDER — LEVOTHYROXINE SODIUM 75 MCG PO TABS: 75.0000 ug | ORAL_TABLET | Freq: Every day | ORAL | 3 refills | Status: DC

## 2022-12-13 MED ORDER — LOSARTAN POTASSIUM 100 MG PO TABS: 100.0000 mg | ORAL_TABLET | Freq: Every day | ORAL | 3 refills | Status: DC

## 2022-12-13 NOTE — Progress Notes (Signed)
BP 128/72   Pulse 73   Ht 6\' 1"  (1.854 m)   Wt 164 lb (74.4 kg)   SpO2 98%   BMI 21.64 kg/m    Subjective:   Patient ID: James Thomas, male    DOB: [birth year], 88 y.o.   MRN: [id number]  HPI: James Thomas is a 88 y.o. male presenting on 12/13/2022 for Medical Management of Chronic Issues, Hyperlipidemia, Hypertension, and Hypothyroidism   HPI Hypertension Patient is currently on amlodipine and losartan, and their blood pressure today is 128/72. Patient denies any lightheadedness or dizziness. Patient denies headaches, blurred vision, chest pains, shortness of breath, or weakness. Denies any side effects from medication and is content with current medication.   Hyperlipidemia Patient is coming in for recheck of his hyperlipidemia. The patient is currently taking pravastatin and fish oils. They deny any issues with myalgias or history of liver damage from it. They deny any focal numbness or weakness or chest pain.   Hypothyroidism recheck Patient is coming in for thyroid recheck today as well. They deny any issues with hair changes or heat or cold problems or diarrhea or constipation. They deny any chest pain or palpitations. They are currently on levothyroxine 75 micrograms   B12 deficiency Patient is coming in for B12 deficiency recheck today.  Memory disturbance Patient has memory disturbance/suggestion for early dementia.  Relevant past medical, surgical, family and social history reviewed and updated as indicated. Interim medical history since our last visit reviewed. Allergies and medications reviewed and updated.  Review of Systems  Constitutional:  Negative for chills and fever.  Eyes:  Negative for visual disturbance.  Respiratory:  Negative for shortness of breath and wheezing.   Cardiovascular:  Negative for chest pain and leg swelling.  Skin:  Negative for rash.  Neurological:  Negative for dizziness, weakness and light-headedness.  Psychiatric/Behavioral:   Positive for confusion.   All other systems reviewed and are negative.   Per HPI unless specifically indicated above   Allergies as of 12/13/2022       Reactions   Fenofibrate Cough   Meloxicam    Unsure but thinks it gave him a rash   Niacin And Related Itching, Rash        Medication List        Accurate as of December 13, 2022  9:04 AM. If you have any questions, ask your nurse or doctor.          amLODipine 2.5 MG tablet Commonly known as: NORVASC Take 1 tablet (2.5 mg total) by mouth daily. Take 2.5 and 5 mg together for a total of 7.5 mg What changed: Another medication with the same name was changed. Make sure you understand how and when to take each. Changed by: Elige Radon Sullivan Blasing   amLODipine 5 MG tablet Commonly known as: NORVASC Take 1 tablet (5 mg total) by mouth daily. Take with 2.5 mg for total of 7.5 mg daily What changed: See the new instructions. Changed by: Elige Radon Zakyah Yanes   B-12 500 MCG Tabs Take 1 tablet by mouth at bedtime.   CENTRUM SILVER 50+MEN PO Take 1 tablet by mouth daily.   cholecalciferol 1000 units tablet Commonly known as: VITAMIN D Take 2,000 Units by mouth daily.   CITRACAL + D PO Take by mouth daily.   Fish Oil 1200 MG Caps Take 2 capsules by mouth daily.   levothyroxine 75 MCG tablet Commonly known as: SYNTHROID Take 1 tablet (75 mcg total) by  mouth daily.   losartan 100 MG tablet Commonly known as: COZAAR Take 1 tablet (100 mg total) by mouth daily.   omeprazole 40 MG capsule Commonly known as: PRILOSEC Take 1 capsule (40 mg total) by mouth daily.   polyethylene glycol 17 g packet Commonly known as: MIRALAX / GLYCOLAX Take 17 g by mouth at bedtime.   pravastatin 20 MG tablet Commonly known as: PRAVACHOL Take 1 tablet (20 mg total) by mouth daily.         Objective:   BP 128/72   Pulse 73   Ht 6\' 1"  (1.854 m)   Wt 164 lb (74.4 kg)   SpO2 98%   BMI 21.64 kg/m   Wt Readings from Last 3  Encounters:  12/13/22 164 lb (74.4 kg)  06/11/22 169 lb (76.7 kg)  12/04/21 173 lb (78.5 kg)    Physical Exam Vitals and nursing note reviewed.  Constitutional:      General: He is not in acute distress.    Appearance: He is well-developed. He is not diaphoretic.  Eyes:     General: No scleral icterus.    Conjunctiva/sclera: Conjunctivae normal.  Neck:     Thyroid: No thyromegaly.  Cardiovascular:     Rate and Rhythm: Normal rate and regular rhythm.     Heart sounds: Normal heart sounds. No murmur heard. Pulmonary:     Effort: Pulmonary effort is normal. No respiratory distress.     Breath sounds: Normal breath sounds. No wheezing.  Abdominal:     General: Abdomen is flat. Bowel sounds are normal. There is no distension.     Tenderness: There is no abdominal tenderness. There is no guarding or rebound.  Musculoskeletal:        General: Normal range of motion.     Cervical back: Neck supple.  Lymphadenopathy:     Cervical: No cervical adenopathy.  Skin:    General: Skin is warm and dry.     Findings: No rash.  Neurological:     Mental Status: He is alert and oriented to person, place, and time.     Coordination: Coordination normal.  Psychiatric:        Behavior: Behavior normal.       Assessment & Plan:   Problem List Items Addressed This Visit       Cardiovascular and Mediastinum   HTN (hypertension) - Primary   Relevant Medications   losartan (COZAAR) 100 MG tablet   amLODipine (NORVASC) 5 MG tablet   Other Relevant Orders   CBC with Differential/Platelet   CMP14+EGFR     Endocrine   Hypothyroid   Relevant Medications   levothyroxine (SYNTHROID) 75 MCG tablet   Other Relevant Orders   TSH     Other   HLD (hyperlipidemia)   Relevant Medications   losartan (COZAAR) 100 MG tablet   amLODipine (NORVASC) 5 MG tablet   Other Relevant Orders   Lipid panel   Prediabetes   Relevant Orders   Bayer DCA Hb A1c Waived   B12 deficiency   Relevant Orders    Vitamin B12    Will check blood work today.  He seems to be doing okay except for a little bit of intermittent constipation and diarrhea which they can use Imodium and MiraLAX for.  Talked about some dietary changes and increasing fiber that might help as well. Follow up plan: Return in about 6 months (around 06/13/2023), or if symptoms worsen or fail to improve, for Physical exam and hypertension  and hyperlipidemia and thyroid check.  Counseling provided for all of the vaccine components Orders Placed This Encounter  Procedures   Bayer DCA Hb A1c Waived   CBC with Differential/Platelet   CMP14+EGFR   Lipid panel   TSH   Vitamin B12    Arville Care, MD [place] 12/13/2022, 9:04 AM

## 2022-12-14 LAB — CMP14+EGFR
ALT: 18 IU/L (ref 0–44)
AST: 18 IU/L (ref 0–40)
Albumin: 4.2 g/dL (ref 3.7–4.7)
Alkaline Phosphatase: 104 IU/L (ref 44–121)
BUN/Creatinine Ratio: 13 (ref 10–24)
BUN: 17 mg/dL (ref 8–27)
Bilirubin Total: 0.5 mg/dL (ref 0.0–1.2)
CO2: 23 mmol/L (ref 20–29)
Calcium: 9.5 mg/dL (ref 8.6–10.2)
Chloride: 106 mmol/L (ref 96–106)
Creatinine, Ser: 1.29 mg/dL — ABNORMAL HIGH (ref 0.76–1.27)
Globulin, Total: 2.2 g/dL (ref 1.5–4.5)
Glucose: 185 mg/dL — ABNORMAL HIGH (ref 70–99)
Potassium: 4.1 mmol/L (ref 3.5–5.2)
Sodium: 143 mmol/L (ref 134–144)
Total Protein: 6.4 g/dL (ref 6.0–8.5)
eGFR: 54 mL/min/{1.73_m2} — ABNORMAL LOW (ref >59)

## 2022-12-14 LAB — CBC WITH DIFFERENTIAL/PLATELET
Basophils Absolute: 0.1 10*3/uL (ref 0.0–0.2)
Basos: 1 %
EOS (ABSOLUTE): 0.4 10*3/uL (ref 0.0–0.4)
Eos: 5 %
Hematocrit: 41.7 % (ref 37.5–51.0)
Hemoglobin: 14 g/dL (ref 13.0–17.7)
Immature Grans (Abs): 0 10*3/uL (ref 0.0–0.1)
Immature Granulocytes: 0 %
Lymphocytes Absolute: 1.5 10*3/uL (ref 0.7–3.1)
Lymphs: 20 %
MCH: 31.3 pg (ref 26.6–33.0)
MCHC: 33.6 g/dL (ref 31.5–35.7)
MCV: 93 fL (ref 79–97)
Monocytes Absolute: 0.5 10*3/uL (ref 0.1–0.9)
Monocytes: 7 %
Neutrophils Absolute: 5.1 10*3/uL (ref 1.4–7.0)
Neutrophils: 67 %
Platelets: 290 10*3/uL (ref 150–450)
RBC: 4.48 x10E6/uL (ref 4.14–5.80)
RDW: 12.6 % (ref 11.6–15.4)
WBC: 7.6 10*3/uL (ref 3.4–10.8)

## 2022-12-14 LAB — LIPID PANEL
Cholesterol, Total: 136 mg/dL (ref 100–199)
HDL: 35 mg/dL — ABNORMAL LOW (ref >39)
LDL CALC COMMENT:: 3.9 ratio (ref 0.0–5.0)
LDL Chol Calc (NIH): 82 mg/dL (ref 0–99)
Triglycerides: 99 mg/dL (ref 0–149)
VLDL Cholesterol Cal: 19 mg/dL (ref 5–40)

## 2022-12-14 LAB — TSH: TSH: 2.97 u[IU]/mL (ref 0.450–4.500)

## 2022-12-14 LAB — VITAMIN B12: Vitamin B-12: 533 pg/mL (ref 232–1245)

## 2022-12-16 ENCOUNTER — Encounter: Payer: Self-pay | Admitting: Family Medicine

## 2023-01-28 DIAGNOSIS — M170 Bilateral primary osteoarthritis of knee: Secondary | ICD-10-CM | POA: Diagnosis not present

## 2023-02-01 ENCOUNTER — Encounter: Payer: Self-pay | Admitting: Family Medicine

## 2023-02-03 ENCOUNTER — Ambulatory Visit: Payer: PPO | Admitting: Family Medicine

## 2023-02-03 ENCOUNTER — Encounter: Payer: Self-pay | Admitting: Family Medicine

## 2023-02-03 VITALS — BP 123/66 | HR 77 | Temp 98.3°F | Ht 73.0 in | Wt 158.0 lb

## 2023-02-03 DIAGNOSIS — M25552 Pain in left hip: Secondary | ICD-10-CM

## 2023-02-03 MED ORDER — PREDNISONE 20 MG PO TABS: 40.0000 mg | ORAL_TABLET | Freq: Every day | ORAL | 0 refills | Status: AC

## 2023-02-03 NOTE — Progress Notes (Signed)
Subjective:  Patient ID: James Thomas, male    DOB: 22-Jul-1934, 89 y.o.   MRN: [id number]  Patient Care Team: Dettinger, Elige Radon, MD as PCP - General (Family Medicine) Marcelino Duster, MD as Referring Physician (Dermatology) West Bali, MD (Inactive) as Consulting Physician (Gastroenterology) Oretha Milch, MD as Consulting Physician (Pulmonary Disease) Van Clines, MD as Consulting Physician (Neurology) Rollene Rotunda, MD as Consulting Physician (Cardiology)   Chief Complaint:  Hip Pain  HPI: James Thomas is a 89 y.o. male presenting on 02/03/2023 for Hip Pain Started on Monday, denies trauma, states that it feels like a pinched nerve.  States that he had history of two back surgeries for disc herniation, 1979 and 1985. Has not followed up with surgery in several years. Reports that lying down makes it worse.  No recent URI, no fever  Has been naprosyn 2 times daily and using salon pas patches, which are not helping.  Has not started alternating tylenol.  States that he had bilateral knee injections with [facility] last Friday with Depo-Medrol.    Relevant past medical, surgical, family, and social history reviewed and updated as indicated.  Allergies and medications reviewed and updated. Data reviewed: Chart in Epic.   Past Medical History:  Diagnosis Date   Arthritis    B12 deficiency    On injections   GERD (gastroesophageal reflux disease)    Hypercholesteremia    Hypertension    Hypothyroidism    Osteoporosis     Past Surgical History:  Procedure Laterality Date   BACK SURGERY  1979;1985   slipped disc;ruptured disc   CATARACT EXTRACTION W/PHACO  02/01/2011   Procedure: CATARACT EXTRACTION PHACO AND INTRAOCULAR LENS PLACEMENT (IOC);  Surgeon: Gemma Payor, MD;  Location: [facility];  Service: Ophthalmology;  Laterality: Right;  CDE:18.53    Social History   Socioeconomic History   Marital status: Widowed    Spouse name: Kathie Rhodes   Number of  children: 1   Years of education: 14 years   Highest education level: Associate degree: occupational, Scientist, product/process development, or vocational program  Occupational History   Occupation: Retired    Comment: Games developer, bodywork  Tobacco Use   Smoking status: Former    Current packs/day: 0.00    Average packs/day: 1 pack/day for 10.0 years (10.0 ttl pk-yrs)    Types: Cigarettes    Start date: 01/26/1962    Quit date: 01/27/1972    Years since quitting: 51.0   Smokeless tobacco: Never  Vaping Use   Vaping status: Never Used  Substance and Sexual Activity   Alcohol use: No   Drug use: No   Sexual activity: Not Currently  Other Topics Concern   Not on file  Social History Narrative   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 He has been retired for about 4 years. He is married and lives in a one story home with a basement with his wife. They have one adult daughter and two granddaughters.    Social Drivers of Corporate investment banker Strain: Low Risk  (02/06/2020)   Overall Financial Resource Strain (CARDIA)    Difficulty of Paying Living Expenses: Not hard at all  Food Insecurity: No Food Insecurity (02/06/2020)   Hunger Vital Sign    Worried About Running Out of Food in the Last Year: Never true    Ran Out of Food in the Last Year: Never true  Transportation Needs: No Transportation Needs (02/06/2020)   PRAPARE -  Administrator, Civil Service (Medical): No    Lack of Transportation (Non-Medical): No  Physical Activity: Inactive (02/06/2020)   Exercise Vital Sign    Days of Exercise per Week: 0 days    Minutes of Exercise per Session: 0 min  Stress: No Stress Concern Present (02/06/2020)   [employer] of Occupational Health - Occupational Stress Questionnaire    Feeling of Stress : Not at all  Social Connections: Socially Integrated (02/06/2020)   Social Connection and Isolation Panel [NHANES]    Frequency of Communication with Friends and Family: More than three times a week     Frequency of Social Gatherings with Friends and Family: More than three times a week    Attends Religious Services: More than 4 times per year    Active Member of [employer] or Organizations: Yes    Attends Banker Meetings: Not on file    Marital Status: Married  Intimate Partner Violence: Not At Risk (02/06/2020)   Humiliation, Afraid, Rape, and Kick questionnaire    Fear of Current or Ex-Partner: No    Emotionally Abused: No    Physically Abused: No    Sexually Abused: No    Outpatient Encounter Medications as of 02/03/2023  Medication Sig   amLODipine (NORVASC) 2.5 MG tablet Take 1 tablet (2.5 mg total) by mouth daily. Take 2.5 and 5 mg together for a total of 7.5 mg   amLODipine (NORVASC) 5 MG tablet Take 1 tablet (5 mg total) by mouth daily. Take with 2.5 mg for total of 7.5 mg daily   Calcium Citrate-Vitamin D (CITRACAL + D PO) Take by mouth daily.   cholecalciferol (VITAMIN D) 1000 UNITS tablet Take 2,000 Units by mouth daily.    Cyanocobalamin (B-12) 500 MCG TABS Take 1 tablet by mouth at bedtime.   levothyroxine (SYNTHROID) 75 MCG tablet Take 1 tablet (75 mcg total) by mouth daily.   losartan (COZAAR) 100 MG tablet Take 1 tablet (100 mg total) by mouth daily.   Multiple Vitamins-Minerals (CENTRUM SILVER 50+MEN PO) Take 1 tablet by mouth daily.   Omega-3 Fatty Acids (FISH OIL) 1200 MG CAPS Take 2 capsules by mouth daily.   omeprazole (PRILOSEC) 40 MG capsule Take 1 capsule (40 mg total) by mouth daily.   polyethylene glycol (MIRALAX / GLYCOLAX) packet Take 17 g by mouth at bedtime.   pravastatin (PRAVACHOL) 20 MG tablet Take 1 tablet (20 mg total) by mouth daily.   No facility-administered encounter medications on file as of 02/03/2023.    Allergies  Allergen Reactions   Fenofibrate Cough   Meloxicam     Unsure but thinks it gave him a rash   Niacin And Related Itching and Rash    Review of Systems As per HPI  Objective:  BP 123/66   Pulse 77   Temp 98.3 F  (36.8 C)   Ht 6\' 1"  (1.854 m)   Wt 158 lb (71.7 kg)   SpO2 98%   BMI 20.85 kg/m    Wt Readings from Last 3 Encounters:  02/03/23 158 lb (71.7 kg)  12/13/22 164 lb (74.4 kg)  06/11/22 169 lb (76.7 kg)    Physical Exam Constitutional:      General: He is awake. He is not in acute distress.    Appearance: Normal appearance. He is well-developed and well-groomed. He is not ill-appearing, toxic-appearing or diaphoretic.  Cardiovascular:     Rate and Rhythm: Normal rate and regular rhythm.  Pulses: Normal pulses.          Radial pulses are 2+ on the right side and 2+ on the left side.       Posterior tibial pulses are 2+ on the right side and 2+ on the left side.     Heart sounds: Normal heart sounds. No murmur heard.    No gallop.  Pulmonary:     Effort: Pulmonary effort is normal. No respiratory distress.     Breath sounds: Normal breath sounds. No stridor. No wheezing, rhonchi or rales.  Musculoskeletal:     Cervical back: Full passive range of motion without pain and neck supple.     Left hip: Tenderness present. No deformity, lacerations, bony tenderness or crepitus. Decreased range of motion. Normal strength.     Right lower leg: No edema.     Left lower leg: No edema.     Comments: Decreased ROM due to pain. Tender to palpation left lateral hip below iliac crest   Skin:    General: Skin is warm.     Capillary Refill: Capillary refill takes less than 2 seconds.  Neurological:     General: No focal deficit present.     Mental Status: He is alert, oriented to person, place, and time and easily aroused. Mental status is at baseline.     GCS: GCS eye subscore is 4. GCS verbal subscore is 5. GCS motor subscore is 6.     Motor: No weakness.  Psychiatric:        Attention and Perception: Attention and perception normal.        Mood and Affect: Mood and affect normal.        Speech: Speech normal.        Behavior: Behavior normal. Behavior is cooperative.        Thought  Content: Thought content normal. Thought content does not include homicidal or suicidal ideation. Thought content does not include homicidal or suicidal plan.        Cognition and Memory: Cognition and memory normal.        Judgment: Judgment normal.     Results for orders placed or performed in visit on 12/13/22  Bayer DCA Hb A1c Waived   Collection Time: 12/13/22  9:10 AM  Result Value Ref Range   HB A1C (BAYER DCA - WAIVED) 5.5 4.8 - 5.6 %  CBC with Differential/Platelet   Collection Time: 12/13/22  9:15 AM  Result Value Ref Range   WBC 7.6 3.4 - 10.8 x10E3/uL   RBC 4.48 4.14 - 5.80 x10E6/uL   Hemoglobin 14.0 13.0 - 17.7 g/dL   Hematocrit 16.1 09.6 - 51.0 %   MCV 93 79 - 97 fL   MCH 31.3 26.6 - 33.0 pg   MCHC 33.6 31.5 - 35.7 g/dL   RDW 04.5 40.9 - 81.1 %   Platelets 290 150 - 450 x10E3/uL   Neutrophils 67 Not Estab. %   Lymphs 20 Not Estab. %   Monocytes 7 Not Estab. %   Eos 5 Not Estab. %   Basos 1 Not Estab. %   Neutrophils Absolute 5.1 1.4 - 7.0 x10E3/uL   Lymphocytes Absolute 1.5 0.7 - 3.1 x10E3/uL   Monocytes Absolute 0.5 0.1 - 0.9 x10E3/uL   EOS (ABSOLUTE) 0.4 0.0 - 0.4 x10E3/uL   Basophils Absolute 0.1 0.0 - 0.2 x10E3/uL   Immature Granulocytes 0 Not Estab. %   Immature Grans (Abs) 0.0 0.0 - 0.1 x10E3/uL  CMP14+EGFR   Collection  Time: 12/13/22  9:15 AM  Result Value Ref Range   Glucose 185 (H) 70 - 99 mg/dL   BUN 17 8 - 27 mg/dL   Creatinine, Ser 1.61 (H) 0.76 - 1.27 mg/dL   eGFR 54 (L) >09 UE/AVW/0.98   BUN/Creatinine Ratio 13 10 - 24   Sodium 143 134 - 144 mmol/L   Potassium 4.1 3.5 - 5.2 mmol/L   Chloride 106 96 - 106 mmol/L   CO2 23 20 - 29 mmol/L   Calcium 9.5 8.6 - 10.2 mg/dL   Total Protein 6.4 6.0 - 8.5 g/dL   Albumin 4.2 3.7 - 4.7 g/dL   Globulin, Total 2.2 1.5 - 4.5 g/dL   Bilirubin Total 0.5 0.0 - 1.2 mg/dL   Alkaline Phosphatase 104 44 - 121 IU/L   AST 18 0 - 40 IU/L   ALT 18 0 - 44 IU/L  Lipid panel   Collection Time: 12/13/22  9:15 AM   Result Value Ref Range   Cholesterol, Total 136 100 - 199 mg/dL   Triglycerides 99 0 - 149 mg/dL   HDL 35 (L) >11 mg/dL   VLDL Cholesterol Cal 19 5 - 40 mg/dL   LDL Chol Calc (NIH) 82 0 - 99 mg/dL   Chol/HDL Ratio 3.9 0.0 - 5.0 ratio  TSH   Collection Time: 12/13/22  9:15 AM  Result Value Ref Range   TSH 2.970 0.450 - 4.500 uIU/mL  Vitamin B12   Collection Time: 12/13/22  9:15 AM  Result Value Ref Range   Vitamin B-12 533 232 - 1,245 pg/mL       12/13/2022    8:42 AM 12/13/2022    8:41 AM 06/11/2022    8:07 AM 06/11/2022    8:06 AM 12/04/2021    8:13 AM  Depression screen PHQ 2/9  Decreased Interest  0  0 0  Down, Depressed, Hopeless  0  0 0  PHQ - 2 Score  0  0 0  Altered sleeping 0  0  0  Tired, decreased energy 0  0  0  Change in appetite 0  0  0  Feeling bad or failure about yourself  0  0  0  Trouble concentrating 0  0  0  Moving slowly or fidgety/restless 0  0  0  Suicidal thoughts 0  0  0  PHQ-9 Score     0  Difficult doing work/chores Not difficult at all  Not difficult at all  Not difficult at all       12/13/2022    8:41 AM 06/11/2022    8:06 AM 12/04/2021    8:14 AM 10/08/2021    1:18 PM  GAD 7 : Generalized Anxiety Score  Nervous, Anxious, on Edge 0 0 0 0  Control/stop worrying 0 0 0 0  Worry too much - different things 0 0 0 0  Trouble relaxing 0 0 0 0  Restless 0 0 0 0  Easily annoyed or irritable 0 0 0 0  Afraid - awful might happen 0 0 0 0  Total GAD 7 Score 0 0 0 0  Anxiety Difficulty Not difficult at all Not difficult at all Not difficult at all Not difficult at all   Pertinent labs & imaging results that were available during my care of the patient were reviewed by me and considered in my medical decision making.  Assessment & Plan:  Montee was seen today for hip pain.  Diagnoses and all orders for this  visit:  Left hip pain Will start medication as below. Patient does not have history of diabetes and is currently using NSAIDs and tylenol. Xray  not available in office today. If pain continues, can return and consider imaging.  -     predniSONE (DELTASONE) 20 MG tablet; Take 2 tablets (40 mg total) by mouth daily with breakfast for 5 days.  Continue all other maintenance medications.  Follow up plan: Return if symptoms worsen or fail to improve.   Continue healthy lifestyle choices, including diet (rich in fruits, vegetables, and lean proteins, and low in salt and simple carbohydrates) and exercise (at least 30 minutes of moderate physical activity daily).  Written and verbal instructions provided   The above assessment and management plan was discussed with the patient. The patient verbalized understanding of and has agreed to the management plan. Patient is aware to call the clinic if they develop any new symptoms or if symptoms persist or worsen. Patient is aware when to return to the clinic for a follow-up visit. Patient educated on when it is appropriate to go to the emergency department.   Neale Burly, DNP-FNP [facility] [address] [place], [place] [address] [phone]

## 2023-05-10 DIAGNOSIS — M170 Bilateral primary osteoarthritis of knee: Secondary | ICD-10-CM | POA: Diagnosis not present

## 2023-06-06 ENCOUNTER — Other Ambulatory Visit: Payer: Self-pay | Admitting: Family Medicine

## 2023-06-06 DIAGNOSIS — E782 Mixed hyperlipidemia: Secondary | ICD-10-CM

## 2023-06-15 ENCOUNTER — Ambulatory Visit: Payer: PPO | Admitting: Family Medicine

## 2023-06-15 ENCOUNTER — Encounter: Payer: Self-pay | Admitting: Family Medicine

## 2023-06-15 VITALS — BP 125/70 | HR 74 | Temp 97.8°F | Ht 73.0 in | Wt 158.6 lb

## 2023-06-15 DIAGNOSIS — Z0001 Encounter for general adult medical examination with abnormal findings: Secondary | ICD-10-CM

## 2023-06-15 DIAGNOSIS — Z0000 Encounter for general adult medical examination without abnormal findings: Secondary | ICD-10-CM

## 2023-06-15 DIAGNOSIS — E039 Hypothyroidism, unspecified: Secondary | ICD-10-CM

## 2023-06-15 DIAGNOSIS — I10 Essential (primary) hypertension: Secondary | ICD-10-CM | POA: Diagnosis not present

## 2023-06-15 DIAGNOSIS — E782 Mixed hyperlipidemia: Secondary | ICD-10-CM

## 2023-06-15 DIAGNOSIS — R7303 Prediabetes: Secondary | ICD-10-CM | POA: Diagnosis not present

## 2023-06-15 DIAGNOSIS — E538 Deficiency of other specified B group vitamins: Secondary | ICD-10-CM

## 2023-06-15 DIAGNOSIS — D485 Neoplasm of uncertain behavior of skin: Secondary | ICD-10-CM | POA: Diagnosis not present

## 2023-06-15 LAB — BAYER DCA HB A1C WAIVED: HB A1C (BAYER DCA - WAIVED): 5.4 % (ref 4.8–5.6)

## 2023-06-15 NOTE — Progress Notes (Signed)
 BP 125/70   Pulse 74   Temp 97.8 F (36.6 C) (Temporal)   Ht 6' 1 (1.854 m)   Wt 158 lb 9.6 oz (71.9 kg)   SpO2 98%   BMI 20.92 kg/m    Subjective:   Patient ID: James Thomas, male    DOB: 28-Sep-1934, 89 y.o.   MRN: [id number]  HPI: James Thomas is a 89 y.o. male presenting on 06/15/2023 for Annual Exam   HPI Physical exam Patient denies any chest pain, shortness of breath, headaches or vision issues, abdominal complaints, diarrhea, nausea, vomiting, or joint issues.   Hypertension Patient is currently on amlodipine  and losartan , and their blood pressure today is 125/70. Patient denies any lightheadedness or dizziness. Patient denies headaches, blurred vision, chest pains, shortness of breath, or weakness. Denies any side effects from medication and is content with current medication.   Hyperlipidemia Patient is coming in for recheck of his hyperlipidemia. The patient is currently taking pravastatin . They deny any issues with myalgias or history of liver damage from it. They deny any focal numbness or weakness or chest pain.   Prediabetes Patient comes in today for recheck of his diabetes. Patient has been currently taking no medication.. Patient is currently on an ACE inhibitor/ARB. Patient has not seen an ophthalmologist this year. Patient denies any new issues with their feet. The symptom started onset as an adult hypertension hyperlipidemia hypothyroidism ARE RELATED TO DM   Hypothyroidism recheck Patient is coming in for thyroid  recheck today as well. They deny any issues with hair changes or heat or cold problems or diarrhea or constipation. They deny any chest pain or palpitations. They are currently on levothyroxine  75 micrograms   B12 deficiency recheck Patient is coming in today for B12 deficiency recheck.  He has had some neuropathy diagnosed by neurology has been on B12.  Will recheck the levels today  Relevant past medical, surgical, family and social history  reviewed and updated as indicated. Interim medical history since our last visit reviewed. Allergies and medications reviewed and updated.  Review of Systems  Constitutional:  Negative for chills and fever.  Eyes:  Negative for visual disturbance.  Respiratory:  Negative for shortness of breath and wheezing.   Cardiovascular:  Negative for chest pain and leg swelling.  Musculoskeletal:  Negative for back pain and gait problem.  Skin:  Negative for rash.  Neurological:  Negative for dizziness and light-headedness.  All other systems reviewed and are negative.   Per HPI unless specifically indicated above   Allergies as of 06/15/2023       Reactions   Fenofibrate  Cough   Meloxicam    Unsure but thinks it gave him a rash   Niacin And Related Itching, Rash        Medication List        Accurate as of June 15, 2023  9:13 AM. If you have any questions, ask your nurse or doctor.          amLODipine  2.5 MG tablet Commonly known as: NORVASC  Take 1 tablet (2.5 mg total) by mouth daily. Take 2.5 and 5 mg together for a total of 7.5 mg   amLODipine  5 MG tablet Commonly known as: NORVASC  Take 1 tablet (5 mg total) by mouth daily. Take with 2.5 mg for total of 7.5 mg daily   B-12 500 MCG Tabs Take 1 tablet by mouth at bedtime.   CENTRUM SILVER 50+MEN PO Take 1 tablet by mouth daily.  cholecalciferol 1000 units tablet Commonly known as: VITAMIN D  Take 2,000 Units by mouth in the morning and at bedtime.   CITRACAL + D PO Take by mouth daily.   Fish Oil 1200 MG Caps Take 2 capsules by mouth daily.   levothyroxine  75 MCG tablet Commonly known as: SYNTHROID  Take 1 tablet (75 mcg total) by mouth daily.   losartan  100 MG tablet Commonly known as: COZAAR  Take 1 tablet (100 mg total) by mouth daily.   omeprazole  40 MG capsule Commonly known as: PRILOSEC Take 1 capsule (40 mg total) by mouth daily.   polyethylene glycol 17 g packet Commonly known as: MIRALAX /  GLYCOLAX Take 17 g by mouth at bedtime.   pravastatin  20 MG tablet Commonly known as: PRAVACHOL  Take 1 tablet by mouth once daily         Objective:   BP 125/70   Pulse 74   Temp 97.8 F (36.6 C) (Temporal)   Ht 6' 1 (1.854 m)   Wt 158 lb 9.6 oz (71.9 kg)   SpO2 98%   BMI 20.92 kg/m   Wt Readings from Last 3 Encounters:  06/15/23 158 lb 9.6 oz (71.9 kg)  02/03/23 158 lb (71.7 kg)  12/13/22 164 lb (74.4 kg)    Physical Exam Vitals and nursing note reviewed.  Constitutional:      General: He is not in acute distress.    Appearance: He is well-developed. He is not diaphoretic.  Eyes:     General: No scleral icterus.    Conjunctiva/sclera: Conjunctivae normal.  Neck:     Thyroid : No thyromegaly.  Cardiovascular:     Rate and Rhythm: Normal rate and regular rhythm.     Heart sounds: Normal heart sounds. No murmur heard. Pulmonary:     Effort: Pulmonary effort is normal. No respiratory distress.     Breath sounds: Normal breath sounds. No wheezing.  Musculoskeletal:        General: No swelling. Normal range of motion.     Cervical back: Neck supple.  Lymphadenopathy:     Cervical: No cervical adenopathy.  Skin:    General: Skin is warm and dry.     Findings: No rash.  Neurological:     Mental Status: He is alert and oriented to person, place, and time.     Coordination: Coordination normal.  Psychiatric:        Behavior: Behavior normal.       Assessment & Plan:   Problem List Items Addressed This Visit       Cardiovascular and Mediastinum   HTN (hypertension)   Relevant Orders   CBC with Differential/Platelet   CMP14+EGFR     Endocrine   Hypothyroid   Relevant Orders   TSH     Other   HLD (hyperlipidemia)   Relevant Orders   Lipid panel   Prediabetes   Relevant Orders   CBC with Differential/Platelet   CMP14+EGFR   Bayer DCA Hb A1c Waived   B12 deficiency   Relevant Orders   Vitamin B12   Other Visit Diagnoses       Physical  exam    -  Primary       Patient seems to be doing well.  Will check his blood work today.  Continue current medicine.  Blood pressure and everything else looks good.  No changes A1c looked good at 5.4. Follow up plan: Return in about 6 months (around 12/15/2023), or if symptoms worsen or fail to improve,  for htn and hld hypothy like hypothyroid.  Counseling provided for all of the vaccine components Orders Placed This Encounter  Procedures   CBC with Differential/Platelet   CMP14+EGFR   Lipid panel   TSH   Bayer DCA Hb A1c Waived   Vitamin B12    Jolyne Needs, MD [facility] 06/15/2023, 9:13 AM

## 2023-06-16 ENCOUNTER — Encounter: Payer: Self-pay | Admitting: Family Medicine

## 2023-06-16 DIAGNOSIS — I10 Essential (primary) hypertension: Secondary | ICD-10-CM

## 2023-06-16 LAB — CBC WITH DIFFERENTIAL/PLATELET
Basophils Absolute: 0.1 10*3/uL (ref 0.0–0.2)
Basos: 1 %
EOS (ABSOLUTE): 0.2 10*3/uL (ref 0.0–0.4)
Eos: 3 %
Hematocrit: 43.6 % (ref 37.5–51.0)
Hemoglobin: 13.5 g/dL (ref 13.0–17.7)
Immature Grans (Abs): 0 10*3/uL (ref 0.0–0.1)
Immature Granulocytes: 0 %
Lymphocytes Absolute: 1.4 10*3/uL (ref 0.7–3.1)
Lymphs: 18 %
MCH: 29.6 pg (ref 26.6–33.0)
MCHC: 31 g/dL — ABNORMAL LOW (ref 31.5–35.7)
MCV: 96 fL (ref 79–97)
Monocytes Absolute: 0.5 10*3/uL (ref 0.1–0.9)
Monocytes: 6 %
Neutrophils Absolute: 5.7 10*3/uL (ref 1.4–7.0)
Neutrophils: 72 %
Platelets: 254 10*3/uL (ref 150–450)
RBC: 4.56 x10E6/uL (ref 4.14–5.80)
RDW: 12.8 % (ref 11.6–15.4)
WBC: 7.9 10*3/uL (ref 3.4–10.8)

## 2023-06-16 LAB — CMP14+EGFR
ALT: 17 IU/L (ref 0–44)
AST: 18 IU/L (ref 0–40)
Albumin: 4.1 g/dL (ref 3.7–4.7)
Alkaline Phosphatase: 91 IU/L (ref 44–121)
BUN/Creatinine Ratio: 14 (ref 10–24)
BUN: 17 mg/dL (ref 8–27)
Bilirubin Total: 0.7 mg/dL (ref 0.0–1.2)
CO2: 22 mmol/L (ref 20–29)
Calcium: 9.5 mg/dL (ref 8.6–10.2)
Chloride: 104 mmol/L (ref 96–106)
Creatinine, Ser: 1.2 mg/dL (ref 0.76–1.27)
Globulin, Total: 1.9 g/dL (ref 1.5–4.5)
Glucose: 106 mg/dL — ABNORMAL HIGH (ref 70–99)
Potassium: 4 mmol/L (ref 3.5–5.2)
Sodium: 143 mmol/L (ref 134–144)
Total Protein: 6 g/dL (ref 6.0–8.5)
eGFR: 59 mL/min/{1.73_m2} — ABNORMAL LOW (ref >59)

## 2023-06-16 LAB — LIPID PANEL
Chol/HDL Ratio: 3.7 ratio (ref 0.0–5.0)
Cholesterol, Total: 133 mg/dL (ref 100–199)
HDL: 36 mg/dL — ABNORMAL LOW (ref >39)
LDL Chol Calc (NIH): 83 mg/dL (ref 0–99)
Triglycerides: 66 mg/dL (ref 0–149)
VLDL Cholesterol Cal: 14 mg/dL (ref 5–40)

## 2023-06-16 LAB — TSH: TSH: 2.15 u[IU]/mL (ref 0.450–4.500)

## 2023-06-16 LAB — VITAMIN B12: Vitamin B-12: 671 pg/mL (ref 232–1245)

## 2023-06-16 MED ORDER — AMLODIPINE BESYLATE 2.5 MG PO TABS: 2.5000 mg | ORAL_TABLET | Freq: Every day | ORAL | 3 refills | Status: AC

## 2023-06-20 ENCOUNTER — Ambulatory Visit: Payer: Self-pay | Admitting: Family Medicine

## 2023-07-07 DIAGNOSIS — C44329 Squamous cell carcinoma of skin of other parts of face: Secondary | ICD-10-CM | POA: Diagnosis not present

## 2023-07-26 DIAGNOSIS — M79674 Pain in right toe(s): Secondary | ICD-10-CM | POA: Diagnosis not present

## 2023-07-26 DIAGNOSIS — M79675 Pain in left toe(s): Secondary | ICD-10-CM | POA: Diagnosis not present

## 2023-07-26 DIAGNOSIS — B351 Tinea unguium: Secondary | ICD-10-CM | POA: Diagnosis not present

## 2023-09-06 ENCOUNTER — Other Ambulatory Visit: Payer: Self-pay | Admitting: Family Medicine

## 2023-09-06 DIAGNOSIS — E782 Mixed hyperlipidemia: Secondary | ICD-10-CM

## 2023-09-22 DIAGNOSIS — M170 Bilateral primary osteoarthritis of knee: Secondary | ICD-10-CM | POA: Diagnosis not present

## 2023-09-22 DIAGNOSIS — M25552 Pain in left hip: Secondary | ICD-10-CM | POA: Diagnosis not present

## 2023-11-08 ENCOUNTER — Encounter: Payer: Self-pay | Admitting: *Deleted

## 2023-12-05 DIAGNOSIS — Q828 Other specified congenital malformations of skin: Secondary | ICD-10-CM | POA: Diagnosis not present

## 2023-12-05 DIAGNOSIS — Z85828 Personal history of other malignant neoplasm of skin: Secondary | ICD-10-CM | POA: Diagnosis not present

## 2023-12-05 DIAGNOSIS — L570 Actinic keratosis: Secondary | ICD-10-CM | POA: Diagnosis not present

## 2023-12-15 ENCOUNTER — Ambulatory Visit

## 2023-12-16 ENCOUNTER — Ambulatory Visit: Payer: Self-pay | Admitting: Family Medicine

## 2024-01-12 ENCOUNTER — Ambulatory Visit (INDEPENDENT_AMBULATORY_CARE_PROVIDER_SITE_OTHER): Admitting: Family Medicine

## 2024-01-12 ENCOUNTER — Encounter: Payer: Self-pay | Admitting: Family Medicine

## 2024-01-12 VITALS — BP 131/60 | HR 75 | Ht 73.0 in | Wt 158.0 lb

## 2024-01-12 DIAGNOSIS — I10 Essential (primary) hypertension: Secondary | ICD-10-CM

## 2024-01-12 DIAGNOSIS — R7303 Prediabetes: Secondary | ICD-10-CM

## 2024-01-12 DIAGNOSIS — E782 Mixed hyperlipidemia: Secondary | ICD-10-CM | POA: Diagnosis not present

## 2024-01-12 DIAGNOSIS — E039 Hypothyroidism, unspecified: Secondary | ICD-10-CM

## 2024-01-12 DIAGNOSIS — Z23 Encounter for immunization: Secondary | ICD-10-CM | POA: Diagnosis not present

## 2024-01-12 LAB — BAYER DCA HB A1C WAIVED: HB A1C (BAYER DCA - WAIVED): 5.1 % (ref 4.8–5.6)

## 2024-01-12 MED ORDER — LEVOTHYROXINE SODIUM 75 MCG PO TABS: 75.0000 ug | ORAL_TABLET | Freq: Every day | ORAL | 3 refills | Status: AC

## 2024-01-12 MED ORDER — AMLODIPINE BESYLATE 5 MG PO TABS: 5.0000 mg | ORAL_TABLET | Freq: Every day | ORAL | 3 refills | Status: AC

## 2024-01-12 MED ORDER — OMEPRAZOLE 40 MG PO CPDR: 40.0000 mg | DELAYED_RELEASE_CAPSULE | Freq: Every day | ORAL | 3 refills | Status: AC

## 2024-01-12 MED ORDER — PRAVASTATIN SODIUM 20 MG PO TABS: 20.0000 mg | ORAL_TABLET | Freq: Every day | ORAL | 1 refills | Status: AC

## 2024-01-12 MED ORDER — LOSARTAN POTASSIUM 100 MG PO TABS: 100.0000 mg | ORAL_TABLET | Freq: Every day | ORAL | 3 refills | Status: AC

## 2024-01-12 NOTE — Progress Notes (Signed)
 "  BP 131/60   Pulse 75   Ht 6' 1 (1.854 m)   Wt 158 lb (71.7 kg)   SpO2 98%   BMI 20.85 kg/m    Subjective:   Patient ID: James Thomas, male    DOB: [birth year], 89 y.o.   MRN: [id number]  HPI: James Thomas is a 89 y.o. male presenting on 01/12/2024 for Medical Management of Chronic Issues, Hypertension, and Prediabetes   Discussed the use of AI scribe software for clinical note transcription with the patient, who gave verbal consent to proceed.  History of Present Illness   James Thomas is an 89 year old male with a history of thyroid  disorder, prediabetes, hyperlipidemia, and hypertension who presents for a routine follow-up visit.  Musculoskeletal pain and mobility - Persistent left thigh pain described as muscular in nature. - History of bilateral knee osteoarthritis ('bone on bone') with knee injections in September providing relief. - History of hip pain following a fall in the bedroom; x-rays showed no fractures and hip was in good condition. - Takes Tylenol Arthritis, two tablets in the morning, and occasionally Naprosyn with food for pain management. - Experiencing episodes of staggering, which correlate with increased pain and decreased physical activity. - Ambulates to mailbox and occasionally goes out with his brother, but activity is limited due to concerns about stability.  Thyroid  disorder - Thyroid  condition managed for over thirteen years. - No new symptoms related to thyroid  disorder. - Family history of thyroid  disease; daughter recently diagnosed with a thyroid  condition.  Prediabetes - No new symptoms or issues related to prediabetes.  Hyperlipidemia - Continues pravastatin  and fish oils for cholesterol management.  Hypertension - Continues amlodipine  and losartan  for blood pressure management.  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 - Lesion rechecked in December.           Relevant past medical, surgical, family and social history reviewed and updated as indicated. Interim medical history since our last visit reviewed. Allergies and medications reviewed and updated.  Review of Systems  Constitutional:  Negative for chills and fever.  Eyes:  Negative for discharge.  Respiratory:  Negative for shortness of breath and wheezing.   Cardiovascular:  Negative for chest pain and leg swelling.  Musculoskeletal:  Positive for arthralgias, gait problem and myalgias. Negative for back pain.  Skin:  Negative for rash.  All other systems reviewed and are negative.   Per HPI unless specifically indicated above   Allergies as of 01/12/2024       Reactions   Fenofibrate  Cough   Meloxicam    Unsure but thinks it gave him a rash   Niacin And Related Itching, Rash        Medication List        Accurate as of January 12, 2024  3:15 PM. If you have any questions, ask your nurse or doctor.          amLODipine  2.5 MG tablet Commonly known as: NORVASC  Take 1 tablet (2.5 mg total) by mouth daily. Take 2.5 and 5 mg together for a total of 7.5 mg   amLODipine  5 MG tablet Commonly known as: NORVASC  Take 1 tablet (5 mg total) by mouth daily. Take with 2.5 mg for total of 7.5 mg daily   B-12 500 MCG Tabs Take 1 tablet by mouth at bedtime.   CENTRUM SILVER 50+MEN PO Take 1 tablet by mouth daily.  cholecalciferol 1000 units tablet Commonly known as: VITAMIN D  Take 2,000 Units by mouth in the morning and at bedtime.   CITRACAL + D PO Take by mouth daily.   Fish Oil 1200 MG Caps Take 2 capsules by mouth daily.   levothyroxine  75 MCG tablet Commonly known as: SYNTHROID  Take 1 tablet (75 mcg total) by mouth daily.   losartan  100 MG tablet Commonly known as: COZAAR  Take 1 tablet (100 mg total) by mouth daily.   omeprazole  40 MG capsule Commonly known as: PRILOSEC Take 1 capsule (40 mg total) by mouth daily.   polyethylene glycol 17 g  packet Commonly known as: MIRALAX / GLYCOLAX Take 17 g by mouth at bedtime.   pravastatin  20 MG tablet Commonly known as: PRAVACHOL  Take 1 tablet (20 mg total) by mouth daily.         Objective:   BP 131/60   Pulse 75   Ht 6' 1 (1.854 m)   Wt 158 lb (71.7 kg)   SpO2 98%   BMI 20.85 kg/m   Wt Readings from Last 3 Encounters:  01/12/24 158 lb (71.7 kg)  06/15/23 158 lb 9.6 oz (71.9 kg)  02/03/23 158 lb (71.7 kg)    Physical Exam Musculoskeletal:        General: Tenderness present.     Left upper leg: Tenderness present.       Legs:    Physical Exam   VITALS: BP- 131/60 NECK: Thyroid  without nodules. CHEST: Lungs clear to auscultation bilaterally. CARDIOVASCULAR: Heart regular rate and rhythm, no murmurs. EXTREMITIES: Mild leg swelling.         Assessment & Plan:   Problem List Items Addressed This Visit       Cardiovascular and Mediastinum   HTN (hypertension)   Relevant Medications   amLODipine  (NORVASC ) 5 MG tablet   losartan  (COZAAR ) 100 MG tablet   pravastatin  (PRAVACHOL ) 20 MG tablet   Other Relevant Orders   CBC with Differential/Platelet   CMP14+EGFR     Endocrine   Hypothyroid   Relevant Medications   levothyroxine  (SYNTHROID ) 75 MCG tablet   Other Relevant Orders   TSH     Other   HLD (hyperlipidemia) - Primary   Relevant Medications   amLODipine  (NORVASC ) 5 MG tablet   losartan  (COZAAR ) 100 MG tablet   pravastatin  (PRAVACHOL ) 20 MG tablet   Other Relevant Orders   Lipid panel   Prediabetes   Relevant Orders   CBC with Differential/Platelet   CMP14+EGFR   Bayer DCA Hb A1c Waived       Acquired hypothyroidism Long-standing, well-controlled with medication. - Ordered thyroid  function tests with blood work.  Prediabetes Monitored with A1c testing. - Ordered A1c test with blood work.  Mixed hyperlipidemia Managed with pravastatin  and fish oils. - Ordered lipid panel with blood work.  Primary  hypertension Well-controlled with amlodipine  and losartan .  Bilateral knee osteoarthritis Bone-on-bone changes. Previous knee injections provided relief. - Will schedule knee injection.  Left thigh myalgia Likely muscular in origin. Possible nerve involvement from the back. - Continue Tylenol arthritis, up to six per day as needed. - Consider Naprosyn with food for additional pain relief.          Follow up plan: Return in about 6 months (around 07/11/2024), or if symptoms worsen or fail to improve, for Prediabetes hypertension and hypothyroid.  Counseling provided for all of the vaccine components Orders Placed This Encounter  Procedures   CBC with Differential/Platelet   CMP14+EGFR  Lipid panel   TSH   Bayer DCA Hb A1c Waived    Fonda Levins, MD [facility] 01/12/2024, 3:15 PM     "

## 2024-01-13 LAB — CBC WITH DIFFERENTIAL/PLATELET
Basophils Absolute: 0.1 x10E3/uL (ref 0.0–0.2)
Basos: 1 %
EOS (ABSOLUTE): 0.2 x10E3/uL (ref 0.0–0.4)
Eos: 3 %
Hematocrit: 39.3 % (ref 37.5–51.0)
Hemoglobin: 12.8 g/dL — ABNORMAL LOW (ref 13.0–17.7)
Immature Grans (Abs): 0 x10E3/uL (ref 0.0–0.1)
Immature Granulocytes: 0 %
Lymphocytes Absolute: 1.6 x10E3/uL (ref 0.7–3.1)
Lymphs: 22 %
MCH: 31.8 pg (ref 26.6–33.0)
MCHC: 32.6 g/dL (ref 31.5–35.7)
MCV: 98 fL — ABNORMAL HIGH (ref 79–97)
Monocytes Absolute: 0.6 x10E3/uL (ref 0.1–0.9)
Monocytes: 8 %
Neutrophils Absolute: 4.7 x10E3/uL (ref 1.4–7.0)
Neutrophils: 66 %
Platelets: 316 x10E3/uL (ref 150–450)
RBC: 4.03 x10E6/uL — ABNORMAL LOW (ref 4.14–5.80)
RDW: 12.2 % (ref 11.6–15.4)
WBC: 7.2 x10E3/uL (ref 3.4–10.8)

## 2024-01-13 LAB — CMP14+EGFR
ALT: 11 IU/L (ref 0–44)
AST: 18 IU/L (ref 0–40)
Albumin: 4.2 g/dL (ref 3.7–4.7)
Alkaline Phosphatase: 88 IU/L (ref 48–129)
BUN/Creatinine Ratio: 14 (ref 10–24)
BUN: 18 mg/dL (ref 8–27)
Bilirubin Total: 0.4 mg/dL (ref 0.0–1.2)
CO2: 24 mmol/L (ref 20–29)
Calcium: 9.3 mg/dL (ref 8.6–10.2)
Chloride: 105 mmol/L (ref 96–106)
Creatinine, Ser: 1.31 mg/dL — ABNORMAL HIGH (ref 0.76–1.27)
Globulin, Total: 2.4 g/dL (ref 1.5–4.5)
Glucose: 103 mg/dL — ABNORMAL HIGH (ref 70–99)
Potassium: 3.8 mmol/L (ref 3.5–5.2)
Sodium: 145 mmol/L — ABNORMAL HIGH (ref 134–144)
Total Protein: 6.6 g/dL (ref 6.0–8.5)
eGFR: 53 mL/min/1.73 — ABNORMAL LOW

## 2024-01-13 LAB — LIPID PANEL
Chol/HDL Ratio: 3.8 ratio (ref 0.0–5.0)
Cholesterol, Total: 147 mg/dL (ref 100–199)
HDL: 39 mg/dL — ABNORMAL LOW
LDL Chol Calc (NIH): 84 mg/dL (ref 0–99)
Triglycerides: 138 mg/dL (ref 0–149)
VLDL Cholesterol Cal: 24 mg/dL (ref 5–40)

## 2024-01-13 LAB — TSH: TSH: 1 u[IU]/mL (ref 0.450–4.500)

## 2024-01-20 ENCOUNTER — Ambulatory Visit: Payer: Self-pay | Admitting: Family Medicine

## 2024-07-11 ENCOUNTER — Ambulatory Visit: Admitting: Family Medicine
